# Patient Record
Sex: Female | Born: 1940
Health system: Southern US, Community
[De-identification: ages and names within clinical notes are randomized; demographics above are authoritative.]

## PROBLEM LIST (undated history)

## (undated) DIAGNOSIS — R011 Cardiac murmur, unspecified: Secondary | ICD-10-CM

## (undated) DIAGNOSIS — K222 Esophageal obstruction: Secondary | ICD-10-CM

## (undated) DIAGNOSIS — K219 Gastro-esophageal reflux disease without esophagitis: Secondary | ICD-10-CM

## (undated) DIAGNOSIS — R579 Shock, unspecified: Secondary | ICD-10-CM

## (undated) DIAGNOSIS — H353190 Nonexudative age-related macular degeneration, unspecified eye, stage unspecified: Secondary | ICD-10-CM

## (undated) DIAGNOSIS — N189 Chronic kidney disease, unspecified: Secondary | ICD-10-CM

## (undated) DIAGNOSIS — I471 Supraventricular tachycardia, unspecified: Secondary | ICD-10-CM

## (undated) DIAGNOSIS — I2510 Atherosclerotic heart disease of native coronary artery without angina pectoris: Secondary | ICD-10-CM

## (undated) DIAGNOSIS — N179 Acute kidney failure, unspecified: Secondary | ICD-10-CM

## (undated) DIAGNOSIS — R0600 Dyspnea, unspecified: Secondary | ICD-10-CM

## (undated) DIAGNOSIS — J189 Pneumonia, unspecified organism: Secondary | ICD-10-CM

## (undated) DIAGNOSIS — D638 Anemia in other chronic diseases classified elsewhere: Secondary | ICD-10-CM

## (undated) DIAGNOSIS — I509 Heart failure, unspecified: Secondary | ICD-10-CM

## (undated) DIAGNOSIS — E559 Vitamin D deficiency, unspecified: Secondary | ICD-10-CM

## (undated) DIAGNOSIS — I3139 Other pericardial effusion (noninflammatory): Secondary | ICD-10-CM

## (undated) DIAGNOSIS — N183 Chronic kidney disease, stage 3 unspecified: Secondary | ICD-10-CM

## (undated) DIAGNOSIS — F5104 Psychophysiologic insomnia: Secondary | ICD-10-CM

## (undated) DIAGNOSIS — M5030 Other cervical disc degeneration, unspecified cervical region: Secondary | ICD-10-CM

## (undated) DIAGNOSIS — I6990 Unspecified sequelae of unspecified cerebrovascular disease: Secondary | ICD-10-CM

## (undated) DIAGNOSIS — K449 Diaphragmatic hernia without obstruction or gangrene: Secondary | ICD-10-CM

## (undated) DIAGNOSIS — M8580 Other specified disorders of bone density and structure, unspecified site: Secondary | ICD-10-CM

## (undated) DIAGNOSIS — F419 Anxiety disorder, unspecified: Secondary | ICD-10-CM

## (undated) DIAGNOSIS — I499 Cardiac arrhythmia, unspecified: Secondary | ICD-10-CM

## (undated) DIAGNOSIS — G4730 Sleep apnea, unspecified: Secondary | ICD-10-CM

## (undated) DIAGNOSIS — I340 Nonrheumatic mitral (valve) insufficiency: Secondary | ICD-10-CM

## (undated) DIAGNOSIS — I10 Essential (primary) hypertension: Secondary | ICD-10-CM

## (undated) DIAGNOSIS — M810 Age-related osteoporosis without current pathological fracture: Secondary | ICD-10-CM

## (undated) DIAGNOSIS — M109 Gout, unspecified: Secondary | ICD-10-CM

## (undated) DIAGNOSIS — J45909 Unspecified asthma, uncomplicated: Secondary | ICD-10-CM

## (undated) HISTORY — DX: Other cervical disc degeneration, unspecified cervical region: M50.30

## (undated) HISTORY — DX: Shock, unspecified: R57.9

## (undated) HISTORY — PX: CATARACT EXTRACTION: SUR2

## (undated) HISTORY — DX: Diaphragmatic hernia without obstruction or gangrene: K44.9

## (undated) HISTORY — DX: Atherosclerotic heart disease of native coronary artery without angina pectoris: I25.10

## (undated) HISTORY — DX: Gastro-esophageal reflux disease without esophagitis: K21.9

## (undated) HISTORY — DX: Esophageal obstruction: K22.2

## (undated) HISTORY — DX: Other specified disorders of bone density and structure, unspecified site: M85.80

## (undated) HISTORY — DX: Nonrheumatic mitral (valve) insufficiency: I34.0

## (undated) HISTORY — DX: Acute kidney failure, unspecified: N17.9

## (undated) HISTORY — DX: Chronic kidney disease, unspecified: N18.9

## (undated) HISTORY — DX: Nonexudative age-related macular degeneration, unspecified eye, stage unspecified: H35.3190

## (undated) HISTORY — DX: Hypercalcemia: E83.52

## (undated) HISTORY — DX: Age-related osteoporosis without current pathological fracture: M81.0

## (undated) HISTORY — PX: CARDIAC CATHETERIZATION: SHX172

## (undated) HISTORY — DX: Chronic kidney disease, stage 3 unspecified: N18.30

## (undated) HISTORY — DX: Supraventricular tachycardia: I47.1

## (undated) HISTORY — DX: Psychophysiologic insomnia: F51.04

## (undated) HISTORY — PX: EYE SURGERY: SHX253

## (undated) HISTORY — PX: ERCP W/ SPHINCTEROTOMY AND BALLOON DILATION: SHX1524

## (undated) HISTORY — DX: Anemia in other chronic diseases classified elsewhere: D63.8

## (undated) HISTORY — DX: Unspecified sequelae of unspecified cerebrovascular disease: I69.90

## (undated) HISTORY — DX: Gout, unspecified: M10.9

## (undated) HISTORY — DX: Supraventricular tachycardia, unspecified: I47.10

## (undated) HISTORY — DX: Other pericardial effusion (noninflammatory): I31.39

## (undated) HISTORY — DX: Vitamin D deficiency, unspecified: E55.9

## (undated) HISTORY — DX: Anxiety disorder, unspecified: F41.9

## (undated) HISTORY — DX: Heart failure, unspecified: I50.9

---

## 2012-05-07 DIAGNOSIS — I6330 Cerebral infarction due to thrombosis of unspecified cerebral artery: Secondary | ICD-10-CM

## 2012-05-07 HISTORY — DX: Cerebral infarction due to thrombosis of unspecified cerebral artery: I63.30

## 2014-08-11 ENCOUNTER — Encounter (INDEPENDENT_AMBULATORY_CARE_PROVIDER_SITE_OTHER): Payer: Self-pay | Admitting: Ophthalmology

## 2014-08-12 ENCOUNTER — Encounter (INDEPENDENT_AMBULATORY_CARE_PROVIDER_SITE_OTHER): Payer: Commercial Managed Care - HMO | Admitting: Ophthalmology

## 2014-08-14 HISTORY — PX: HEMORRHOID SURGERY: SHX153

## 2014-08-17 ENCOUNTER — Encounter (INDEPENDENT_AMBULATORY_CARE_PROVIDER_SITE_OTHER): Payer: Commercial Managed Care - HMO | Admitting: Ophthalmology

## 2014-08-17 DIAGNOSIS — H34831 Tributary (branch) retinal vein occlusion, right eye: Secondary | ICD-10-CM

## 2014-08-17 DIAGNOSIS — H43813 Vitreous degeneration, bilateral: Secondary | ICD-10-CM | POA: Diagnosis not present

## 2014-08-17 DIAGNOSIS — I10 Essential (primary) hypertension: Secondary | ICD-10-CM

## 2014-08-17 DIAGNOSIS — H35033 Hypertensive retinopathy, bilateral: Secondary | ICD-10-CM

## 2014-08-17 DIAGNOSIS — H3531 Nonexudative age-related macular degeneration: Secondary | ICD-10-CM | POA: Diagnosis not present

## 2014-09-24 DIAGNOSIS — R531 Weakness: Secondary | ICD-10-CM | POA: Diagnosis not present

## 2014-09-24 DIAGNOSIS — E559 Vitamin D deficiency, unspecified: Secondary | ICD-10-CM | POA: Diagnosis not present

## 2014-09-24 DIAGNOSIS — D509 Iron deficiency anemia, unspecified: Secondary | ICD-10-CM | POA: Diagnosis not present

## 2014-09-24 DIAGNOSIS — F419 Anxiety disorder, unspecified: Secondary | ICD-10-CM | POA: Diagnosis not present

## 2014-09-24 DIAGNOSIS — Z681 Body mass index (BMI) 19 or less, adult: Secondary | ICD-10-CM | POA: Diagnosis not present

## 2014-09-24 DIAGNOSIS — D539 Nutritional anemia, unspecified: Secondary | ICD-10-CM | POA: Diagnosis not present

## 2014-09-24 DIAGNOSIS — R231 Pallor: Secondary | ICD-10-CM | POA: Diagnosis not present

## 2014-09-24 DIAGNOSIS — R17 Unspecified jaundice: Secondary | ICD-10-CM | POA: Diagnosis not present

## 2014-12-21 ENCOUNTER — Ambulatory Visit (INDEPENDENT_AMBULATORY_CARE_PROVIDER_SITE_OTHER): Payer: Commercial Managed Care - HMO | Admitting: Ophthalmology

## 2014-12-21 DIAGNOSIS — I10 Essential (primary) hypertension: Secondary | ICD-10-CM | POA: Diagnosis not present

## 2014-12-21 DIAGNOSIS — H34833 Tributary (branch) retinal vein occlusion, bilateral: Secondary | ICD-10-CM | POA: Diagnosis not present

## 2014-12-21 DIAGNOSIS — H3531 Nonexudative age-related macular degeneration: Secondary | ICD-10-CM | POA: Diagnosis not present

## 2014-12-21 DIAGNOSIS — H2513 Age-related nuclear cataract, bilateral: Secondary | ICD-10-CM | POA: Diagnosis not present

## 2014-12-21 DIAGNOSIS — H43813 Vitreous degeneration, bilateral: Secondary | ICD-10-CM

## 2014-12-21 DIAGNOSIS — H35033 Hypertensive retinopathy, bilateral: Secondary | ICD-10-CM | POA: Diagnosis not present

## 2015-02-16 DIAGNOSIS — Z79899 Other long term (current) drug therapy: Secondary | ICD-10-CM | POA: Diagnosis not present

## 2015-02-16 DIAGNOSIS — Z1389 Encounter for screening for other disorder: Secondary | ICD-10-CM | POA: Diagnosis not present

## 2015-02-16 DIAGNOSIS — I6340 Cerebral infarction due to embolism of unspecified cerebral artery: Secondary | ICD-10-CM | POA: Diagnosis not present

## 2015-02-16 DIAGNOSIS — E785 Hyperlipidemia, unspecified: Secondary | ICD-10-CM | POA: Diagnosis not present

## 2015-02-16 DIAGNOSIS — M8580 Other specified disorders of bone density and structure, unspecified site: Secondary | ICD-10-CM | POA: Diagnosis not present

## 2015-02-16 DIAGNOSIS — Z9181 History of falling: Secondary | ICD-10-CM | POA: Diagnosis not present

## 2015-02-16 DIAGNOSIS — I10 Essential (primary) hypertension: Secondary | ICD-10-CM | POA: Diagnosis not present

## 2015-02-16 DIAGNOSIS — Z139 Encounter for screening, unspecified: Secondary | ICD-10-CM | POA: Diagnosis not present

## 2015-02-16 DIAGNOSIS — I4891 Unspecified atrial fibrillation: Secondary | ICD-10-CM | POA: Diagnosis not present

## 2015-03-08 DIAGNOSIS — F419 Anxiety disorder, unspecified: Secondary | ICD-10-CM | POA: Diagnosis not present

## 2015-03-08 DIAGNOSIS — I10 Essential (primary) hypertension: Secondary | ICD-10-CM | POA: Diagnosis not present

## 2015-03-08 DIAGNOSIS — E785 Hyperlipidemia, unspecified: Secondary | ICD-10-CM | POA: Diagnosis not present

## 2015-03-08 DIAGNOSIS — Z681 Body mass index (BMI) 19 or less, adult: Secondary | ICD-10-CM | POA: Diagnosis not present

## 2015-03-08 DIAGNOSIS — I6340 Cerebral infarction due to embolism of unspecified cerebral artery: Secondary | ICD-10-CM | POA: Diagnosis not present

## 2015-03-08 DIAGNOSIS — I4891 Unspecified atrial fibrillation: Secondary | ICD-10-CM | POA: Diagnosis not present

## 2015-03-19 DIAGNOSIS — Z1231 Encounter for screening mammogram for malignant neoplasm of breast: Secondary | ICD-10-CM | POA: Diagnosis not present

## 2015-04-09 DIAGNOSIS — F419 Anxiety disorder, unspecified: Secondary | ICD-10-CM | POA: Diagnosis not present

## 2015-04-09 DIAGNOSIS — Z1389 Encounter for screening for other disorder: Secondary | ICD-10-CM | POA: Diagnosis not present

## 2015-04-09 DIAGNOSIS — Z681 Body mass index (BMI) 19 or less, adult: Secondary | ICD-10-CM | POA: Diagnosis not present

## 2015-07-23 DIAGNOSIS — R21 Rash and other nonspecific skin eruption: Secondary | ICD-10-CM | POA: Diagnosis not present

## 2015-07-23 DIAGNOSIS — F329 Major depressive disorder, single episode, unspecified: Secondary | ICD-10-CM | POA: Diagnosis not present

## 2015-07-30 DIAGNOSIS — Z23 Encounter for immunization: Secondary | ICD-10-CM | POA: Diagnosis not present

## 2015-08-26 DIAGNOSIS — I4891 Unspecified atrial fibrillation: Secondary | ICD-10-CM | POA: Diagnosis not present

## 2015-08-26 DIAGNOSIS — F419 Anxiety disorder, unspecified: Secondary | ICD-10-CM | POA: Diagnosis not present

## 2015-08-26 DIAGNOSIS — Z23 Encounter for immunization: Secondary | ICD-10-CM | POA: Diagnosis not present

## 2015-08-26 DIAGNOSIS — Z79899 Other long term (current) drug therapy: Secondary | ICD-10-CM | POA: Diagnosis not present

## 2015-08-26 DIAGNOSIS — I10 Essential (primary) hypertension: Secondary | ICD-10-CM | POA: Diagnosis not present

## 2015-08-26 DIAGNOSIS — E785 Hyperlipidemia, unspecified: Secondary | ICD-10-CM | POA: Diagnosis not present

## 2015-08-26 DIAGNOSIS — I6340 Cerebral infarction due to embolism of unspecified cerebral artery: Secondary | ICD-10-CM | POA: Diagnosis not present

## 2015-08-26 DIAGNOSIS — E559 Vitamin D deficiency, unspecified: Secondary | ICD-10-CM | POA: Diagnosis not present

## 2015-08-26 DIAGNOSIS — M8580 Other specified disorders of bone density and structure, unspecified site: Secondary | ICD-10-CM | POA: Diagnosis not present

## 2015-12-22 ENCOUNTER — Ambulatory Visit (INDEPENDENT_AMBULATORY_CARE_PROVIDER_SITE_OTHER): Payer: Commercial Managed Care - HMO | Admitting: Ophthalmology

## 2015-12-27 ENCOUNTER — Ambulatory Visit (INDEPENDENT_AMBULATORY_CARE_PROVIDER_SITE_OTHER): Payer: Commercial Managed Care - HMO | Admitting: Ophthalmology

## 2015-12-27 DIAGNOSIS — H43813 Vitreous degeneration, bilateral: Secondary | ICD-10-CM | POA: Diagnosis not present

## 2015-12-27 DIAGNOSIS — H348312 Tributary (branch) retinal vein occlusion, right eye, stable: Secondary | ICD-10-CM | POA: Diagnosis not present

## 2015-12-27 DIAGNOSIS — H35033 Hypertensive retinopathy, bilateral: Secondary | ICD-10-CM

## 2015-12-27 DIAGNOSIS — H2513 Age-related nuclear cataract, bilateral: Secondary | ICD-10-CM

## 2015-12-27 DIAGNOSIS — I10 Essential (primary) hypertension: Secondary | ICD-10-CM | POA: Diagnosis not present

## 2015-12-27 DIAGNOSIS — H353132 Nonexudative age-related macular degeneration, bilateral, intermediate dry stage: Secondary | ICD-10-CM | POA: Diagnosis not present

## 2016-01-28 DIAGNOSIS — H5203 Hypermetropia, bilateral: Secondary | ICD-10-CM | POA: Diagnosis not present

## 2016-01-28 DIAGNOSIS — H5210 Myopia, unspecified eye: Secondary | ICD-10-CM | POA: Diagnosis not present

## 2016-02-29 DIAGNOSIS — M50322 Other cervical disc degeneration at C5-C6 level: Secondary | ICD-10-CM | POA: Diagnosis not present

## 2016-02-29 DIAGNOSIS — M50323 Other cervical disc degeneration at C6-C7 level: Secondary | ICD-10-CM | POA: Diagnosis not present

## 2016-02-29 DIAGNOSIS — I4891 Unspecified atrial fibrillation: Secondary | ICD-10-CM | POA: Diagnosis not present

## 2016-02-29 DIAGNOSIS — M8580 Other specified disorders of bone density and structure, unspecified site: Secondary | ICD-10-CM | POA: Diagnosis not present

## 2016-02-29 DIAGNOSIS — I6340 Cerebral infarction due to embolism of unspecified cerebral artery: Secondary | ICD-10-CM | POA: Diagnosis not present

## 2016-02-29 DIAGNOSIS — E559 Vitamin D deficiency, unspecified: Secondary | ICD-10-CM | POA: Diagnosis not present

## 2016-02-29 DIAGNOSIS — Z79899 Other long term (current) drug therapy: Secondary | ICD-10-CM | POA: Diagnosis not present

## 2016-02-29 DIAGNOSIS — R0600 Dyspnea, unspecified: Secondary | ICD-10-CM | POA: Diagnosis not present

## 2016-02-29 DIAGNOSIS — R918 Other nonspecific abnormal finding of lung field: Secondary | ICD-10-CM | POA: Diagnosis not present

## 2016-02-29 DIAGNOSIS — Z681 Body mass index (BMI) 19 or less, adult: Secondary | ICD-10-CM | POA: Diagnosis not present

## 2016-02-29 DIAGNOSIS — I10 Essential (primary) hypertension: Secondary | ICD-10-CM | POA: Diagnosis not present

## 2016-02-29 DIAGNOSIS — E785 Hyperlipidemia, unspecified: Secondary | ICD-10-CM | POA: Diagnosis not present

## 2016-02-29 DIAGNOSIS — M542 Cervicalgia: Secondary | ICD-10-CM | POA: Diagnosis not present

## 2016-03-07 DIAGNOSIS — M542 Cervicalgia: Secondary | ICD-10-CM | POA: Diagnosis not present

## 2016-03-07 DIAGNOSIS — M5030 Other cervical disc degeneration, unspecified cervical region: Secondary | ICD-10-CM | POA: Diagnosis not present

## 2016-03-09 DIAGNOSIS — M542 Cervicalgia: Secondary | ICD-10-CM | POA: Diagnosis not present

## 2016-03-09 DIAGNOSIS — M5030 Other cervical disc degeneration, unspecified cervical region: Secondary | ICD-10-CM | POA: Diagnosis not present

## 2016-03-14 DIAGNOSIS — M5030 Other cervical disc degeneration, unspecified cervical region: Secondary | ICD-10-CM | POA: Diagnosis not present

## 2016-03-14 DIAGNOSIS — M542 Cervicalgia: Secondary | ICD-10-CM | POA: Diagnosis not present

## 2016-03-16 DIAGNOSIS — M5030 Other cervical disc degeneration, unspecified cervical region: Secondary | ICD-10-CM | POA: Diagnosis not present

## 2016-03-16 DIAGNOSIS — M542 Cervicalgia: Secondary | ICD-10-CM | POA: Diagnosis not present

## 2016-03-20 DIAGNOSIS — Z1231 Encounter for screening mammogram for malignant neoplasm of breast: Secondary | ICD-10-CM | POA: Diagnosis not present

## 2016-03-21 DIAGNOSIS — I10 Essential (primary) hypertension: Secondary | ICD-10-CM | POA: Diagnosis not present

## 2016-03-21 DIAGNOSIS — M5030 Other cervical disc degeneration, unspecified cervical region: Secondary | ICD-10-CM | POA: Diagnosis not present

## 2016-03-21 DIAGNOSIS — R636 Underweight: Secondary | ICD-10-CM | POA: Diagnosis not present

## 2016-03-21 DIAGNOSIS — Z681 Body mass index (BMI) 19 or less, adult: Secondary | ICD-10-CM | POA: Diagnosis not present

## 2016-03-21 DIAGNOSIS — J189 Pneumonia, unspecified organism: Secondary | ICD-10-CM | POA: Diagnosis not present

## 2016-03-23 DIAGNOSIS — M542 Cervicalgia: Secondary | ICD-10-CM | POA: Diagnosis not present

## 2016-03-23 DIAGNOSIS — M5030 Other cervical disc degeneration, unspecified cervical region: Secondary | ICD-10-CM | POA: Diagnosis not present

## 2016-03-28 DIAGNOSIS — M5030 Other cervical disc degeneration, unspecified cervical region: Secondary | ICD-10-CM | POA: Diagnosis not present

## 2016-03-28 DIAGNOSIS — M542 Cervicalgia: Secondary | ICD-10-CM | POA: Diagnosis not present

## 2016-03-30 DIAGNOSIS — M5030 Other cervical disc degeneration, unspecified cervical region: Secondary | ICD-10-CM | POA: Diagnosis not present

## 2016-03-30 DIAGNOSIS — M542 Cervicalgia: Secondary | ICD-10-CM | POA: Diagnosis not present

## 2016-04-04 DIAGNOSIS — M5030 Other cervical disc degeneration, unspecified cervical region: Secondary | ICD-10-CM | POA: Diagnosis not present

## 2016-04-04 DIAGNOSIS — M542 Cervicalgia: Secondary | ICD-10-CM | POA: Diagnosis not present

## 2016-04-06 DIAGNOSIS — M542 Cervicalgia: Secondary | ICD-10-CM | POA: Diagnosis not present

## 2016-04-06 DIAGNOSIS — M5030 Other cervical disc degeneration, unspecified cervical region: Secondary | ICD-10-CM | POA: Diagnosis not present

## 2016-04-11 DIAGNOSIS — M542 Cervicalgia: Secondary | ICD-10-CM | POA: Diagnosis not present

## 2016-04-11 DIAGNOSIS — M5030 Other cervical disc degeneration, unspecified cervical region: Secondary | ICD-10-CM | POA: Diagnosis not present

## 2016-04-14 DIAGNOSIS — M5030 Other cervical disc degeneration, unspecified cervical region: Secondary | ICD-10-CM | POA: Diagnosis not present

## 2016-04-14 DIAGNOSIS — M542 Cervicalgia: Secondary | ICD-10-CM | POA: Diagnosis not present

## 2016-04-19 DIAGNOSIS — M5030 Other cervical disc degeneration, unspecified cervical region: Secondary | ICD-10-CM | POA: Diagnosis not present

## 2016-04-19 DIAGNOSIS — M542 Cervicalgia: Secondary | ICD-10-CM | POA: Diagnosis not present

## 2016-05-04 DIAGNOSIS — M5030 Other cervical disc degeneration, unspecified cervical region: Secondary | ICD-10-CM | POA: Diagnosis not present

## 2016-05-04 DIAGNOSIS — Z681 Body mass index (BMI) 19 or less, adult: Secondary | ICD-10-CM | POA: Diagnosis not present

## 2016-05-09 DIAGNOSIS — M4802 Spinal stenosis, cervical region: Secondary | ICD-10-CM | POA: Diagnosis not present

## 2016-05-09 DIAGNOSIS — M5030 Other cervical disc degeneration, unspecified cervical region: Secondary | ICD-10-CM | POA: Diagnosis not present

## 2016-05-10 DIAGNOSIS — M5412 Radiculopathy, cervical region: Secondary | ICD-10-CM | POA: Diagnosis not present

## 2016-05-10 DIAGNOSIS — M7540 Impingement syndrome of unspecified shoulder: Secondary | ICD-10-CM | POA: Diagnosis not present

## 2016-06-21 DIAGNOSIS — M5412 Radiculopathy, cervical region: Secondary | ICD-10-CM | POA: Diagnosis not present

## 2016-07-11 DIAGNOSIS — M791 Myalgia: Secondary | ICD-10-CM | POA: Diagnosis not present

## 2016-07-11 DIAGNOSIS — J208 Acute bronchitis due to other specified organisms: Secondary | ICD-10-CM | POA: Diagnosis not present

## 2016-07-11 DIAGNOSIS — R110 Nausea: Secondary | ICD-10-CM | POA: Diagnosis not present

## 2016-07-11 DIAGNOSIS — D509 Iron deficiency anemia, unspecified: Secondary | ICD-10-CM | POA: Diagnosis not present

## 2016-07-11 DIAGNOSIS — Z681 Body mass index (BMI) 19 or less, adult: Secondary | ICD-10-CM | POA: Diagnosis not present

## 2016-07-14 DIAGNOSIS — M50323 Other cervical disc degeneration at C6-C7 level: Secondary | ICD-10-CM | POA: Diagnosis not present

## 2016-07-14 DIAGNOSIS — M50322 Other cervical disc degeneration at C5-C6 level: Secondary | ICD-10-CM | POA: Diagnosis not present

## 2016-07-14 DIAGNOSIS — M5412 Radiculopathy, cervical region: Secondary | ICD-10-CM | POA: Diagnosis not present

## 2016-08-01 DIAGNOSIS — J208 Acute bronchitis due to other specified organisms: Secondary | ICD-10-CM | POA: Diagnosis not present

## 2016-08-01 DIAGNOSIS — Z681 Body mass index (BMI) 19 or less, adult: Secondary | ICD-10-CM | POA: Diagnosis not present

## 2016-08-02 DIAGNOSIS — J40 Bronchitis, not specified as acute or chronic: Secondary | ICD-10-CM | POA: Diagnosis not present

## 2016-08-02 DIAGNOSIS — J208 Acute bronchitis due to other specified organisms: Secondary | ICD-10-CM | POA: Diagnosis not present

## 2016-08-10 DIAGNOSIS — J208 Acute bronchitis due to other specified organisms: Secondary | ICD-10-CM | POA: Diagnosis not present

## 2016-08-10 DIAGNOSIS — I5032 Chronic diastolic (congestive) heart failure: Secondary | ICD-10-CM | POA: Diagnosis not present

## 2016-08-10 DIAGNOSIS — Z681 Body mass index (BMI) 19 or less, adult: Secondary | ICD-10-CM | POA: Diagnosis not present

## 2016-08-10 DIAGNOSIS — I4891 Unspecified atrial fibrillation: Secondary | ICD-10-CM | POA: Diagnosis not present

## 2016-08-10 DIAGNOSIS — E538 Deficiency of other specified B group vitamins: Secondary | ICD-10-CM | POA: Diagnosis not present

## 2016-08-10 DIAGNOSIS — R0600 Dyspnea, unspecified: Secondary | ICD-10-CM | POA: Diagnosis not present

## 2016-08-10 DIAGNOSIS — Z79899 Other long term (current) drug therapy: Secondary | ICD-10-CM | POA: Diagnosis not present

## 2016-08-10 DIAGNOSIS — R5383 Other fatigue: Secondary | ICD-10-CM | POA: Diagnosis not present

## 2016-08-16 DIAGNOSIS — R05 Cough: Secondary | ICD-10-CM | POA: Diagnosis not present

## 2016-08-16 DIAGNOSIS — I5032 Chronic diastolic (congestive) heart failure: Secondary | ICD-10-CM | POA: Diagnosis not present

## 2016-08-16 DIAGNOSIS — M5412 Radiculopathy, cervical region: Secondary | ICD-10-CM | POA: Diagnosis not present

## 2016-08-16 DIAGNOSIS — Z681 Body mass index (BMI) 19 or less, adult: Secondary | ICD-10-CM | POA: Diagnosis not present

## 2016-08-16 DIAGNOSIS — I517 Cardiomegaly: Secondary | ICD-10-CM | POA: Diagnosis not present

## 2016-08-16 DIAGNOSIS — S301XXA Contusion of abdominal wall, initial encounter: Secondary | ICD-10-CM | POA: Diagnosis not present

## 2016-08-23 DIAGNOSIS — I6523 Occlusion and stenosis of bilateral carotid arteries: Secondary | ICD-10-CM | POA: Diagnosis not present

## 2016-08-23 DIAGNOSIS — Z8673 Personal history of transient ischemic attack (TIA), and cerebral infarction without residual deficits: Secondary | ICD-10-CM | POA: Diagnosis not present

## 2016-08-23 DIAGNOSIS — I482 Chronic atrial fibrillation: Secondary | ICD-10-CM | POA: Diagnosis not present

## 2016-08-23 DIAGNOSIS — I10 Essential (primary) hypertension: Secondary | ICD-10-CM | POA: Diagnosis not present

## 2016-08-23 DIAGNOSIS — R079 Chest pain, unspecified: Secondary | ICD-10-CM | POA: Diagnosis not present

## 2016-08-23 DIAGNOSIS — R29810 Facial weakness: Secondary | ICD-10-CM | POA: Diagnosis not present

## 2016-08-23 DIAGNOSIS — I6350 Cerebral infarction due to unspecified occlusion or stenosis of unspecified cerebral artery: Secondary | ICD-10-CM | POA: Diagnosis not present

## 2016-08-23 DIAGNOSIS — R4781 Slurred speech: Secondary | ICD-10-CM | POA: Diagnosis not present

## 2016-08-23 DIAGNOSIS — I110 Hypertensive heart disease with heart failure: Secondary | ICD-10-CM | POA: Diagnosis not present

## 2016-08-23 DIAGNOSIS — I4891 Unspecified atrial fibrillation: Secondary | ICD-10-CM | POA: Diagnosis not present

## 2016-08-23 DIAGNOSIS — E785 Hyperlipidemia, unspecified: Secondary | ICD-10-CM | POA: Diagnosis not present

## 2016-08-23 DIAGNOSIS — I671 Cerebral aneurysm, nonruptured: Secondary | ICD-10-CM | POA: Diagnosis not present

## 2016-08-23 DIAGNOSIS — I252 Old myocardial infarction: Secondary | ICD-10-CM | POA: Diagnosis not present

## 2016-08-23 DIAGNOSIS — I6789 Other cerebrovascular disease: Secondary | ICD-10-CM | POA: Diagnosis not present

## 2016-08-23 DIAGNOSIS — R7989 Other specified abnormal findings of blood chemistry: Secondary | ICD-10-CM | POA: Diagnosis not present

## 2016-08-23 DIAGNOSIS — I639 Cerebral infarction, unspecified: Secondary | ICD-10-CM | POA: Diagnosis not present

## 2016-08-23 DIAGNOSIS — I509 Heart failure, unspecified: Secondary | ICD-10-CM | POA: Diagnosis not present

## 2016-08-23 DIAGNOSIS — Z66 Do not resuscitate: Secondary | ICD-10-CM | POA: Diagnosis not present

## 2016-08-28 DIAGNOSIS — R0600 Dyspnea, unspecified: Secondary | ICD-10-CM | POA: Insufficient documentation

## 2016-08-28 DIAGNOSIS — E785 Hyperlipidemia, unspecified: Secondary | ICD-10-CM | POA: Diagnosis not present

## 2016-08-28 DIAGNOSIS — J189 Pneumonia, unspecified organism: Secondary | ICD-10-CM | POA: Insufficient documentation

## 2016-08-28 DIAGNOSIS — I482 Chronic atrial fibrillation, unspecified: Secondary | ICD-10-CM

## 2016-08-28 DIAGNOSIS — Z79899 Other long term (current) drug therapy: Secondary | ICD-10-CM

## 2016-08-28 DIAGNOSIS — I10 Essential (primary) hypertension: Secondary | ICD-10-CM | POA: Insufficient documentation

## 2016-08-28 DIAGNOSIS — I119 Hypertensive heart disease without heart failure: Secondary | ICD-10-CM | POA: Insufficient documentation

## 2016-08-28 DIAGNOSIS — I509 Heart failure, unspecified: Secondary | ICD-10-CM | POA: Diagnosis not present

## 2016-08-28 DIAGNOSIS — I6350 Cerebral infarction due to unspecified occlusion or stenosis of unspecified cerebral artery: Secondary | ICD-10-CM | POA: Diagnosis not present

## 2016-08-28 DIAGNOSIS — R7989 Other specified abnormal findings of blood chemistry: Secondary | ICD-10-CM | POA: Diagnosis not present

## 2016-08-28 DIAGNOSIS — Z7901 Long term (current) use of anticoagulants: Secondary | ICD-10-CM

## 2016-08-28 DIAGNOSIS — I110 Hypertensive heart disease with heart failure: Secondary | ICD-10-CM | POA: Diagnosis not present

## 2016-08-28 DIAGNOSIS — I252 Old myocardial infarction: Secondary | ICD-10-CM | POA: Diagnosis not present

## 2016-08-28 DIAGNOSIS — Z66 Do not resuscitate: Secondary | ICD-10-CM | POA: Diagnosis not present

## 2016-08-28 DIAGNOSIS — Z8673 Personal history of transient ischemic attack (TIA), and cerebral infarction without residual deficits: Secondary | ICD-10-CM | POA: Diagnosis not present

## 2016-08-28 DIAGNOSIS — E7849 Other hyperlipidemia: Secondary | ICD-10-CM

## 2016-08-28 HISTORY — DX: Hypertensive heart disease without heart failure: I11.9

## 2016-08-28 HISTORY — DX: Other hyperlipidemia: E78.49

## 2016-08-28 HISTORY — DX: Other long term (current) drug therapy: Z79.899

## 2016-08-28 HISTORY — DX: Chronic atrial fibrillation, unspecified: I48.20

## 2016-08-28 HISTORY — DX: Long term (current) use of anticoagulants: Z79.01

## 2016-08-29 DIAGNOSIS — Z79899 Other long term (current) drug therapy: Secondary | ICD-10-CM | POA: Diagnosis not present

## 2016-08-29 DIAGNOSIS — I69322 Dysarthria following cerebral infarction: Secondary | ICD-10-CM | POA: Diagnosis not present

## 2016-08-29 DIAGNOSIS — E785 Hyperlipidemia, unspecified: Secondary | ICD-10-CM | POA: Diagnosis not present

## 2016-08-29 DIAGNOSIS — Z5181 Encounter for therapeutic drug level monitoring: Secondary | ICD-10-CM | POA: Diagnosis not present

## 2016-08-29 DIAGNOSIS — F329 Major depressive disorder, single episode, unspecified: Secondary | ICD-10-CM | POA: Diagnosis not present

## 2016-08-29 DIAGNOSIS — I10 Essential (primary) hypertension: Secondary | ICD-10-CM | POA: Diagnosis not present

## 2016-08-29 DIAGNOSIS — I4891 Unspecified atrial fibrillation: Secondary | ICD-10-CM | POA: Diagnosis not present

## 2016-08-29 DIAGNOSIS — Z7901 Long term (current) use of anticoagulants: Secondary | ICD-10-CM | POA: Diagnosis not present

## 2016-08-29 DIAGNOSIS — I509 Heart failure, unspecified: Secondary | ICD-10-CM | POA: Diagnosis not present

## 2016-08-29 DIAGNOSIS — R1312 Dysphagia, oropharyngeal phase: Secondary | ICD-10-CM | POA: Diagnosis not present

## 2016-08-29 DIAGNOSIS — I69391 Dysphagia following cerebral infarction: Secondary | ICD-10-CM | POA: Diagnosis not present

## 2016-08-29 DIAGNOSIS — I482 Chronic atrial fibrillation: Secondary | ICD-10-CM | POA: Diagnosis not present

## 2016-09-01 DIAGNOSIS — I69322 Dysarthria following cerebral infarction: Secondary | ICD-10-CM | POA: Diagnosis not present

## 2016-09-01 DIAGNOSIS — E785 Hyperlipidemia, unspecified: Secondary | ICD-10-CM | POA: Diagnosis not present

## 2016-09-01 DIAGNOSIS — I5032 Chronic diastolic (congestive) heart failure: Secondary | ICD-10-CM | POA: Diagnosis not present

## 2016-09-01 DIAGNOSIS — I10 Essential (primary) hypertension: Secondary | ICD-10-CM | POA: Diagnosis not present

## 2016-09-01 DIAGNOSIS — I4891 Unspecified atrial fibrillation: Secondary | ICD-10-CM | POA: Diagnosis not present

## 2016-09-01 DIAGNOSIS — I69391 Dysphagia following cerebral infarction: Secondary | ICD-10-CM | POA: Diagnosis not present

## 2016-09-01 DIAGNOSIS — Z79899 Other long term (current) drug therapy: Secondary | ICD-10-CM | POA: Diagnosis not present

## 2016-09-01 DIAGNOSIS — R791 Abnormal coagulation profile: Secondary | ICD-10-CM | POA: Diagnosis not present

## 2016-09-01 DIAGNOSIS — Z5181 Encounter for therapeutic drug level monitoring: Secondary | ICD-10-CM | POA: Diagnosis not present

## 2016-09-01 DIAGNOSIS — I482 Chronic atrial fibrillation: Secondary | ICD-10-CM | POA: Diagnosis not present

## 2016-09-01 DIAGNOSIS — F329 Major depressive disorder, single episode, unspecified: Secondary | ICD-10-CM | POA: Diagnosis not present

## 2016-09-01 DIAGNOSIS — Z681 Body mass index (BMI) 19 or less, adult: Secondary | ICD-10-CM | POA: Diagnosis not present

## 2016-09-01 DIAGNOSIS — I63511 Cerebral infarction due to unspecified occlusion or stenosis of right middle cerebral artery: Secondary | ICD-10-CM | POA: Diagnosis not present

## 2016-09-01 DIAGNOSIS — R1312 Dysphagia, oropharyngeal phase: Secondary | ICD-10-CM | POA: Diagnosis not present

## 2016-09-01 DIAGNOSIS — Z7901 Long term (current) use of anticoagulants: Secondary | ICD-10-CM | POA: Diagnosis not present

## 2016-09-04 DIAGNOSIS — F329 Major depressive disorder, single episode, unspecified: Secondary | ICD-10-CM | POA: Diagnosis not present

## 2016-09-04 DIAGNOSIS — I10 Essential (primary) hypertension: Secondary | ICD-10-CM | POA: Diagnosis not present

## 2016-09-04 DIAGNOSIS — I63511 Cerebral infarction due to unspecified occlusion or stenosis of right middle cerebral artery: Secondary | ICD-10-CM | POA: Diagnosis not present

## 2016-09-04 DIAGNOSIS — I4891 Unspecified atrial fibrillation: Secondary | ICD-10-CM | POA: Diagnosis not present

## 2016-09-04 DIAGNOSIS — Z5181 Encounter for therapeutic drug level monitoring: Secondary | ICD-10-CM | POA: Diagnosis not present

## 2016-09-04 DIAGNOSIS — I482 Chronic atrial fibrillation: Secondary | ICD-10-CM | POA: Diagnosis not present

## 2016-09-04 DIAGNOSIS — R1312 Dysphagia, oropharyngeal phase: Secondary | ICD-10-CM | POA: Diagnosis not present

## 2016-09-04 DIAGNOSIS — R791 Abnormal coagulation profile: Secondary | ICD-10-CM | POA: Diagnosis not present

## 2016-09-04 DIAGNOSIS — E785 Hyperlipidemia, unspecified: Secondary | ICD-10-CM | POA: Diagnosis not present

## 2016-09-04 DIAGNOSIS — K5900 Constipation, unspecified: Secondary | ICD-10-CM | POA: Diagnosis not present

## 2016-09-04 DIAGNOSIS — Z681 Body mass index (BMI) 19 or less, adult: Secondary | ICD-10-CM | POA: Diagnosis not present

## 2016-09-04 DIAGNOSIS — Z7901 Long term (current) use of anticoagulants: Secondary | ICD-10-CM | POA: Diagnosis not present

## 2016-09-04 DIAGNOSIS — I69391 Dysphagia following cerebral infarction: Secondary | ICD-10-CM | POA: Diagnosis not present

## 2016-09-04 DIAGNOSIS — I69322 Dysarthria following cerebral infarction: Secondary | ICD-10-CM | POA: Diagnosis not present

## 2016-09-05 DIAGNOSIS — R791 Abnormal coagulation profile: Secondary | ICD-10-CM | POA: Diagnosis not present

## 2016-09-06 DIAGNOSIS — Z7901 Long term (current) use of anticoagulants: Secondary | ICD-10-CM | POA: Diagnosis not present

## 2016-09-06 DIAGNOSIS — I482 Chronic atrial fibrillation: Secondary | ICD-10-CM | POA: Diagnosis not present

## 2016-09-06 DIAGNOSIS — F329 Major depressive disorder, single episode, unspecified: Secondary | ICD-10-CM | POA: Diagnosis not present

## 2016-09-06 DIAGNOSIS — Z5181 Encounter for therapeutic drug level monitoring: Secondary | ICD-10-CM | POA: Diagnosis not present

## 2016-09-06 DIAGNOSIS — I69322 Dysarthria following cerebral infarction: Secondary | ICD-10-CM | POA: Diagnosis not present

## 2016-09-06 DIAGNOSIS — I10 Essential (primary) hypertension: Secondary | ICD-10-CM | POA: Diagnosis not present

## 2016-09-06 DIAGNOSIS — R1312 Dysphagia, oropharyngeal phase: Secondary | ICD-10-CM | POA: Diagnosis not present

## 2016-09-06 DIAGNOSIS — E785 Hyperlipidemia, unspecified: Secondary | ICD-10-CM | POA: Diagnosis not present

## 2016-09-06 DIAGNOSIS — I69391 Dysphagia following cerebral infarction: Secondary | ICD-10-CM | POA: Diagnosis not present

## 2016-09-07 DIAGNOSIS — I482 Chronic atrial fibrillation: Secondary | ICD-10-CM | POA: Diagnosis not present

## 2016-09-07 DIAGNOSIS — F329 Major depressive disorder, single episode, unspecified: Secondary | ICD-10-CM | POA: Diagnosis not present

## 2016-09-07 DIAGNOSIS — R1312 Dysphagia, oropharyngeal phase: Secondary | ICD-10-CM | POA: Diagnosis not present

## 2016-09-07 DIAGNOSIS — E785 Hyperlipidemia, unspecified: Secondary | ICD-10-CM | POA: Diagnosis not present

## 2016-09-07 DIAGNOSIS — Z7901 Long term (current) use of anticoagulants: Secondary | ICD-10-CM | POA: Diagnosis not present

## 2016-09-07 DIAGNOSIS — I10 Essential (primary) hypertension: Secondary | ICD-10-CM | POA: Diagnosis not present

## 2016-09-07 DIAGNOSIS — I69322 Dysarthria following cerebral infarction: Secondary | ICD-10-CM | POA: Diagnosis not present

## 2016-09-07 DIAGNOSIS — I69391 Dysphagia following cerebral infarction: Secondary | ICD-10-CM | POA: Diagnosis not present

## 2016-09-07 DIAGNOSIS — Z5181 Encounter for therapeutic drug level monitoring: Secondary | ICD-10-CM | POA: Diagnosis not present

## 2016-09-08 DIAGNOSIS — Z5181 Encounter for therapeutic drug level monitoring: Secondary | ICD-10-CM | POA: Diagnosis not present

## 2016-09-08 DIAGNOSIS — Z7901 Long term (current) use of anticoagulants: Secondary | ICD-10-CM | POA: Diagnosis not present

## 2016-09-08 DIAGNOSIS — I482 Chronic atrial fibrillation: Secondary | ICD-10-CM | POA: Diagnosis not present

## 2016-09-08 DIAGNOSIS — I69322 Dysarthria following cerebral infarction: Secondary | ICD-10-CM | POA: Diagnosis not present

## 2016-09-08 DIAGNOSIS — I69391 Dysphagia following cerebral infarction: Secondary | ICD-10-CM | POA: Diagnosis not present

## 2016-09-08 DIAGNOSIS — R1312 Dysphagia, oropharyngeal phase: Secondary | ICD-10-CM | POA: Diagnosis not present

## 2016-09-08 DIAGNOSIS — E785 Hyperlipidemia, unspecified: Secondary | ICD-10-CM | POA: Diagnosis not present

## 2016-09-08 DIAGNOSIS — F329 Major depressive disorder, single episode, unspecified: Secondary | ICD-10-CM | POA: Diagnosis not present

## 2016-09-08 DIAGNOSIS — I10 Essential (primary) hypertension: Secondary | ICD-10-CM | POA: Diagnosis not present

## 2016-09-09 DIAGNOSIS — R791 Abnormal coagulation profile: Secondary | ICD-10-CM | POA: Diagnosis not present

## 2016-09-11 DIAGNOSIS — I69391 Dysphagia following cerebral infarction: Secondary | ICD-10-CM | POA: Diagnosis not present

## 2016-09-11 DIAGNOSIS — Z5181 Encounter for therapeutic drug level monitoring: Secondary | ICD-10-CM | POA: Diagnosis not present

## 2016-09-11 DIAGNOSIS — Z7901 Long term (current) use of anticoagulants: Secondary | ICD-10-CM | POA: Diagnosis not present

## 2016-09-11 DIAGNOSIS — R1312 Dysphagia, oropharyngeal phase: Secondary | ICD-10-CM | POA: Diagnosis not present

## 2016-09-11 DIAGNOSIS — E785 Hyperlipidemia, unspecified: Secondary | ICD-10-CM | POA: Diagnosis not present

## 2016-09-11 DIAGNOSIS — I69322 Dysarthria following cerebral infarction: Secondary | ICD-10-CM | POA: Diagnosis not present

## 2016-09-11 DIAGNOSIS — I10 Essential (primary) hypertension: Secondary | ICD-10-CM | POA: Diagnosis not present

## 2016-09-11 DIAGNOSIS — F329 Major depressive disorder, single episode, unspecified: Secondary | ICD-10-CM | POA: Diagnosis not present

## 2016-09-11 DIAGNOSIS — I482 Chronic atrial fibrillation: Secondary | ICD-10-CM | POA: Diagnosis not present

## 2016-09-12 DIAGNOSIS — I480 Paroxysmal atrial fibrillation: Secondary | ICD-10-CM | POA: Diagnosis not present

## 2016-09-12 DIAGNOSIS — E785 Hyperlipidemia, unspecified: Secondary | ICD-10-CM | POA: Diagnosis not present

## 2016-09-12 DIAGNOSIS — I69322 Dysarthria following cerebral infarction: Secondary | ICD-10-CM | POA: Diagnosis not present

## 2016-09-12 DIAGNOSIS — I482 Chronic atrial fibrillation: Secondary | ICD-10-CM | POA: Diagnosis not present

## 2016-09-12 DIAGNOSIS — Z5181 Encounter for therapeutic drug level monitoring: Secondary | ICD-10-CM | POA: Diagnosis not present

## 2016-09-12 DIAGNOSIS — F329 Major depressive disorder, single episode, unspecified: Secondary | ICD-10-CM | POA: Diagnosis not present

## 2016-09-12 DIAGNOSIS — Z7901 Long term (current) use of anticoagulants: Secondary | ICD-10-CM | POA: Diagnosis not present

## 2016-09-12 DIAGNOSIS — I69391 Dysphagia following cerebral infarction: Secondary | ICD-10-CM | POA: Diagnosis not present

## 2016-09-12 DIAGNOSIS — R1312 Dysphagia, oropharyngeal phase: Secondary | ICD-10-CM | POA: Diagnosis not present

## 2016-09-12 DIAGNOSIS — I10 Essential (primary) hypertension: Secondary | ICD-10-CM | POA: Diagnosis not present

## 2016-09-13 DIAGNOSIS — Z7901 Long term (current) use of anticoagulants: Secondary | ICD-10-CM | POA: Diagnosis not present

## 2016-09-13 DIAGNOSIS — I482 Chronic atrial fibrillation: Secondary | ICD-10-CM | POA: Diagnosis not present

## 2016-09-13 DIAGNOSIS — I480 Paroxysmal atrial fibrillation: Secondary | ICD-10-CM | POA: Diagnosis not present

## 2016-09-13 DIAGNOSIS — E785 Hyperlipidemia, unspecified: Secondary | ICD-10-CM | POA: Diagnosis not present

## 2016-09-13 DIAGNOSIS — Z5181 Encounter for therapeutic drug level monitoring: Secondary | ICD-10-CM | POA: Diagnosis not present

## 2016-09-13 DIAGNOSIS — I69391 Dysphagia following cerebral infarction: Secondary | ICD-10-CM | POA: Diagnosis not present

## 2016-09-13 DIAGNOSIS — R1312 Dysphagia, oropharyngeal phase: Secondary | ICD-10-CM | POA: Diagnosis not present

## 2016-09-13 DIAGNOSIS — I69322 Dysarthria following cerebral infarction: Secondary | ICD-10-CM | POA: Diagnosis not present

## 2016-09-13 DIAGNOSIS — I10 Essential (primary) hypertension: Secondary | ICD-10-CM | POA: Diagnosis not present

## 2016-09-13 DIAGNOSIS — F329 Major depressive disorder, single episode, unspecified: Secondary | ICD-10-CM | POA: Diagnosis not present

## 2016-09-14 DIAGNOSIS — Z5181 Encounter for therapeutic drug level monitoring: Secondary | ICD-10-CM | POA: Diagnosis not present

## 2016-09-14 DIAGNOSIS — I69391 Dysphagia following cerebral infarction: Secondary | ICD-10-CM | POA: Diagnosis not present

## 2016-09-14 DIAGNOSIS — F329 Major depressive disorder, single episode, unspecified: Secondary | ICD-10-CM | POA: Diagnosis not present

## 2016-09-14 DIAGNOSIS — I482 Chronic atrial fibrillation: Secondary | ICD-10-CM | POA: Diagnosis not present

## 2016-09-14 DIAGNOSIS — I69322 Dysarthria following cerebral infarction: Secondary | ICD-10-CM | POA: Diagnosis not present

## 2016-09-14 DIAGNOSIS — E785 Hyperlipidemia, unspecified: Secondary | ICD-10-CM | POA: Diagnosis not present

## 2016-09-14 DIAGNOSIS — R1312 Dysphagia, oropharyngeal phase: Secondary | ICD-10-CM | POA: Diagnosis not present

## 2016-09-14 DIAGNOSIS — I10 Essential (primary) hypertension: Secondary | ICD-10-CM | POA: Diagnosis not present

## 2016-09-14 DIAGNOSIS — Z7901 Long term (current) use of anticoagulants: Secondary | ICD-10-CM | POA: Diagnosis not present

## 2016-09-14 DIAGNOSIS — I480 Paroxysmal atrial fibrillation: Secondary | ICD-10-CM | POA: Diagnosis not present

## 2016-09-15 DIAGNOSIS — Z5181 Encounter for therapeutic drug level monitoring: Secondary | ICD-10-CM | POA: Diagnosis not present

## 2016-09-15 DIAGNOSIS — R1312 Dysphagia, oropharyngeal phase: Secondary | ICD-10-CM | POA: Diagnosis not present

## 2016-09-15 DIAGNOSIS — I482 Chronic atrial fibrillation: Secondary | ICD-10-CM | POA: Diagnosis not present

## 2016-09-15 DIAGNOSIS — I480 Paroxysmal atrial fibrillation: Secondary | ICD-10-CM | POA: Diagnosis not present

## 2016-09-15 DIAGNOSIS — I69322 Dysarthria following cerebral infarction: Secondary | ICD-10-CM | POA: Diagnosis not present

## 2016-09-15 DIAGNOSIS — I69391 Dysphagia following cerebral infarction: Secondary | ICD-10-CM | POA: Diagnosis not present

## 2016-09-15 DIAGNOSIS — F329 Major depressive disorder, single episode, unspecified: Secondary | ICD-10-CM | POA: Diagnosis not present

## 2016-09-15 DIAGNOSIS — E785 Hyperlipidemia, unspecified: Secondary | ICD-10-CM | POA: Diagnosis not present

## 2016-09-15 DIAGNOSIS — I10 Essential (primary) hypertension: Secondary | ICD-10-CM | POA: Diagnosis not present

## 2016-09-15 DIAGNOSIS — Z7901 Long term (current) use of anticoagulants: Secondary | ICD-10-CM | POA: Diagnosis not present

## 2016-09-18 DIAGNOSIS — R1312 Dysphagia, oropharyngeal phase: Secondary | ICD-10-CM | POA: Diagnosis not present

## 2016-09-18 DIAGNOSIS — I10 Essential (primary) hypertension: Secondary | ICD-10-CM | POA: Diagnosis not present

## 2016-09-18 DIAGNOSIS — F329 Major depressive disorder, single episode, unspecified: Secondary | ICD-10-CM | POA: Diagnosis not present

## 2016-09-18 DIAGNOSIS — Z5181 Encounter for therapeutic drug level monitoring: Secondary | ICD-10-CM | POA: Diagnosis not present

## 2016-09-18 DIAGNOSIS — E785 Hyperlipidemia, unspecified: Secondary | ICD-10-CM | POA: Diagnosis not present

## 2016-09-18 DIAGNOSIS — I480 Paroxysmal atrial fibrillation: Secondary | ICD-10-CM | POA: Diagnosis not present

## 2016-09-18 DIAGNOSIS — I69322 Dysarthria following cerebral infarction: Secondary | ICD-10-CM | POA: Diagnosis not present

## 2016-09-18 DIAGNOSIS — I482 Chronic atrial fibrillation: Secondary | ICD-10-CM | POA: Diagnosis not present

## 2016-09-18 DIAGNOSIS — I69391 Dysphagia following cerebral infarction: Secondary | ICD-10-CM | POA: Diagnosis not present

## 2016-09-18 DIAGNOSIS — Z7901 Long term (current) use of anticoagulants: Secondary | ICD-10-CM | POA: Diagnosis not present

## 2016-09-19 DIAGNOSIS — I10 Essential (primary) hypertension: Secondary | ICD-10-CM | POA: Diagnosis not present

## 2016-09-19 DIAGNOSIS — E785 Hyperlipidemia, unspecified: Secondary | ICD-10-CM | POA: Diagnosis not present

## 2016-09-19 DIAGNOSIS — Z5181 Encounter for therapeutic drug level monitoring: Secondary | ICD-10-CM | POA: Diagnosis not present

## 2016-09-19 DIAGNOSIS — I69322 Dysarthria following cerebral infarction: Secondary | ICD-10-CM | POA: Diagnosis not present

## 2016-09-19 DIAGNOSIS — Z7901 Long term (current) use of anticoagulants: Secondary | ICD-10-CM | POA: Diagnosis not present

## 2016-09-19 DIAGNOSIS — F329 Major depressive disorder, single episode, unspecified: Secondary | ICD-10-CM | POA: Diagnosis not present

## 2016-09-19 DIAGNOSIS — I69391 Dysphagia following cerebral infarction: Secondary | ICD-10-CM | POA: Diagnosis not present

## 2016-09-19 DIAGNOSIS — R1312 Dysphagia, oropharyngeal phase: Secondary | ICD-10-CM | POA: Diagnosis not present

## 2016-09-19 DIAGNOSIS — I482 Chronic atrial fibrillation: Secondary | ICD-10-CM | POA: Diagnosis not present

## 2016-09-20 DIAGNOSIS — I482 Chronic atrial fibrillation: Secondary | ICD-10-CM | POA: Diagnosis not present

## 2016-09-20 DIAGNOSIS — E785 Hyperlipidemia, unspecified: Secondary | ICD-10-CM | POA: Diagnosis not present

## 2016-09-20 DIAGNOSIS — R1312 Dysphagia, oropharyngeal phase: Secondary | ICD-10-CM | POA: Diagnosis not present

## 2016-09-20 DIAGNOSIS — I10 Essential (primary) hypertension: Secondary | ICD-10-CM | POA: Diagnosis not present

## 2016-09-20 DIAGNOSIS — F329 Major depressive disorder, single episode, unspecified: Secondary | ICD-10-CM | POA: Diagnosis not present

## 2016-09-20 DIAGNOSIS — Z5181 Encounter for therapeutic drug level monitoring: Secondary | ICD-10-CM | POA: Diagnosis not present

## 2016-09-20 DIAGNOSIS — I69322 Dysarthria following cerebral infarction: Secondary | ICD-10-CM | POA: Diagnosis not present

## 2016-09-20 DIAGNOSIS — Z7901 Long term (current) use of anticoagulants: Secondary | ICD-10-CM | POA: Diagnosis not present

## 2016-09-20 DIAGNOSIS — I69391 Dysphagia following cerebral infarction: Secondary | ICD-10-CM | POA: Diagnosis not present

## 2016-09-21 DIAGNOSIS — I69322 Dysarthria following cerebral infarction: Secondary | ICD-10-CM | POA: Diagnosis not present

## 2016-09-21 DIAGNOSIS — I482 Chronic atrial fibrillation: Secondary | ICD-10-CM | POA: Diagnosis not present

## 2016-09-21 DIAGNOSIS — I10 Essential (primary) hypertension: Secondary | ICD-10-CM | POA: Diagnosis not present

## 2016-09-21 DIAGNOSIS — Z5181 Encounter for therapeutic drug level monitoring: Secondary | ICD-10-CM | POA: Diagnosis not present

## 2016-09-21 DIAGNOSIS — I69391 Dysphagia following cerebral infarction: Secondary | ICD-10-CM | POA: Diagnosis not present

## 2016-09-21 DIAGNOSIS — F329 Major depressive disorder, single episode, unspecified: Secondary | ICD-10-CM | POA: Diagnosis not present

## 2016-09-21 DIAGNOSIS — R1312 Dysphagia, oropharyngeal phase: Secondary | ICD-10-CM | POA: Diagnosis not present

## 2016-09-21 DIAGNOSIS — Z7901 Long term (current) use of anticoagulants: Secondary | ICD-10-CM | POA: Diagnosis not present

## 2016-09-21 DIAGNOSIS — E785 Hyperlipidemia, unspecified: Secondary | ICD-10-CM | POA: Diagnosis not present

## 2016-09-22 DIAGNOSIS — Z5181 Encounter for therapeutic drug level monitoring: Secondary | ICD-10-CM | POA: Diagnosis not present

## 2016-09-22 DIAGNOSIS — R1312 Dysphagia, oropharyngeal phase: Secondary | ICD-10-CM | POA: Diagnosis not present

## 2016-09-22 DIAGNOSIS — I480 Paroxysmal atrial fibrillation: Secondary | ICD-10-CM | POA: Diagnosis not present

## 2016-09-22 DIAGNOSIS — Z7901 Long term (current) use of anticoagulants: Secondary | ICD-10-CM | POA: Diagnosis not present

## 2016-09-22 DIAGNOSIS — I69322 Dysarthria following cerebral infarction: Secondary | ICD-10-CM | POA: Diagnosis not present

## 2016-09-22 DIAGNOSIS — F329 Major depressive disorder, single episode, unspecified: Secondary | ICD-10-CM | POA: Diagnosis not present

## 2016-09-22 DIAGNOSIS — I69391 Dysphagia following cerebral infarction: Secondary | ICD-10-CM | POA: Diagnosis not present

## 2016-09-22 DIAGNOSIS — I482 Chronic atrial fibrillation: Secondary | ICD-10-CM | POA: Diagnosis not present

## 2016-09-22 DIAGNOSIS — I10 Essential (primary) hypertension: Secondary | ICD-10-CM | POA: Diagnosis not present

## 2016-09-22 DIAGNOSIS — E785 Hyperlipidemia, unspecified: Secondary | ICD-10-CM | POA: Diagnosis not present

## 2016-09-25 DIAGNOSIS — I725 Aneurysm of other precerebral arteries: Secondary | ICD-10-CM | POA: Diagnosis not present

## 2016-09-25 DIAGNOSIS — I773 Arterial fibromuscular dysplasia: Secondary | ICD-10-CM | POA: Diagnosis not present

## 2016-09-25 DIAGNOSIS — I63511 Cerebral infarction due to unspecified occlusion or stenosis of right middle cerebral artery: Secondary | ICD-10-CM | POA: Diagnosis not present

## 2016-09-26 DIAGNOSIS — I482 Chronic atrial fibrillation: Secondary | ICD-10-CM | POA: Diagnosis not present

## 2016-09-26 DIAGNOSIS — I10 Essential (primary) hypertension: Secondary | ICD-10-CM | POA: Diagnosis not present

## 2016-09-26 DIAGNOSIS — R1312 Dysphagia, oropharyngeal phase: Secondary | ICD-10-CM | POA: Diagnosis not present

## 2016-09-26 DIAGNOSIS — I69322 Dysarthria following cerebral infarction: Secondary | ICD-10-CM | POA: Diagnosis not present

## 2016-09-26 DIAGNOSIS — I69391 Dysphagia following cerebral infarction: Secondary | ICD-10-CM | POA: Diagnosis not present

## 2016-09-26 DIAGNOSIS — F329 Major depressive disorder, single episode, unspecified: Secondary | ICD-10-CM | POA: Diagnosis not present

## 2016-09-26 DIAGNOSIS — E785 Hyperlipidemia, unspecified: Secondary | ICD-10-CM | POA: Diagnosis not present

## 2016-09-26 DIAGNOSIS — Z5181 Encounter for therapeutic drug level monitoring: Secondary | ICD-10-CM | POA: Diagnosis not present

## 2016-09-26 DIAGNOSIS — Z7901 Long term (current) use of anticoagulants: Secondary | ICD-10-CM | POA: Diagnosis not present

## 2016-09-27 DIAGNOSIS — I480 Paroxysmal atrial fibrillation: Secondary | ICD-10-CM | POA: Diagnosis not present

## 2016-09-27 DIAGNOSIS — Z5181 Encounter for therapeutic drug level monitoring: Secondary | ICD-10-CM | POA: Diagnosis not present

## 2016-09-27 DIAGNOSIS — I10 Essential (primary) hypertension: Secondary | ICD-10-CM | POA: Diagnosis not present

## 2016-09-27 DIAGNOSIS — I482 Chronic atrial fibrillation: Secondary | ICD-10-CM | POA: Diagnosis not present

## 2016-09-27 DIAGNOSIS — I69391 Dysphagia following cerebral infarction: Secondary | ICD-10-CM | POA: Diagnosis not present

## 2016-09-27 DIAGNOSIS — F329 Major depressive disorder, single episode, unspecified: Secondary | ICD-10-CM | POA: Diagnosis not present

## 2016-09-27 DIAGNOSIS — E785 Hyperlipidemia, unspecified: Secondary | ICD-10-CM | POA: Diagnosis not present

## 2016-09-27 DIAGNOSIS — R1312 Dysphagia, oropharyngeal phase: Secondary | ICD-10-CM | POA: Diagnosis not present

## 2016-09-27 DIAGNOSIS — I69322 Dysarthria following cerebral infarction: Secondary | ICD-10-CM | POA: Diagnosis not present

## 2016-09-27 DIAGNOSIS — Z7901 Long term (current) use of anticoagulants: Secondary | ICD-10-CM | POA: Diagnosis not present

## 2016-09-28 DIAGNOSIS — F329 Major depressive disorder, single episode, unspecified: Secondary | ICD-10-CM | POA: Diagnosis not present

## 2016-09-28 DIAGNOSIS — R1312 Dysphagia, oropharyngeal phase: Secondary | ICD-10-CM | POA: Diagnosis not present

## 2016-09-28 DIAGNOSIS — I482 Chronic atrial fibrillation: Secondary | ICD-10-CM | POA: Diagnosis not present

## 2016-09-28 DIAGNOSIS — Z5181 Encounter for therapeutic drug level monitoring: Secondary | ICD-10-CM | POA: Diagnosis not present

## 2016-09-28 DIAGNOSIS — I69391 Dysphagia following cerebral infarction: Secondary | ICD-10-CM | POA: Diagnosis not present

## 2016-09-28 DIAGNOSIS — Z681 Body mass index (BMI) 19 or less, adult: Secondary | ICD-10-CM | POA: Diagnosis not present

## 2016-09-28 DIAGNOSIS — Z7901 Long term (current) use of anticoagulants: Secondary | ICD-10-CM | POA: Diagnosis not present

## 2016-09-28 DIAGNOSIS — R05 Cough: Secondary | ICD-10-CM | POA: Diagnosis not present

## 2016-09-28 DIAGNOSIS — E785 Hyperlipidemia, unspecified: Secondary | ICD-10-CM | POA: Diagnosis not present

## 2016-09-28 DIAGNOSIS — R110 Nausea: Secondary | ICD-10-CM | POA: Diagnosis not present

## 2016-09-28 DIAGNOSIS — I69322 Dysarthria following cerebral infarction: Secondary | ICD-10-CM | POA: Diagnosis not present

## 2016-09-28 DIAGNOSIS — I63511 Cerebral infarction due to unspecified occlusion or stenosis of right middle cerebral artery: Secondary | ICD-10-CM | POA: Diagnosis not present

## 2016-09-28 DIAGNOSIS — T17890A Other foreign object in other parts of respiratory tract causing asphyxiation, initial encounter: Secondary | ICD-10-CM | POA: Diagnosis not present

## 2016-09-28 DIAGNOSIS — I10 Essential (primary) hypertension: Secondary | ICD-10-CM | POA: Diagnosis not present

## 2016-09-28 DIAGNOSIS — R0602 Shortness of breath: Secondary | ICD-10-CM | POA: Diagnosis not present

## 2016-10-02 DIAGNOSIS — R1312 Dysphagia, oropharyngeal phase: Secondary | ICD-10-CM | POA: Diagnosis not present

## 2016-10-02 DIAGNOSIS — F329 Major depressive disorder, single episode, unspecified: Secondary | ICD-10-CM | POA: Diagnosis not present

## 2016-10-02 DIAGNOSIS — I482 Chronic atrial fibrillation: Secondary | ICD-10-CM | POA: Diagnosis not present

## 2016-10-02 DIAGNOSIS — E785 Hyperlipidemia, unspecified: Secondary | ICD-10-CM | POA: Diagnosis not present

## 2016-10-02 DIAGNOSIS — Z7901 Long term (current) use of anticoagulants: Secondary | ICD-10-CM | POA: Diagnosis not present

## 2016-10-02 DIAGNOSIS — I10 Essential (primary) hypertension: Secondary | ICD-10-CM | POA: Diagnosis not present

## 2016-10-02 DIAGNOSIS — I69322 Dysarthria following cerebral infarction: Secondary | ICD-10-CM | POA: Diagnosis not present

## 2016-10-02 DIAGNOSIS — I69391 Dysphagia following cerebral infarction: Secondary | ICD-10-CM | POA: Diagnosis not present

## 2016-10-02 DIAGNOSIS — Z5181 Encounter for therapeutic drug level monitoring: Secondary | ICD-10-CM | POA: Diagnosis not present

## 2016-10-04 DIAGNOSIS — I10 Essential (primary) hypertension: Secondary | ICD-10-CM | POA: Diagnosis not present

## 2016-10-04 DIAGNOSIS — Z5181 Encounter for therapeutic drug level monitoring: Secondary | ICD-10-CM | POA: Diagnosis not present

## 2016-10-04 DIAGNOSIS — I482 Chronic atrial fibrillation: Secondary | ICD-10-CM | POA: Diagnosis not present

## 2016-10-04 DIAGNOSIS — I69391 Dysphagia following cerebral infarction: Secondary | ICD-10-CM | POA: Diagnosis not present

## 2016-10-04 DIAGNOSIS — R1312 Dysphagia, oropharyngeal phase: Secondary | ICD-10-CM | POA: Diagnosis not present

## 2016-10-04 DIAGNOSIS — F329 Major depressive disorder, single episode, unspecified: Secondary | ICD-10-CM | POA: Diagnosis not present

## 2016-10-04 DIAGNOSIS — E785 Hyperlipidemia, unspecified: Secondary | ICD-10-CM | POA: Diagnosis not present

## 2016-10-04 DIAGNOSIS — I69322 Dysarthria following cerebral infarction: Secondary | ICD-10-CM | POA: Diagnosis not present

## 2016-10-04 DIAGNOSIS — Z7901 Long term (current) use of anticoagulants: Secondary | ICD-10-CM | POA: Diagnosis not present

## 2016-10-04 DIAGNOSIS — I480 Paroxysmal atrial fibrillation: Secondary | ICD-10-CM | POA: Diagnosis not present

## 2016-10-09 DIAGNOSIS — Z7901 Long term (current) use of anticoagulants: Secondary | ICD-10-CM | POA: Diagnosis not present

## 2016-10-09 DIAGNOSIS — I482 Chronic atrial fibrillation: Secondary | ICD-10-CM | POA: Diagnosis not present

## 2016-10-09 DIAGNOSIS — I10 Essential (primary) hypertension: Secondary | ICD-10-CM | POA: Diagnosis not present

## 2016-10-09 DIAGNOSIS — E785 Hyperlipidemia, unspecified: Secondary | ICD-10-CM | POA: Diagnosis not present

## 2016-10-09 DIAGNOSIS — I69391 Dysphagia following cerebral infarction: Secondary | ICD-10-CM | POA: Diagnosis not present

## 2016-10-09 DIAGNOSIS — R1312 Dysphagia, oropharyngeal phase: Secondary | ICD-10-CM | POA: Diagnosis not present

## 2016-10-09 DIAGNOSIS — F329 Major depressive disorder, single episode, unspecified: Secondary | ICD-10-CM | POA: Diagnosis not present

## 2016-10-09 DIAGNOSIS — Z5181 Encounter for therapeutic drug level monitoring: Secondary | ICD-10-CM | POA: Diagnosis not present

## 2016-10-09 DIAGNOSIS — I69322 Dysarthria following cerebral infarction: Secondary | ICD-10-CM | POA: Diagnosis not present

## 2016-10-11 DIAGNOSIS — I480 Paroxysmal atrial fibrillation: Secondary | ICD-10-CM | POA: Diagnosis not present

## 2016-10-11 DIAGNOSIS — R1312 Dysphagia, oropharyngeal phase: Secondary | ICD-10-CM | POA: Diagnosis not present

## 2016-10-11 DIAGNOSIS — E785 Hyperlipidemia, unspecified: Secondary | ICD-10-CM | POA: Diagnosis not present

## 2016-10-11 DIAGNOSIS — I10 Essential (primary) hypertension: Secondary | ICD-10-CM | POA: Diagnosis not present

## 2016-10-11 DIAGNOSIS — I482 Chronic atrial fibrillation: Secondary | ICD-10-CM | POA: Diagnosis not present

## 2016-10-11 DIAGNOSIS — I69391 Dysphagia following cerebral infarction: Secondary | ICD-10-CM | POA: Diagnosis not present

## 2016-10-11 DIAGNOSIS — I69322 Dysarthria following cerebral infarction: Secondary | ICD-10-CM | POA: Diagnosis not present

## 2016-10-11 DIAGNOSIS — Z7901 Long term (current) use of anticoagulants: Secondary | ICD-10-CM | POA: Diagnosis not present

## 2016-10-11 DIAGNOSIS — Z5181 Encounter for therapeutic drug level monitoring: Secondary | ICD-10-CM | POA: Diagnosis not present

## 2016-10-11 DIAGNOSIS — F329 Major depressive disorder, single episode, unspecified: Secondary | ICD-10-CM | POA: Diagnosis not present

## 2016-10-18 DIAGNOSIS — Z1389 Encounter for screening for other disorder: Secondary | ICD-10-CM | POA: Diagnosis not present

## 2016-10-18 DIAGNOSIS — R791 Abnormal coagulation profile: Secondary | ICD-10-CM | POA: Diagnosis not present

## 2016-10-18 DIAGNOSIS — Z681 Body mass index (BMI) 19 or less, adult: Secondary | ICD-10-CM | POA: Diagnosis not present

## 2016-10-18 DIAGNOSIS — I10 Essential (primary) hypertension: Secondary | ICD-10-CM | POA: Diagnosis not present

## 2016-10-18 DIAGNOSIS — I63511 Cerebral infarction due to unspecified occlusion or stenosis of right middle cerebral artery: Secondary | ICD-10-CM | POA: Diagnosis not present

## 2016-10-18 DIAGNOSIS — Z139 Encounter for screening, unspecified: Secondary | ICD-10-CM | POA: Diagnosis not present

## 2016-10-18 DIAGNOSIS — I4891 Unspecified atrial fibrillation: Secondary | ICD-10-CM | POA: Diagnosis not present

## 2016-10-18 DIAGNOSIS — I5032 Chronic diastolic (congestive) heart failure: Secondary | ICD-10-CM | POA: Diagnosis not present

## 2016-10-25 DIAGNOSIS — R791 Abnormal coagulation profile: Secondary | ICD-10-CM | POA: Diagnosis not present

## 2016-10-30 DIAGNOSIS — I729 Aneurysm of unspecified site: Secondary | ICD-10-CM | POA: Diagnosis not present

## 2016-10-31 DIAGNOSIS — R791 Abnormal coagulation profile: Secondary | ICD-10-CM | POA: Diagnosis not present

## 2016-11-14 DIAGNOSIS — M5412 Radiculopathy, cervical region: Secondary | ICD-10-CM | POA: Diagnosis not present

## 2016-11-23 DIAGNOSIS — F419 Anxiety disorder, unspecified: Secondary | ICD-10-CM | POA: Diagnosis not present

## 2016-11-23 DIAGNOSIS — I10 Essential (primary) hypertension: Secondary | ICD-10-CM | POA: Diagnosis not present

## 2016-11-23 DIAGNOSIS — I5032 Chronic diastolic (congestive) heart failure: Secondary | ICD-10-CM | POA: Diagnosis not present

## 2016-11-23 DIAGNOSIS — Z681 Body mass index (BMI) 19 or less, adult: Secondary | ICD-10-CM | POA: Diagnosis not present

## 2016-11-23 DIAGNOSIS — I4891 Unspecified atrial fibrillation: Secondary | ICD-10-CM | POA: Diagnosis not present

## 2016-11-23 DIAGNOSIS — I63511 Cerebral infarction due to unspecified occlusion or stenosis of right middle cerebral artery: Secondary | ICD-10-CM | POA: Diagnosis not present

## 2016-12-01 DIAGNOSIS — Z7901 Long term (current) use of anticoagulants: Secondary | ICD-10-CM | POA: Diagnosis not present

## 2016-12-04 DIAGNOSIS — Z7901 Long term (current) use of anticoagulants: Secondary | ICD-10-CM | POA: Diagnosis not present

## 2016-12-04 DIAGNOSIS — Z8673 Personal history of transient ischemic attack (TIA), and cerebral infarction without residual deficits: Secondary | ICD-10-CM | POA: Diagnosis not present

## 2016-12-04 DIAGNOSIS — F17290 Nicotine dependence, other tobacco product, uncomplicated: Secondary | ICD-10-CM | POA: Diagnosis not present

## 2016-12-04 DIAGNOSIS — I725 Aneurysm of other precerebral arteries: Secondary | ICD-10-CM | POA: Diagnosis not present

## 2016-12-04 DIAGNOSIS — R29810 Facial weakness: Secondary | ICD-10-CM | POA: Diagnosis not present

## 2016-12-04 DIAGNOSIS — I671 Cerebral aneurysm, nonruptured: Secondary | ICD-10-CM | POA: Diagnosis not present

## 2016-12-06 DIAGNOSIS — H353132 Nonexudative age-related macular degeneration, bilateral, intermediate dry stage: Secondary | ICD-10-CM | POA: Diagnosis not present

## 2016-12-06 DIAGNOSIS — H5203 Hypermetropia, bilateral: Secondary | ICD-10-CM | POA: Diagnosis not present

## 2016-12-06 DIAGNOSIS — H348312 Tributary (branch) retinal vein occlusion, right eye, stable: Secondary | ICD-10-CM | POA: Diagnosis not present

## 2016-12-27 ENCOUNTER — Ambulatory Visit (INDEPENDENT_AMBULATORY_CARE_PROVIDER_SITE_OTHER): Payer: Medicare HMO | Admitting: Ophthalmology

## 2016-12-27 DIAGNOSIS — D3132 Benign neoplasm of left choroid: Secondary | ICD-10-CM

## 2016-12-27 DIAGNOSIS — H34831 Tributary (branch) retinal vein occlusion, right eye, with macular edema: Secondary | ICD-10-CM

## 2016-12-27 DIAGNOSIS — H35033 Hypertensive retinopathy, bilateral: Secondary | ICD-10-CM | POA: Diagnosis not present

## 2016-12-27 DIAGNOSIS — H43813 Vitreous degeneration, bilateral: Secondary | ICD-10-CM | POA: Diagnosis not present

## 2016-12-27 DIAGNOSIS — H353132 Nonexudative age-related macular degeneration, bilateral, intermediate dry stage: Secondary | ICD-10-CM

## 2016-12-27 DIAGNOSIS — I10 Essential (primary) hypertension: Secondary | ICD-10-CM | POA: Diagnosis not present

## 2016-12-27 DIAGNOSIS — H2513 Age-related nuclear cataract, bilateral: Secondary | ICD-10-CM

## 2017-01-26 DIAGNOSIS — F5104 Psychophysiologic insomnia: Secondary | ICD-10-CM | POA: Diagnosis not present

## 2017-01-26 DIAGNOSIS — S20229A Contusion of unspecified back wall of thorax, initial encounter: Secondary | ICD-10-CM | POA: Diagnosis not present

## 2017-01-26 DIAGNOSIS — Z79899 Other long term (current) drug therapy: Secondary | ICD-10-CM | POA: Diagnosis not present

## 2017-01-26 DIAGNOSIS — Z681 Body mass index (BMI) 19 or less, adult: Secondary | ICD-10-CM | POA: Diagnosis not present

## 2017-01-26 DIAGNOSIS — R29898 Other symptoms and signs involving the musculoskeletal system: Secondary | ICD-10-CM | POA: Diagnosis not present

## 2017-01-30 DIAGNOSIS — I6340 Cerebral infarction due to embolism of unspecified cerebral artery: Secondary | ICD-10-CM | POA: Diagnosis not present

## 2017-01-30 DIAGNOSIS — I10 Essential (primary) hypertension: Secondary | ICD-10-CM | POA: Diagnosis not present

## 2017-01-30 DIAGNOSIS — M8589 Other specified disorders of bone density and structure, multiple sites: Secondary | ICD-10-CM | POA: Diagnosis not present

## 2017-01-30 DIAGNOSIS — I4891 Unspecified atrial fibrillation: Secondary | ICD-10-CM | POA: Diagnosis not present

## 2017-01-30 DIAGNOSIS — E785 Hyperlipidemia, unspecified: Secondary | ICD-10-CM | POA: Diagnosis not present

## 2017-01-30 DIAGNOSIS — Z139 Encounter for screening, unspecified: Secondary | ICD-10-CM | POA: Diagnosis not present

## 2017-01-30 DIAGNOSIS — Z1231 Encounter for screening mammogram for malignant neoplasm of breast: Secondary | ICD-10-CM | POA: Diagnosis not present

## 2017-01-30 DIAGNOSIS — R1084 Generalized abdominal pain: Secondary | ICD-10-CM | POA: Diagnosis not present

## 2017-01-30 DIAGNOSIS — I5032 Chronic diastolic (congestive) heart failure: Secondary | ICD-10-CM | POA: Diagnosis not present

## 2017-01-31 DIAGNOSIS — I63511 Cerebral infarction due to unspecified occlusion or stenosis of right middle cerebral artery: Secondary | ICD-10-CM | POA: Diagnosis not present

## 2017-01-31 DIAGNOSIS — M6281 Muscle weakness (generalized): Secondary | ICD-10-CM | POA: Diagnosis not present

## 2017-01-31 DIAGNOSIS — R29898 Other symptoms and signs involving the musculoskeletal system: Secondary | ICD-10-CM | POA: Diagnosis not present

## 2017-02-01 DIAGNOSIS — I63511 Cerebral infarction due to unspecified occlusion or stenosis of right middle cerebral artery: Secondary | ICD-10-CM | POA: Diagnosis not present

## 2017-02-01 DIAGNOSIS — M6281 Muscle weakness (generalized): Secondary | ICD-10-CM | POA: Diagnosis not present

## 2017-02-01 DIAGNOSIS — R29898 Other symptoms and signs involving the musculoskeletal system: Secondary | ICD-10-CM | POA: Diagnosis not present

## 2017-02-06 DIAGNOSIS — M6281 Muscle weakness (generalized): Secondary | ICD-10-CM | POA: Diagnosis not present

## 2017-02-06 DIAGNOSIS — R1084 Generalized abdominal pain: Secondary | ICD-10-CM | POA: Diagnosis not present

## 2017-02-06 DIAGNOSIS — R109 Unspecified abdominal pain: Secondary | ICD-10-CM | POA: Diagnosis not present

## 2017-02-06 DIAGNOSIS — I63511 Cerebral infarction due to unspecified occlusion or stenosis of right middle cerebral artery: Secondary | ICD-10-CM | POA: Diagnosis not present

## 2017-02-06 DIAGNOSIS — R29898 Other symptoms and signs involving the musculoskeletal system: Secondary | ICD-10-CM | POA: Diagnosis not present

## 2017-02-08 DIAGNOSIS — R29898 Other symptoms and signs involving the musculoskeletal system: Secondary | ICD-10-CM | POA: Diagnosis not present

## 2017-02-08 DIAGNOSIS — I63511 Cerebral infarction due to unspecified occlusion or stenosis of right middle cerebral artery: Secondary | ICD-10-CM | POA: Diagnosis not present

## 2017-02-08 DIAGNOSIS — M6281 Muscle weakness (generalized): Secondary | ICD-10-CM | POA: Diagnosis not present

## 2017-02-12 DIAGNOSIS — I63511 Cerebral infarction due to unspecified occlusion or stenosis of right middle cerebral artery: Secondary | ICD-10-CM | POA: Diagnosis not present

## 2017-02-12 DIAGNOSIS — R29898 Other symptoms and signs involving the musculoskeletal system: Secondary | ICD-10-CM | POA: Diagnosis not present

## 2017-02-12 DIAGNOSIS — M6281 Muscle weakness (generalized): Secondary | ICD-10-CM | POA: Diagnosis not present

## 2017-02-13 DIAGNOSIS — H02839 Dermatochalasis of unspecified eye, unspecified eyelid: Secondary | ICD-10-CM | POA: Diagnosis not present

## 2017-02-13 DIAGNOSIS — H2511 Age-related nuclear cataract, right eye: Secondary | ICD-10-CM | POA: Diagnosis not present

## 2017-02-13 DIAGNOSIS — H353130 Nonexudative age-related macular degeneration, bilateral, stage unspecified: Secondary | ICD-10-CM | POA: Diagnosis not present

## 2017-02-13 DIAGNOSIS — H18413 Arcus senilis, bilateral: Secondary | ICD-10-CM | POA: Diagnosis not present

## 2017-02-13 DIAGNOSIS — H2513 Age-related nuclear cataract, bilateral: Secondary | ICD-10-CM | POA: Diagnosis not present

## 2017-02-15 DIAGNOSIS — M6281 Muscle weakness (generalized): Secondary | ICD-10-CM | POA: Diagnosis not present

## 2017-02-15 DIAGNOSIS — R29898 Other symptoms and signs involving the musculoskeletal system: Secondary | ICD-10-CM | POA: Diagnosis not present

## 2017-02-15 DIAGNOSIS — I63511 Cerebral infarction due to unspecified occlusion or stenosis of right middle cerebral artery: Secondary | ICD-10-CM | POA: Diagnosis not present

## 2017-02-20 DIAGNOSIS — M6281 Muscle weakness (generalized): Secondary | ICD-10-CM | POA: Diagnosis not present

## 2017-02-20 DIAGNOSIS — R29898 Other symptoms and signs involving the musculoskeletal system: Secondary | ICD-10-CM | POA: Diagnosis not present

## 2017-02-20 DIAGNOSIS — I63511 Cerebral infarction due to unspecified occlusion or stenosis of right middle cerebral artery: Secondary | ICD-10-CM | POA: Diagnosis not present

## 2017-02-22 DIAGNOSIS — M6281 Muscle weakness (generalized): Secondary | ICD-10-CM | POA: Diagnosis not present

## 2017-02-22 DIAGNOSIS — R29898 Other symptoms and signs involving the musculoskeletal system: Secondary | ICD-10-CM | POA: Diagnosis not present

## 2017-02-22 DIAGNOSIS — I63511 Cerebral infarction due to unspecified occlusion or stenosis of right middle cerebral artery: Secondary | ICD-10-CM | POA: Diagnosis not present

## 2017-02-27 DIAGNOSIS — I63511 Cerebral infarction due to unspecified occlusion or stenosis of right middle cerebral artery: Secondary | ICD-10-CM | POA: Diagnosis not present

## 2017-02-27 DIAGNOSIS — R29898 Other symptoms and signs involving the musculoskeletal system: Secondary | ICD-10-CM | POA: Diagnosis not present

## 2017-02-27 DIAGNOSIS — M6281 Muscle weakness (generalized): Secondary | ICD-10-CM | POA: Diagnosis not present

## 2017-03-01 DIAGNOSIS — R29898 Other symptoms and signs involving the musculoskeletal system: Secondary | ICD-10-CM | POA: Diagnosis not present

## 2017-03-01 DIAGNOSIS — M6281 Muscle weakness (generalized): Secondary | ICD-10-CM | POA: Diagnosis not present

## 2017-03-01 DIAGNOSIS — I63511 Cerebral infarction due to unspecified occlusion or stenosis of right middle cerebral artery: Secondary | ICD-10-CM | POA: Diagnosis not present

## 2017-03-06 DIAGNOSIS — I63511 Cerebral infarction due to unspecified occlusion or stenosis of right middle cerebral artery: Secondary | ICD-10-CM | POA: Diagnosis not present

## 2017-03-06 DIAGNOSIS — R29898 Other symptoms and signs involving the musculoskeletal system: Secondary | ICD-10-CM | POA: Diagnosis not present

## 2017-03-06 DIAGNOSIS — M6281 Muscle weakness (generalized): Secondary | ICD-10-CM | POA: Diagnosis not present

## 2017-03-08 DIAGNOSIS — I63511 Cerebral infarction due to unspecified occlusion or stenosis of right middle cerebral artery: Secondary | ICD-10-CM | POA: Diagnosis not present

## 2017-03-08 DIAGNOSIS — R29898 Other symptoms and signs involving the musculoskeletal system: Secondary | ICD-10-CM | POA: Diagnosis not present

## 2017-03-08 DIAGNOSIS — M6281 Muscle weakness (generalized): Secondary | ICD-10-CM | POA: Diagnosis not present

## 2017-03-12 DIAGNOSIS — I63511 Cerebral infarction due to unspecified occlusion or stenosis of right middle cerebral artery: Secondary | ICD-10-CM | POA: Diagnosis not present

## 2017-03-12 DIAGNOSIS — M6281 Muscle weakness (generalized): Secondary | ICD-10-CM | POA: Diagnosis not present

## 2017-03-12 DIAGNOSIS — R29898 Other symptoms and signs involving the musculoskeletal system: Secondary | ICD-10-CM | POA: Diagnosis not present

## 2017-03-14 DIAGNOSIS — M6281 Muscle weakness (generalized): Secondary | ICD-10-CM | POA: Diagnosis not present

## 2017-03-14 DIAGNOSIS — R29898 Other symptoms and signs involving the musculoskeletal system: Secondary | ICD-10-CM | POA: Diagnosis not present

## 2017-03-14 DIAGNOSIS — I63511 Cerebral infarction due to unspecified occlusion or stenosis of right middle cerebral artery: Secondary | ICD-10-CM | POA: Diagnosis not present

## 2017-03-23 DIAGNOSIS — Z1231 Encounter for screening mammogram for malignant neoplasm of breast: Secondary | ICD-10-CM | POA: Diagnosis not present

## 2017-03-30 DIAGNOSIS — M8589 Other specified disorders of bone density and structure, multiple sites: Secondary | ICD-10-CM | POA: Diagnosis not present

## 2017-03-30 DIAGNOSIS — Z13820 Encounter for screening for osteoporosis: Secondary | ICD-10-CM | POA: Diagnosis not present

## 2017-04-02 DIAGNOSIS — H2513 Age-related nuclear cataract, bilateral: Secondary | ICD-10-CM | POA: Diagnosis not present

## 2017-04-02 DIAGNOSIS — H2511 Age-related nuclear cataract, right eye: Secondary | ICD-10-CM | POA: Diagnosis not present

## 2017-04-05 DIAGNOSIS — Z1389 Encounter for screening for other disorder: Secondary | ICD-10-CM | POA: Diagnosis not present

## 2017-04-05 DIAGNOSIS — Z136 Encounter for screening for cardiovascular disorders: Secondary | ICD-10-CM | POA: Diagnosis not present

## 2017-04-05 DIAGNOSIS — E785 Hyperlipidemia, unspecified: Secondary | ICD-10-CM | POA: Diagnosis not present

## 2017-04-05 DIAGNOSIS — Z9181 History of falling: Secondary | ICD-10-CM | POA: Diagnosis not present

## 2017-04-05 DIAGNOSIS — Z0000 Encounter for general adult medical examination without abnormal findings: Secondary | ICD-10-CM | POA: Diagnosis not present

## 2017-04-23 DIAGNOSIS — H2513 Age-related nuclear cataract, bilateral: Secondary | ICD-10-CM | POA: Diagnosis not present

## 2017-04-23 DIAGNOSIS — H2512 Age-related nuclear cataract, left eye: Secondary | ICD-10-CM | POA: Diagnosis not present

## 2017-05-02 DIAGNOSIS — E785 Hyperlipidemia, unspecified: Secondary | ICD-10-CM | POA: Diagnosis not present

## 2017-05-02 DIAGNOSIS — I639 Cerebral infarction, unspecified: Secondary | ICD-10-CM | POA: Diagnosis not present

## 2017-05-02 DIAGNOSIS — I10 Essential (primary) hypertension: Secondary | ICD-10-CM | POA: Diagnosis not present

## 2017-05-02 DIAGNOSIS — Z9181 History of falling: Secondary | ICD-10-CM | POA: Diagnosis not present

## 2017-05-02 DIAGNOSIS — I5032 Chronic diastolic (congestive) heart failure: Secondary | ICD-10-CM | POA: Diagnosis not present

## 2017-05-02 DIAGNOSIS — I4891 Unspecified atrial fibrillation: Secondary | ICD-10-CM | POA: Diagnosis not present

## 2017-05-02 DIAGNOSIS — I725 Aneurysm of other precerebral arteries: Secondary | ICD-10-CM | POA: Diagnosis not present

## 2017-05-02 DIAGNOSIS — Z79899 Other long term (current) drug therapy: Secondary | ICD-10-CM | POA: Diagnosis not present

## 2017-05-02 DIAGNOSIS — Z1389 Encounter for screening for other disorder: Secondary | ICD-10-CM | POA: Diagnosis not present

## 2017-05-16 DIAGNOSIS — M5412 Radiculopathy, cervical region: Secondary | ICD-10-CM | POA: Diagnosis not present

## 2017-05-23 DIAGNOSIS — Z0100 Encounter for examination of eyes and vision without abnormal findings: Secondary | ICD-10-CM | POA: Diagnosis not present

## 2017-06-01 DIAGNOSIS — R079 Chest pain, unspecified: Secondary | ICD-10-CM | POA: Diagnosis not present

## 2017-06-01 DIAGNOSIS — M50122 Cervical disc disorder at C5-C6 level with radiculopathy: Secondary | ICD-10-CM | POA: Diagnosis not present

## 2017-06-01 DIAGNOSIS — R531 Weakness: Secondary | ICD-10-CM | POA: Diagnosis not present

## 2017-06-06 DIAGNOSIS — I9589 Other hypotension: Secondary | ICD-10-CM | POA: Diagnosis not present

## 2017-06-06 DIAGNOSIS — Z79899 Other long term (current) drug therapy: Secondary | ICD-10-CM | POA: Diagnosis not present

## 2017-06-06 DIAGNOSIS — I10 Essential (primary) hypertension: Secondary | ICD-10-CM | POA: Diagnosis not present

## 2017-06-06 DIAGNOSIS — Z681 Body mass index (BMI) 19 or less, adult: Secondary | ICD-10-CM | POA: Diagnosis not present

## 2017-06-06 DIAGNOSIS — Z23 Encounter for immunization: Secondary | ICD-10-CM | POA: Diagnosis not present

## 2017-06-11 DIAGNOSIS — I773 Arterial fibromuscular dysplasia: Secondary | ICD-10-CM | POA: Diagnosis not present

## 2017-06-11 DIAGNOSIS — I671 Cerebral aneurysm, nonruptured: Secondary | ICD-10-CM | POA: Diagnosis not present

## 2017-06-22 DIAGNOSIS — Z139 Encounter for screening, unspecified: Secondary | ICD-10-CM | POA: Diagnosis not present

## 2017-06-22 DIAGNOSIS — Z681 Body mass index (BMI) 19 or less, adult: Secondary | ICD-10-CM | POA: Diagnosis not present

## 2017-06-22 DIAGNOSIS — N183 Chronic kidney disease, stage 3 (moderate): Secondary | ICD-10-CM | POA: Diagnosis not present

## 2017-06-22 DIAGNOSIS — I10 Essential (primary) hypertension: Secondary | ICD-10-CM | POA: Diagnosis not present

## 2017-06-28 DIAGNOSIS — N183 Chronic kidney disease, stage 3 (moderate): Secondary | ICD-10-CM | POA: Diagnosis not present

## 2017-07-02 DIAGNOSIS — I878 Other specified disorders of veins: Secondary | ICD-10-CM | POA: Diagnosis not present

## 2017-07-02 DIAGNOSIS — I671 Cerebral aneurysm, nonruptured: Secondary | ICD-10-CM | POA: Diagnosis not present

## 2017-07-02 DIAGNOSIS — I773 Arterial fibromuscular dysplasia: Secondary | ICD-10-CM | POA: Diagnosis not present

## 2017-07-02 DIAGNOSIS — I725 Aneurysm of other precerebral arteries: Secondary | ICD-10-CM | POA: Diagnosis not present

## 2017-07-02 DIAGNOSIS — F17290 Nicotine dependence, other tobacco product, uncomplicated: Secondary | ICD-10-CM | POA: Diagnosis not present

## 2017-07-02 DIAGNOSIS — I6529 Occlusion and stenosis of unspecified carotid artery: Secondary | ICD-10-CM | POA: Diagnosis not present

## 2017-07-10 DIAGNOSIS — N183 Chronic kidney disease, stage 3 (moderate): Secondary | ICD-10-CM | POA: Diagnosis not present

## 2017-07-10 DIAGNOSIS — N189 Chronic kidney disease, unspecified: Secondary | ICD-10-CM | POA: Diagnosis not present

## 2017-08-03 DIAGNOSIS — Z139 Encounter for screening, unspecified: Secondary | ICD-10-CM | POA: Diagnosis not present

## 2017-08-03 DIAGNOSIS — E785 Hyperlipidemia, unspecified: Secondary | ICD-10-CM | POA: Diagnosis not present

## 2017-08-03 DIAGNOSIS — I5032 Chronic diastolic (congestive) heart failure: Secondary | ICD-10-CM | POA: Diagnosis not present

## 2017-08-03 DIAGNOSIS — I63511 Cerebral infarction due to unspecified occlusion or stenosis of right middle cerebral artery: Secondary | ICD-10-CM | POA: Diagnosis not present

## 2017-08-03 DIAGNOSIS — Z681 Body mass index (BMI) 19 or less, adult: Secondary | ICD-10-CM | POA: Diagnosis not present

## 2017-08-03 DIAGNOSIS — I4891 Unspecified atrial fibrillation: Secondary | ICD-10-CM | POA: Diagnosis not present

## 2017-08-03 DIAGNOSIS — Z79899 Other long term (current) drug therapy: Secondary | ICD-10-CM | POA: Diagnosis not present

## 2017-08-03 DIAGNOSIS — I10 Essential (primary) hypertension: Secondary | ICD-10-CM | POA: Diagnosis not present

## 2017-08-21 DIAGNOSIS — M542 Cervicalgia: Secondary | ICD-10-CM | POA: Diagnosis not present

## 2017-11-02 DIAGNOSIS — I639 Cerebral infarction, unspecified: Secondary | ICD-10-CM | POA: Diagnosis not present

## 2017-11-02 DIAGNOSIS — I480 Paroxysmal atrial fibrillation: Secondary | ICD-10-CM | POA: Diagnosis not present

## 2017-11-02 DIAGNOSIS — Z681 Body mass index (BMI) 19 or less, adult: Secondary | ICD-10-CM | POA: Diagnosis not present

## 2017-11-02 DIAGNOSIS — I10 Essential (primary) hypertension: Secondary | ICD-10-CM | POA: Diagnosis not present

## 2017-11-02 DIAGNOSIS — N184 Chronic kidney disease, stage 4 (severe): Secondary | ICD-10-CM | POA: Diagnosis not present

## 2017-11-02 DIAGNOSIS — R7301 Impaired fasting glucose: Secondary | ICD-10-CM | POA: Diagnosis not present

## 2017-11-02 DIAGNOSIS — Z79899 Other long term (current) drug therapy: Secondary | ICD-10-CM | POA: Diagnosis not present

## 2017-11-02 DIAGNOSIS — E785 Hyperlipidemia, unspecified: Secondary | ICD-10-CM | POA: Diagnosis not present

## 2017-11-02 DIAGNOSIS — I5032 Chronic diastolic (congestive) heart failure: Secondary | ICD-10-CM | POA: Diagnosis not present

## 2017-11-06 DIAGNOSIS — N184 Chronic kidney disease, stage 4 (severe): Secondary | ICD-10-CM | POA: Diagnosis not present

## 2017-11-28 DIAGNOSIS — H04123 Dry eye syndrome of bilateral lacrimal glands: Secondary | ICD-10-CM | POA: Diagnosis not present

## 2017-11-28 DIAGNOSIS — H353132 Nonexudative age-related macular degeneration, bilateral, intermediate dry stage: Secondary | ICD-10-CM | POA: Diagnosis not present

## 2017-11-28 DIAGNOSIS — H26492 Other secondary cataract, left eye: Secondary | ICD-10-CM | POA: Diagnosis not present

## 2017-12-04 DIAGNOSIS — N184 Chronic kidney disease, stage 4 (severe): Secondary | ICD-10-CM | POA: Diagnosis not present

## 2017-12-21 DIAGNOSIS — Z961 Presence of intraocular lens: Secondary | ICD-10-CM | POA: Diagnosis not present

## 2017-12-21 DIAGNOSIS — H02839 Dermatochalasis of unspecified eye, unspecified eyelid: Secondary | ICD-10-CM | POA: Diagnosis not present

## 2017-12-21 DIAGNOSIS — H26492 Other secondary cataract, left eye: Secondary | ICD-10-CM | POA: Diagnosis not present

## 2017-12-31 ENCOUNTER — Ambulatory Visit (INDEPENDENT_AMBULATORY_CARE_PROVIDER_SITE_OTHER): Payer: Medicare HMO | Admitting: Ophthalmology

## 2017-12-31 DIAGNOSIS — H43813 Vitreous degeneration, bilateral: Secondary | ICD-10-CM | POA: Diagnosis not present

## 2017-12-31 DIAGNOSIS — H353112 Nonexudative age-related macular degeneration, right eye, intermediate dry stage: Secondary | ICD-10-CM | POA: Diagnosis not present

## 2017-12-31 DIAGNOSIS — I10 Essential (primary) hypertension: Secondary | ICD-10-CM

## 2017-12-31 DIAGNOSIS — H35033 Hypertensive retinopathy, bilateral: Secondary | ICD-10-CM

## 2017-12-31 DIAGNOSIS — D3132 Benign neoplasm of left choroid: Secondary | ICD-10-CM

## 2017-12-31 DIAGNOSIS — H348312 Tributary (branch) retinal vein occlusion, right eye, stable: Secondary | ICD-10-CM

## 2017-12-31 DIAGNOSIS — H353221 Exudative age-related macular degeneration, left eye, with active choroidal neovascularization: Secondary | ICD-10-CM

## 2018-01-01 DIAGNOSIS — Z681 Body mass index (BMI) 19 or less, adult: Secondary | ICD-10-CM | POA: Diagnosis not present

## 2018-01-01 DIAGNOSIS — D638 Anemia in other chronic diseases classified elsewhere: Secondary | ICD-10-CM | POA: Diagnosis not present

## 2018-01-01 DIAGNOSIS — R5383 Other fatigue: Secondary | ICD-10-CM | POA: Diagnosis not present

## 2018-01-01 DIAGNOSIS — E538 Deficiency of other specified B group vitamins: Secondary | ICD-10-CM | POA: Diagnosis not present

## 2018-01-01 DIAGNOSIS — R42 Dizziness and giddiness: Secondary | ICD-10-CM | POA: Diagnosis not present

## 2018-01-01 DIAGNOSIS — R630 Anorexia: Secondary | ICD-10-CM | POA: Diagnosis not present

## 2018-01-02 DIAGNOSIS — I252 Old myocardial infarction: Secondary | ICD-10-CM | POA: Diagnosis not present

## 2018-01-02 DIAGNOSIS — R079 Chest pain, unspecified: Secondary | ICD-10-CM | POA: Diagnosis not present

## 2018-01-02 DIAGNOSIS — E785 Hyperlipidemia, unspecified: Secondary | ICD-10-CM | POA: Diagnosis not present

## 2018-01-02 DIAGNOSIS — I482 Chronic atrial fibrillation: Secondary | ICD-10-CM | POA: Diagnosis not present

## 2018-01-02 DIAGNOSIS — I4891 Unspecified atrial fibrillation: Secondary | ICD-10-CM | POA: Diagnosis not present

## 2018-01-02 DIAGNOSIS — N390 Urinary tract infection, site not specified: Secondary | ICD-10-CM | POA: Diagnosis not present

## 2018-01-02 DIAGNOSIS — R109 Unspecified abdominal pain: Secondary | ICD-10-CM | POA: Diagnosis not present

## 2018-01-02 DIAGNOSIS — I509 Heart failure, unspecified: Secondary | ICD-10-CM | POA: Diagnosis not present

## 2018-01-02 DIAGNOSIS — Z7901 Long term (current) use of anticoagulants: Secondary | ICD-10-CM | POA: Diagnosis not present

## 2018-01-02 DIAGNOSIS — E860 Dehydration: Secondary | ICD-10-CM | POA: Diagnosis not present

## 2018-01-02 DIAGNOSIS — N179 Acute kidney failure, unspecified: Secondary | ICD-10-CM | POA: Diagnosis not present

## 2018-01-02 DIAGNOSIS — I10 Essential (primary) hypertension: Secondary | ICD-10-CM | POA: Diagnosis not present

## 2018-01-03 DIAGNOSIS — E785 Hyperlipidemia, unspecified: Secondary | ICD-10-CM | POA: Diagnosis not present

## 2018-01-03 DIAGNOSIS — N179 Acute kidney failure, unspecified: Secondary | ICD-10-CM | POA: Diagnosis not present

## 2018-01-03 DIAGNOSIS — I10 Essential (primary) hypertension: Secondary | ICD-10-CM | POA: Diagnosis not present

## 2018-01-03 DIAGNOSIS — I4891 Unspecified atrial fibrillation: Secondary | ICD-10-CM | POA: Diagnosis not present

## 2018-01-03 DIAGNOSIS — R109 Unspecified abdominal pain: Secondary | ICD-10-CM | POA: Diagnosis not present

## 2018-01-04 DIAGNOSIS — I10 Essential (primary) hypertension: Secondary | ICD-10-CM | POA: Diagnosis not present

## 2018-01-04 DIAGNOSIS — R109 Unspecified abdominal pain: Secondary | ICD-10-CM | POA: Diagnosis not present

## 2018-01-04 DIAGNOSIS — I4891 Unspecified atrial fibrillation: Secondary | ICD-10-CM | POA: Diagnosis not present

## 2018-01-04 DIAGNOSIS — E785 Hyperlipidemia, unspecified: Secondary | ICD-10-CM | POA: Diagnosis not present

## 2018-01-04 DIAGNOSIS — N179 Acute kidney failure, unspecified: Secondary | ICD-10-CM | POA: Diagnosis not present

## 2018-01-10 DIAGNOSIS — R1011 Right upper quadrant pain: Secondary | ICD-10-CM | POA: Diagnosis not present

## 2018-01-10 DIAGNOSIS — Z681 Body mass index (BMI) 19 or less, adult: Secondary | ICD-10-CM | POA: Diagnosis not present

## 2018-01-10 DIAGNOSIS — D638 Anemia in other chronic diseases classified elsewhere: Secondary | ICD-10-CM | POA: Diagnosis not present

## 2018-01-10 DIAGNOSIS — N179 Acute kidney failure, unspecified: Secondary | ICD-10-CM | POA: Diagnosis not present

## 2018-01-18 DIAGNOSIS — R109 Unspecified abdominal pain: Secondary | ICD-10-CM | POA: Diagnosis not present

## 2018-01-18 DIAGNOSIS — R1011 Right upper quadrant pain: Secondary | ICD-10-CM | POA: Diagnosis not present

## 2018-01-23 DIAGNOSIS — R634 Abnormal weight loss: Secondary | ICD-10-CM | POA: Diagnosis not present

## 2018-01-23 DIAGNOSIS — K5730 Diverticulosis of large intestine without perforation or abscess without bleeding: Secondary | ICD-10-CM | POA: Diagnosis not present

## 2018-01-23 DIAGNOSIS — J449 Chronic obstructive pulmonary disease, unspecified: Secondary | ICD-10-CM | POA: Diagnosis not present

## 2018-01-25 DIAGNOSIS — E559 Vitamin D deficiency, unspecified: Secondary | ICD-10-CM | POA: Diagnosis not present

## 2018-01-25 DIAGNOSIS — G4733 Obstructive sleep apnea (adult) (pediatric): Secondary | ICD-10-CM | POA: Diagnosis not present

## 2018-01-25 DIAGNOSIS — R0600 Dyspnea, unspecified: Secondary | ICD-10-CM | POA: Diagnosis not present

## 2018-01-25 DIAGNOSIS — R5383 Other fatigue: Secondary | ICD-10-CM | POA: Diagnosis not present

## 2018-01-25 DIAGNOSIS — J301 Allergic rhinitis due to pollen: Secondary | ICD-10-CM | POA: Diagnosis not present

## 2018-01-28 ENCOUNTER — Encounter (INDEPENDENT_AMBULATORY_CARE_PROVIDER_SITE_OTHER): Payer: Medicare HMO | Admitting: Ophthalmology

## 2018-01-28 DIAGNOSIS — H35033 Hypertensive retinopathy, bilateral: Secondary | ICD-10-CM | POA: Diagnosis not present

## 2018-01-28 DIAGNOSIS — D3132 Benign neoplasm of left choroid: Secondary | ICD-10-CM

## 2018-01-28 DIAGNOSIS — H348312 Tributary (branch) retinal vein occlusion, right eye, stable: Secondary | ICD-10-CM

## 2018-01-28 DIAGNOSIS — H43813 Vitreous degeneration, bilateral: Secondary | ICD-10-CM | POA: Diagnosis not present

## 2018-01-28 DIAGNOSIS — I10 Essential (primary) hypertension: Secondary | ICD-10-CM

## 2018-01-28 DIAGNOSIS — H353221 Exudative age-related macular degeneration, left eye, with active choroidal neovascularization: Secondary | ICD-10-CM

## 2018-01-31 DIAGNOSIS — I700 Atherosclerosis of aorta: Secondary | ICD-10-CM | POA: Diagnosis not present

## 2018-01-31 DIAGNOSIS — R918 Other nonspecific abnormal finding of lung field: Secondary | ICD-10-CM | POA: Diagnosis not present

## 2018-01-31 DIAGNOSIS — R911 Solitary pulmonary nodule: Secondary | ICD-10-CM | POA: Diagnosis not present

## 2018-02-05 DIAGNOSIS — K449 Diaphragmatic hernia without obstruction or gangrene: Secondary | ICD-10-CM | POA: Diagnosis not present

## 2018-02-05 DIAGNOSIS — J45909 Unspecified asthma, uncomplicated: Secondary | ICD-10-CM | POA: Diagnosis not present

## 2018-02-05 DIAGNOSIS — Z800 Family history of malignant neoplasm of digestive organs: Secondary | ICD-10-CM | POA: Diagnosis not present

## 2018-02-05 DIAGNOSIS — Z7901 Long term (current) use of anticoagulants: Secondary | ICD-10-CM | POA: Diagnosis not present

## 2018-02-05 DIAGNOSIS — R1310 Dysphagia, unspecified: Secondary | ICD-10-CM | POA: Diagnosis not present

## 2018-02-05 DIAGNOSIS — K222 Esophageal obstruction: Secondary | ICD-10-CM | POA: Diagnosis not present

## 2018-02-05 DIAGNOSIS — I10 Essential (primary) hypertension: Secondary | ICD-10-CM | POA: Diagnosis not present

## 2018-02-05 DIAGNOSIS — R634 Abnormal weight loss: Secondary | ICD-10-CM | POA: Diagnosis not present

## 2018-02-05 DIAGNOSIS — Z85828 Personal history of other malignant neoplasm of skin: Secondary | ICD-10-CM | POA: Diagnosis not present

## 2018-02-05 DIAGNOSIS — F17210 Nicotine dependence, cigarettes, uncomplicated: Secondary | ICD-10-CM | POA: Diagnosis not present

## 2018-02-05 DIAGNOSIS — F17290 Nicotine dependence, other tobacco product, uncomplicated: Secondary | ICD-10-CM | POA: Diagnosis not present

## 2018-02-05 DIAGNOSIS — R630 Anorexia: Secondary | ICD-10-CM | POA: Diagnosis not present

## 2018-02-06 DIAGNOSIS — E785 Hyperlipidemia, unspecified: Secondary | ICD-10-CM | POA: Diagnosis not present

## 2018-02-06 DIAGNOSIS — R7301 Impaired fasting glucose: Secondary | ICD-10-CM | POA: Diagnosis not present

## 2018-02-06 DIAGNOSIS — I10 Essential (primary) hypertension: Secondary | ICD-10-CM | POA: Diagnosis not present

## 2018-02-06 DIAGNOSIS — N184 Chronic kidney disease, stage 4 (severe): Secondary | ICD-10-CM | POA: Diagnosis not present

## 2018-02-06 DIAGNOSIS — I480 Paroxysmal atrial fibrillation: Secondary | ICD-10-CM | POA: Diagnosis not present

## 2018-02-06 DIAGNOSIS — I5032 Chronic diastolic (congestive) heart failure: Secondary | ICD-10-CM | POA: Diagnosis not present

## 2018-02-06 DIAGNOSIS — I639 Cerebral infarction, unspecified: Secondary | ICD-10-CM | POA: Diagnosis not present

## 2018-02-06 DIAGNOSIS — Z79899 Other long term (current) drug therapy: Secondary | ICD-10-CM | POA: Diagnosis not present

## 2018-02-20 DIAGNOSIS — I129 Hypertensive chronic kidney disease with stage 1 through stage 4 chronic kidney disease, or unspecified chronic kidney disease: Secondary | ICD-10-CM | POA: Diagnosis not present

## 2018-02-20 DIAGNOSIS — N184 Chronic kidney disease, stage 4 (severe): Secondary | ICD-10-CM | POA: Diagnosis not present

## 2018-02-23 DIAGNOSIS — G4733 Obstructive sleep apnea (adult) (pediatric): Secondary | ICD-10-CM | POA: Diagnosis not present

## 2018-02-25 ENCOUNTER — Encounter (INDEPENDENT_AMBULATORY_CARE_PROVIDER_SITE_OTHER): Payer: Medicare HMO | Admitting: Ophthalmology

## 2018-02-25 DIAGNOSIS — D3132 Benign neoplasm of left choroid: Secondary | ICD-10-CM

## 2018-02-25 DIAGNOSIS — H353112 Nonexudative age-related macular degeneration, right eye, intermediate dry stage: Secondary | ICD-10-CM

## 2018-02-25 DIAGNOSIS — I10 Essential (primary) hypertension: Secondary | ICD-10-CM

## 2018-02-25 DIAGNOSIS — H348312 Tributary (branch) retinal vein occlusion, right eye, stable: Secondary | ICD-10-CM

## 2018-02-25 DIAGNOSIS — H353221 Exudative age-related macular degeneration, left eye, with active choroidal neovascularization: Secondary | ICD-10-CM | POA: Diagnosis not present

## 2018-02-25 DIAGNOSIS — H35033 Hypertensive retinopathy, bilateral: Secondary | ICD-10-CM

## 2018-02-25 DIAGNOSIS — H43813 Vitreous degeneration, bilateral: Secondary | ICD-10-CM

## 2018-02-26 DIAGNOSIS — R911 Solitary pulmonary nodule: Secondary | ICD-10-CM | POA: Diagnosis not present

## 2018-02-26 DIAGNOSIS — J301 Allergic rhinitis due to pollen: Secondary | ICD-10-CM | POA: Diagnosis not present

## 2018-02-26 DIAGNOSIS — G4733 Obstructive sleep apnea (adult) (pediatric): Secondary | ICD-10-CM | POA: Diagnosis not present

## 2018-02-26 DIAGNOSIS — R5383 Other fatigue: Secondary | ICD-10-CM | POA: Diagnosis not present

## 2018-03-11 DIAGNOSIS — J449 Chronic obstructive pulmonary disease, unspecified: Secondary | ICD-10-CM | POA: Diagnosis not present

## 2018-03-11 DIAGNOSIS — K222 Esophageal obstruction: Secondary | ICD-10-CM | POA: Diagnosis not present

## 2018-03-11 DIAGNOSIS — R630 Anorexia: Secondary | ICD-10-CM | POA: Diagnosis not present

## 2018-03-11 DIAGNOSIS — K219 Gastro-esophageal reflux disease without esophagitis: Secondary | ICD-10-CM | POA: Diagnosis not present

## 2018-03-13 DIAGNOSIS — G4733 Obstructive sleep apnea (adult) (pediatric): Secondary | ICD-10-CM | POA: Diagnosis not present

## 2018-03-20 DIAGNOSIS — G4733 Obstructive sleep apnea (adult) (pediatric): Secondary | ICD-10-CM | POA: Diagnosis not present

## 2018-03-20 DIAGNOSIS — R5383 Other fatigue: Secondary | ICD-10-CM | POA: Diagnosis not present

## 2018-03-20 DIAGNOSIS — J301 Allergic rhinitis due to pollen: Secondary | ICD-10-CM | POA: Diagnosis not present

## 2018-03-20 DIAGNOSIS — R911 Solitary pulmonary nodule: Secondary | ICD-10-CM | POA: Diagnosis not present

## 2018-03-25 ENCOUNTER — Encounter (INDEPENDENT_AMBULATORY_CARE_PROVIDER_SITE_OTHER): Payer: Medicare HMO | Admitting: Ophthalmology

## 2018-03-25 DIAGNOSIS — I10 Essential (primary) hypertension: Secondary | ICD-10-CM

## 2018-03-25 DIAGNOSIS — H353112 Nonexudative age-related macular degeneration, right eye, intermediate dry stage: Secondary | ICD-10-CM | POA: Diagnosis not present

## 2018-03-25 DIAGNOSIS — H348312 Tributary (branch) retinal vein occlusion, right eye, stable: Secondary | ICD-10-CM

## 2018-03-25 DIAGNOSIS — H353221 Exudative age-related macular degeneration, left eye, with active choroidal neovascularization: Secondary | ICD-10-CM | POA: Diagnosis not present

## 2018-03-25 DIAGNOSIS — H35033 Hypertensive retinopathy, bilateral: Secondary | ICD-10-CM

## 2018-03-25 DIAGNOSIS — H43813 Vitreous degeneration, bilateral: Secondary | ICD-10-CM

## 2018-04-09 DIAGNOSIS — Z0000 Encounter for general adult medical examination without abnormal findings: Secondary | ICD-10-CM | POA: Diagnosis not present

## 2018-04-09 DIAGNOSIS — Z1339 Encounter for screening examination for other mental health and behavioral disorders: Secondary | ICD-10-CM | POA: Diagnosis not present

## 2018-04-09 DIAGNOSIS — E785 Hyperlipidemia, unspecified: Secondary | ICD-10-CM | POA: Diagnosis not present

## 2018-04-09 DIAGNOSIS — Z9181 History of falling: Secondary | ICD-10-CM | POA: Diagnosis not present

## 2018-04-09 DIAGNOSIS — Z1231 Encounter for screening mammogram for malignant neoplasm of breast: Secondary | ICD-10-CM | POA: Diagnosis not present

## 2018-04-09 DIAGNOSIS — G4733 Obstructive sleep apnea (adult) (pediatric): Secondary | ICD-10-CM | POA: Diagnosis not present

## 2018-04-09 DIAGNOSIS — Z1331 Encounter for screening for depression: Secondary | ICD-10-CM | POA: Diagnosis not present

## 2018-04-23 DIAGNOSIS — Z1231 Encounter for screening mammogram for malignant neoplasm of breast: Secondary | ICD-10-CM | POA: Diagnosis not present

## 2018-04-29 ENCOUNTER — Encounter (INDEPENDENT_AMBULATORY_CARE_PROVIDER_SITE_OTHER): Payer: Medicare HMO | Admitting: Ophthalmology

## 2018-04-29 DIAGNOSIS — H353112 Nonexudative age-related macular degeneration, right eye, intermediate dry stage: Secondary | ICD-10-CM

## 2018-04-29 DIAGNOSIS — I10 Essential (primary) hypertension: Secondary | ICD-10-CM | POA: Diagnosis not present

## 2018-04-29 DIAGNOSIS — H353221 Exudative age-related macular degeneration, left eye, with active choroidal neovascularization: Secondary | ICD-10-CM

## 2018-04-29 DIAGNOSIS — H35033 Hypertensive retinopathy, bilateral: Secondary | ICD-10-CM

## 2018-04-29 DIAGNOSIS — H43813 Vitreous degeneration, bilateral: Secondary | ICD-10-CM | POA: Diagnosis not present

## 2018-05-15 DIAGNOSIS — N184 Chronic kidney disease, stage 4 (severe): Secondary | ICD-10-CM | POA: Diagnosis not present

## 2018-05-15 DIAGNOSIS — I5032 Chronic diastolic (congestive) heart failure: Secondary | ICD-10-CM | POA: Diagnosis not present

## 2018-05-15 DIAGNOSIS — Z23 Encounter for immunization: Secondary | ICD-10-CM | POA: Diagnosis not present

## 2018-05-15 DIAGNOSIS — I10 Essential (primary) hypertension: Secondary | ICD-10-CM | POA: Diagnosis not present

## 2018-05-15 DIAGNOSIS — I480 Paroxysmal atrial fibrillation: Secondary | ICD-10-CM | POA: Diagnosis not present

## 2018-05-15 DIAGNOSIS — Z79899 Other long term (current) drug therapy: Secondary | ICD-10-CM | POA: Diagnosis not present

## 2018-05-15 DIAGNOSIS — R7301 Impaired fasting glucose: Secondary | ICD-10-CM | POA: Diagnosis not present

## 2018-05-15 DIAGNOSIS — I639 Cerebral infarction, unspecified: Secondary | ICD-10-CM | POA: Diagnosis not present

## 2018-05-15 DIAGNOSIS — E785 Hyperlipidemia, unspecified: Secondary | ICD-10-CM | POA: Diagnosis not present

## 2018-05-29 DIAGNOSIS — G4733 Obstructive sleep apnea (adult) (pediatric): Secondary | ICD-10-CM | POA: Diagnosis not present

## 2018-05-29 DIAGNOSIS — R5383 Other fatigue: Secondary | ICD-10-CM | POA: Diagnosis not present

## 2018-05-29 DIAGNOSIS — R911 Solitary pulmonary nodule: Secondary | ICD-10-CM | POA: Diagnosis not present

## 2018-05-29 DIAGNOSIS — J301 Allergic rhinitis due to pollen: Secondary | ICD-10-CM | POA: Diagnosis not present

## 2018-05-31 DIAGNOSIS — G4733 Obstructive sleep apnea (adult) (pediatric): Secondary | ICD-10-CM | POA: Diagnosis not present

## 2018-06-03 ENCOUNTER — Encounter (INDEPENDENT_AMBULATORY_CARE_PROVIDER_SITE_OTHER): Payer: Medicare HMO | Admitting: Ophthalmology

## 2018-06-05 DIAGNOSIS — G4733 Obstructive sleep apnea (adult) (pediatric): Secondary | ICD-10-CM | POA: Diagnosis not present

## 2018-06-14 ENCOUNTER — Encounter (INDEPENDENT_AMBULATORY_CARE_PROVIDER_SITE_OTHER): Payer: Medicare HMO | Admitting: Ophthalmology

## 2018-06-14 DIAGNOSIS — H353112 Nonexudative age-related macular degeneration, right eye, intermediate dry stage: Secondary | ICD-10-CM

## 2018-06-14 DIAGNOSIS — H35033 Hypertensive retinopathy, bilateral: Secondary | ICD-10-CM | POA: Diagnosis not present

## 2018-06-14 DIAGNOSIS — H43813 Vitreous degeneration, bilateral: Secondary | ICD-10-CM | POA: Diagnosis not present

## 2018-06-14 DIAGNOSIS — I10 Essential (primary) hypertension: Secondary | ICD-10-CM

## 2018-06-14 DIAGNOSIS — H348312 Tributary (branch) retinal vein occlusion, right eye, stable: Secondary | ICD-10-CM

## 2018-06-14 DIAGNOSIS — H353221 Exudative age-related macular degeneration, left eye, with active choroidal neovascularization: Secondary | ICD-10-CM | POA: Diagnosis not present

## 2018-07-01 DIAGNOSIS — I671 Cerebral aneurysm, nonruptured: Secondary | ICD-10-CM | POA: Diagnosis not present

## 2018-07-01 DIAGNOSIS — G4733 Obstructive sleep apnea (adult) (pediatric): Secondary | ICD-10-CM | POA: Diagnosis not present

## 2018-07-01 DIAGNOSIS — J45909 Unspecified asthma, uncomplicated: Secondary | ICD-10-CM | POA: Insufficient documentation

## 2018-07-01 DIAGNOSIS — I10 Essential (primary) hypertension: Secondary | ICD-10-CM | POA: Diagnosis not present

## 2018-07-01 DIAGNOSIS — I729 Aneurysm of unspecified site: Secondary | ICD-10-CM

## 2018-07-01 DIAGNOSIS — I725 Aneurysm of other precerebral arteries: Secondary | ICD-10-CM | POA: Diagnosis not present

## 2018-07-01 HISTORY — DX: Aneurysm of unspecified site: I72.9

## 2018-07-09 DIAGNOSIS — G4733 Obstructive sleep apnea (adult) (pediatric): Secondary | ICD-10-CM | POA: Diagnosis not present

## 2018-07-09 DIAGNOSIS — N184 Chronic kidney disease, stage 4 (severe): Secondary | ICD-10-CM | POA: Diagnosis not present

## 2018-07-09 DIAGNOSIS — R5383 Other fatigue: Secondary | ICD-10-CM | POA: Diagnosis not present

## 2018-07-09 DIAGNOSIS — R911 Solitary pulmonary nodule: Secondary | ICD-10-CM | POA: Diagnosis not present

## 2018-07-09 DIAGNOSIS — J301 Allergic rhinitis due to pollen: Secondary | ICD-10-CM | POA: Diagnosis not present

## 2018-07-17 DIAGNOSIS — N183 Chronic kidney disease, stage 3 (moderate): Secondary | ICD-10-CM | POA: Diagnosis not present

## 2018-07-17 DIAGNOSIS — I129 Hypertensive chronic kidney disease with stage 1 through stage 4 chronic kidney disease, or unspecified chronic kidney disease: Secondary | ICD-10-CM | POA: Diagnosis not present

## 2018-07-17 DIAGNOSIS — E559 Vitamin D deficiency, unspecified: Secondary | ICD-10-CM | POA: Diagnosis not present

## 2018-07-17 DIAGNOSIS — D631 Anemia in chronic kidney disease: Secondary | ICD-10-CM | POA: Diagnosis not present

## 2018-07-19 ENCOUNTER — Encounter (INDEPENDENT_AMBULATORY_CARE_PROVIDER_SITE_OTHER): Payer: Medicare HMO | Admitting: Ophthalmology

## 2018-07-19 DIAGNOSIS — H348312 Tributary (branch) retinal vein occlusion, right eye, stable: Secondary | ICD-10-CM

## 2018-07-19 DIAGNOSIS — H43813 Vitreous degeneration, bilateral: Secondary | ICD-10-CM

## 2018-07-19 DIAGNOSIS — I10 Essential (primary) hypertension: Secondary | ICD-10-CM

## 2018-07-19 DIAGNOSIS — H35033 Hypertensive retinopathy, bilateral: Secondary | ICD-10-CM | POA: Diagnosis not present

## 2018-07-19 DIAGNOSIS — H353221 Exudative age-related macular degeneration, left eye, with active choroidal neovascularization: Secondary | ICD-10-CM

## 2018-07-19 DIAGNOSIS — H353112 Nonexudative age-related macular degeneration, right eye, intermediate dry stage: Secondary | ICD-10-CM

## 2018-07-31 DIAGNOSIS — G4733 Obstructive sleep apnea (adult) (pediatric): Secondary | ICD-10-CM | POA: Diagnosis not present

## 2018-08-21 DIAGNOSIS — N184 Chronic kidney disease, stage 4 (severe): Secondary | ICD-10-CM | POA: Diagnosis not present

## 2018-08-21 DIAGNOSIS — Z79899 Other long term (current) drug therapy: Secondary | ICD-10-CM | POA: Diagnosis not present

## 2018-08-21 DIAGNOSIS — I5032 Chronic diastolic (congestive) heart failure: Secondary | ICD-10-CM | POA: Diagnosis not present

## 2018-08-21 DIAGNOSIS — I10 Essential (primary) hypertension: Secondary | ICD-10-CM | POA: Diagnosis not present

## 2018-08-21 DIAGNOSIS — Z139 Encounter for screening, unspecified: Secondary | ICD-10-CM | POA: Diagnosis not present

## 2018-08-21 DIAGNOSIS — I480 Paroxysmal atrial fibrillation: Secondary | ICD-10-CM | POA: Diagnosis not present

## 2018-08-21 DIAGNOSIS — I639 Cerebral infarction, unspecified: Secondary | ICD-10-CM | POA: Diagnosis not present

## 2018-08-21 DIAGNOSIS — R7301 Impaired fasting glucose: Secondary | ICD-10-CM | POA: Diagnosis not present

## 2018-08-21 DIAGNOSIS — E785 Hyperlipidemia, unspecified: Secondary | ICD-10-CM | POA: Diagnosis not present

## 2018-08-29 ENCOUNTER — Encounter (INDEPENDENT_AMBULATORY_CARE_PROVIDER_SITE_OTHER): Payer: Medicare HMO | Admitting: Ophthalmology

## 2018-08-29 DIAGNOSIS — H43813 Vitreous degeneration, bilateral: Secondary | ICD-10-CM | POA: Diagnosis not present

## 2018-08-29 DIAGNOSIS — H353112 Nonexudative age-related macular degeneration, right eye, intermediate dry stage: Secondary | ICD-10-CM | POA: Diagnosis not present

## 2018-08-29 DIAGNOSIS — H348312 Tributary (branch) retinal vein occlusion, right eye, stable: Secondary | ICD-10-CM

## 2018-08-29 DIAGNOSIS — I10 Essential (primary) hypertension: Secondary | ICD-10-CM | POA: Diagnosis not present

## 2018-08-29 DIAGNOSIS — H35033 Hypertensive retinopathy, bilateral: Secondary | ICD-10-CM

## 2018-08-29 DIAGNOSIS — H353221 Exudative age-related macular degeneration, left eye, with active choroidal neovascularization: Secondary | ICD-10-CM | POA: Diagnosis not present

## 2018-08-31 DIAGNOSIS — G4733 Obstructive sleep apnea (adult) (pediatric): Secondary | ICD-10-CM | POA: Diagnosis not present

## 2018-10-01 DIAGNOSIS — G4733 Obstructive sleep apnea (adult) (pediatric): Secondary | ICD-10-CM | POA: Diagnosis not present

## 2018-10-08 DIAGNOSIS — G4733 Obstructive sleep apnea (adult) (pediatric): Secondary | ICD-10-CM | POA: Diagnosis not present

## 2018-10-10 ENCOUNTER — Encounter (INDEPENDENT_AMBULATORY_CARE_PROVIDER_SITE_OTHER): Payer: Medicare HMO | Admitting: Ophthalmology

## 2018-10-10 DIAGNOSIS — H43813 Vitreous degeneration, bilateral: Secondary | ICD-10-CM | POA: Diagnosis not present

## 2018-10-10 DIAGNOSIS — H353112 Nonexudative age-related macular degeneration, right eye, intermediate dry stage: Secondary | ICD-10-CM

## 2018-10-10 DIAGNOSIS — I10 Essential (primary) hypertension: Secondary | ICD-10-CM | POA: Diagnosis not present

## 2018-10-10 DIAGNOSIS — H348312 Tributary (branch) retinal vein occlusion, right eye, stable: Secondary | ICD-10-CM

## 2018-10-10 DIAGNOSIS — H35033 Hypertensive retinopathy, bilateral: Secondary | ICD-10-CM

## 2018-10-10 DIAGNOSIS — H353221 Exudative age-related macular degeneration, left eye, with active choroidal neovascularization: Secondary | ICD-10-CM

## 2018-10-15 DIAGNOSIS — R5383 Other fatigue: Secondary | ICD-10-CM | POA: Diagnosis not present

## 2018-10-15 DIAGNOSIS — R911 Solitary pulmonary nodule: Secondary | ICD-10-CM | POA: Diagnosis not present

## 2018-10-15 DIAGNOSIS — J301 Allergic rhinitis due to pollen: Secondary | ICD-10-CM | POA: Diagnosis not present

## 2018-10-15 DIAGNOSIS — G4733 Obstructive sleep apnea (adult) (pediatric): Secondary | ICD-10-CM | POA: Diagnosis not present

## 2018-10-28 DIAGNOSIS — G4733 Obstructive sleep apnea (adult) (pediatric): Secondary | ICD-10-CM | POA: Diagnosis not present

## 2018-10-30 DIAGNOSIS — G4733 Obstructive sleep apnea (adult) (pediatric): Secondary | ICD-10-CM | POA: Diagnosis not present

## 2018-11-06 DIAGNOSIS — G4733 Obstructive sleep apnea (adult) (pediatric): Secondary | ICD-10-CM | POA: Diagnosis not present

## 2018-11-06 DIAGNOSIS — R5383 Other fatigue: Secondary | ICD-10-CM | POA: Diagnosis not present

## 2018-11-06 DIAGNOSIS — J301 Allergic rhinitis due to pollen: Secondary | ICD-10-CM | POA: Diagnosis not present

## 2018-11-14 ENCOUNTER — Other Ambulatory Visit: Payer: Self-pay

## 2018-11-14 ENCOUNTER — Encounter (INDEPENDENT_AMBULATORY_CARE_PROVIDER_SITE_OTHER): Payer: Medicare HMO | Admitting: Ophthalmology

## 2018-11-14 DIAGNOSIS — H353112 Nonexudative age-related macular degeneration, right eye, intermediate dry stage: Secondary | ICD-10-CM | POA: Diagnosis not present

## 2018-11-14 DIAGNOSIS — H35033 Hypertensive retinopathy, bilateral: Secondary | ICD-10-CM | POA: Diagnosis not present

## 2018-11-14 DIAGNOSIS — I10 Essential (primary) hypertension: Secondary | ICD-10-CM

## 2018-11-14 DIAGNOSIS — H43813 Vitreous degeneration, bilateral: Secondary | ICD-10-CM

## 2018-11-14 DIAGNOSIS — H348312 Tributary (branch) retinal vein occlusion, right eye, stable: Secondary | ICD-10-CM | POA: Diagnosis not present

## 2018-11-14 DIAGNOSIS — H353221 Exudative age-related macular degeneration, left eye, with active choroidal neovascularization: Secondary | ICD-10-CM | POA: Diagnosis not present

## 2018-11-25 DIAGNOSIS — R7301 Impaired fasting glucose: Secondary | ICD-10-CM | POA: Diagnosis not present

## 2018-11-25 DIAGNOSIS — I480 Paroxysmal atrial fibrillation: Secondary | ICD-10-CM | POA: Diagnosis not present

## 2018-11-25 DIAGNOSIS — I10 Essential (primary) hypertension: Secondary | ICD-10-CM | POA: Diagnosis not present

## 2018-11-25 DIAGNOSIS — E785 Hyperlipidemia, unspecified: Secondary | ICD-10-CM | POA: Diagnosis not present

## 2018-11-25 DIAGNOSIS — I6990 Unspecified sequelae of unspecified cerebrovascular disease: Secondary | ICD-10-CM | POA: Diagnosis not present

## 2018-11-25 DIAGNOSIS — Z79899 Other long term (current) drug therapy: Secondary | ICD-10-CM | POA: Diagnosis not present

## 2018-11-25 DIAGNOSIS — N183 Chronic kidney disease, stage 3 (moderate): Secondary | ICD-10-CM | POA: Diagnosis not present

## 2018-11-25 DIAGNOSIS — I5032 Chronic diastolic (congestive) heart failure: Secondary | ICD-10-CM | POA: Diagnosis not present

## 2018-11-28 DIAGNOSIS — G4733 Obstructive sleep apnea (adult) (pediatric): Secondary | ICD-10-CM | POA: Diagnosis not present

## 2018-11-30 DIAGNOSIS — G4733 Obstructive sleep apnea (adult) (pediatric): Secondary | ICD-10-CM | POA: Diagnosis not present

## 2018-12-26 ENCOUNTER — Encounter (INDEPENDENT_AMBULATORY_CARE_PROVIDER_SITE_OTHER): Payer: Medicare HMO | Admitting: Ophthalmology

## 2018-12-26 ENCOUNTER — Other Ambulatory Visit: Payer: Self-pay

## 2018-12-26 DIAGNOSIS — I10 Essential (primary) hypertension: Secondary | ICD-10-CM | POA: Diagnosis not present

## 2018-12-26 DIAGNOSIS — H43813 Vitreous degeneration, bilateral: Secondary | ICD-10-CM | POA: Diagnosis not present

## 2018-12-26 DIAGNOSIS — H353221 Exudative age-related macular degeneration, left eye, with active choroidal neovascularization: Secondary | ICD-10-CM | POA: Diagnosis not present

## 2018-12-26 DIAGNOSIS — H35033 Hypertensive retinopathy, bilateral: Secondary | ICD-10-CM

## 2018-12-26 DIAGNOSIS — H353112 Nonexudative age-related macular degeneration, right eye, intermediate dry stage: Secondary | ICD-10-CM

## 2018-12-26 DIAGNOSIS — H348322 Tributary (branch) retinal vein occlusion, left eye, stable: Secondary | ICD-10-CM | POA: Diagnosis not present

## 2018-12-30 DIAGNOSIS — G4733 Obstructive sleep apnea (adult) (pediatric): Secondary | ICD-10-CM | POA: Diagnosis not present

## 2019-01-30 DIAGNOSIS — G4733 Obstructive sleep apnea (adult) (pediatric): Secondary | ICD-10-CM | POA: Diagnosis not present

## 2019-02-10 DIAGNOSIS — G4733 Obstructive sleep apnea (adult) (pediatric): Secondary | ICD-10-CM | POA: Diagnosis not present

## 2019-02-10 DIAGNOSIS — J301 Allergic rhinitis due to pollen: Secondary | ICD-10-CM | POA: Diagnosis not present

## 2019-02-10 DIAGNOSIS — F411 Generalized anxiety disorder: Secondary | ICD-10-CM | POA: Diagnosis not present

## 2019-02-10 DIAGNOSIS — R5383 Other fatigue: Secondary | ICD-10-CM | POA: Diagnosis not present

## 2019-02-13 ENCOUNTER — Encounter (INDEPENDENT_AMBULATORY_CARE_PROVIDER_SITE_OTHER): Payer: Medicare HMO | Admitting: Ophthalmology

## 2019-02-13 ENCOUNTER — Other Ambulatory Visit: Payer: Self-pay

## 2019-02-13 DIAGNOSIS — H348312 Tributary (branch) retinal vein occlusion, right eye, stable: Secondary | ICD-10-CM | POA: Diagnosis not present

## 2019-02-13 DIAGNOSIS — I10 Essential (primary) hypertension: Secondary | ICD-10-CM | POA: Diagnosis not present

## 2019-02-13 DIAGNOSIS — H43813 Vitreous degeneration, bilateral: Secondary | ICD-10-CM

## 2019-02-13 DIAGNOSIS — H353221 Exudative age-related macular degeneration, left eye, with active choroidal neovascularization: Secondary | ICD-10-CM

## 2019-02-13 DIAGNOSIS — H35033 Hypertensive retinopathy, bilateral: Secondary | ICD-10-CM

## 2019-02-13 DIAGNOSIS — H353112 Nonexudative age-related macular degeneration, right eye, intermediate dry stage: Secondary | ICD-10-CM | POA: Diagnosis not present

## 2019-02-24 DIAGNOSIS — I5032 Chronic diastolic (congestive) heart failure: Secondary | ICD-10-CM | POA: Diagnosis not present

## 2019-02-24 DIAGNOSIS — E785 Hyperlipidemia, unspecified: Secondary | ICD-10-CM | POA: Diagnosis not present

## 2019-02-24 DIAGNOSIS — Z79899 Other long term (current) drug therapy: Secondary | ICD-10-CM | POA: Diagnosis not present

## 2019-02-24 DIAGNOSIS — I10 Essential (primary) hypertension: Secondary | ICD-10-CM | POA: Diagnosis not present

## 2019-02-24 DIAGNOSIS — I6990 Unspecified sequelae of unspecified cerebrovascular disease: Secondary | ICD-10-CM | POA: Diagnosis not present

## 2019-02-24 DIAGNOSIS — R7301 Impaired fasting glucose: Secondary | ICD-10-CM | POA: Diagnosis not present

## 2019-02-24 DIAGNOSIS — I480 Paroxysmal atrial fibrillation: Secondary | ICD-10-CM | POA: Diagnosis not present

## 2019-02-24 DIAGNOSIS — N183 Chronic kidney disease, stage 3 (moderate): Secondary | ICD-10-CM | POA: Diagnosis not present

## 2019-03-01 DIAGNOSIS — G4733 Obstructive sleep apnea (adult) (pediatric): Secondary | ICD-10-CM | POA: Diagnosis not present

## 2019-04-01 DIAGNOSIS — G4733 Obstructive sleep apnea (adult) (pediatric): Secondary | ICD-10-CM | POA: Diagnosis not present

## 2019-04-08 DIAGNOSIS — G4733 Obstructive sleep apnea (adult) (pediatric): Secondary | ICD-10-CM | POA: Diagnosis not present

## 2019-04-10 ENCOUNTER — Encounter (INDEPENDENT_AMBULATORY_CARE_PROVIDER_SITE_OTHER): Payer: Medicare HMO | Admitting: Ophthalmology

## 2019-04-10 ENCOUNTER — Other Ambulatory Visit: Payer: Self-pay

## 2019-04-10 DIAGNOSIS — H35033 Hypertensive retinopathy, bilateral: Secondary | ICD-10-CM

## 2019-04-10 DIAGNOSIS — I10 Essential (primary) hypertension: Secondary | ICD-10-CM | POA: Diagnosis not present

## 2019-04-10 DIAGNOSIS — H353221 Exudative age-related macular degeneration, left eye, with active choroidal neovascularization: Secondary | ICD-10-CM | POA: Diagnosis not present

## 2019-04-10 DIAGNOSIS — H353112 Nonexudative age-related macular degeneration, right eye, intermediate dry stage: Secondary | ICD-10-CM | POA: Diagnosis not present

## 2019-04-10 DIAGNOSIS — H43813 Vitreous degeneration, bilateral: Secondary | ICD-10-CM

## 2019-04-14 DIAGNOSIS — E785 Hyperlipidemia, unspecified: Secondary | ICD-10-CM | POA: Diagnosis not present

## 2019-04-14 DIAGNOSIS — N959 Unspecified menopausal and perimenopausal disorder: Secondary | ICD-10-CM | POA: Diagnosis not present

## 2019-04-14 DIAGNOSIS — Z9181 History of falling: Secondary | ICD-10-CM | POA: Diagnosis not present

## 2019-04-14 DIAGNOSIS — Z1331 Encounter for screening for depression: Secondary | ICD-10-CM | POA: Diagnosis not present

## 2019-04-14 DIAGNOSIS — Z0000 Encounter for general adult medical examination without abnormal findings: Secondary | ICD-10-CM | POA: Diagnosis not present

## 2019-04-14 DIAGNOSIS — Z1231 Encounter for screening mammogram for malignant neoplasm of breast: Secondary | ICD-10-CM | POA: Diagnosis not present

## 2019-05-02 DIAGNOSIS — G4733 Obstructive sleep apnea (adult) (pediatric): Secondary | ICD-10-CM | POA: Diagnosis not present

## 2019-05-22 DIAGNOSIS — D3132 Benign neoplasm of left choroid: Secondary | ICD-10-CM | POA: Diagnosis not present

## 2019-05-22 DIAGNOSIS — H5203 Hypermetropia, bilateral: Secondary | ICD-10-CM | POA: Diagnosis not present

## 2019-05-22 DIAGNOSIS — H02889 Meibomian gland dysfunction of unspecified eye, unspecified eyelid: Secondary | ICD-10-CM | POA: Diagnosis not present

## 2019-05-22 DIAGNOSIS — H26491 Other secondary cataract, right eye: Secondary | ICD-10-CM | POA: Diagnosis not present

## 2019-05-22 DIAGNOSIS — H353132 Nonexudative age-related macular degeneration, bilateral, intermediate dry stage: Secondary | ICD-10-CM | POA: Diagnosis not present

## 2019-05-29 DIAGNOSIS — I6990 Unspecified sequelae of unspecified cerebrovascular disease: Secondary | ICD-10-CM | POA: Diagnosis not present

## 2019-05-29 DIAGNOSIS — I5032 Chronic diastolic (congestive) heart failure: Secondary | ICD-10-CM | POA: Diagnosis not present

## 2019-05-29 DIAGNOSIS — I480 Paroxysmal atrial fibrillation: Secondary | ICD-10-CM | POA: Diagnosis not present

## 2019-05-29 DIAGNOSIS — R7301 Impaired fasting glucose: Secondary | ICD-10-CM | POA: Diagnosis not present

## 2019-05-29 DIAGNOSIS — Z79899 Other long term (current) drug therapy: Secondary | ICD-10-CM | POA: Diagnosis not present

## 2019-05-29 DIAGNOSIS — Z23 Encounter for immunization: Secondary | ICD-10-CM | POA: Diagnosis not present

## 2019-05-29 DIAGNOSIS — E785 Hyperlipidemia, unspecified: Secondary | ICD-10-CM | POA: Diagnosis not present

## 2019-05-29 DIAGNOSIS — I10 Essential (primary) hypertension: Secondary | ICD-10-CM | POA: Diagnosis not present

## 2019-05-29 DIAGNOSIS — N183 Chronic kidney disease, stage 3 unspecified: Secondary | ICD-10-CM | POA: Diagnosis not present

## 2019-06-01 DIAGNOSIS — G4733 Obstructive sleep apnea (adult) (pediatric): Secondary | ICD-10-CM | POA: Diagnosis not present

## 2019-06-12 ENCOUNTER — Other Ambulatory Visit: Payer: Self-pay

## 2019-06-12 ENCOUNTER — Encounter (INDEPENDENT_AMBULATORY_CARE_PROVIDER_SITE_OTHER): Payer: Medicare HMO | Admitting: Ophthalmology

## 2019-06-12 DIAGNOSIS — H353112 Nonexudative age-related macular degeneration, right eye, intermediate dry stage: Secondary | ICD-10-CM | POA: Diagnosis not present

## 2019-06-12 DIAGNOSIS — H35033 Hypertensive retinopathy, bilateral: Secondary | ICD-10-CM

## 2019-06-12 DIAGNOSIS — H348312 Tributary (branch) retinal vein occlusion, right eye, stable: Secondary | ICD-10-CM

## 2019-06-12 DIAGNOSIS — H26491 Other secondary cataract, right eye: Secondary | ICD-10-CM | POA: Diagnosis not present

## 2019-06-12 DIAGNOSIS — H43813 Vitreous degeneration, bilateral: Secondary | ICD-10-CM | POA: Diagnosis not present

## 2019-06-12 DIAGNOSIS — H353221 Exudative age-related macular degeneration, left eye, with active choroidal neovascularization: Secondary | ICD-10-CM | POA: Diagnosis not present

## 2019-06-12 DIAGNOSIS — I10 Essential (primary) hypertension: Secondary | ICD-10-CM | POA: Diagnosis not present

## 2019-07-02 DIAGNOSIS — I671 Cerebral aneurysm, nonruptured: Secondary | ICD-10-CM | POA: Diagnosis not present

## 2019-07-02 DIAGNOSIS — I773 Arterial fibromuscular dysplasia: Secondary | ICD-10-CM | POA: Diagnosis not present

## 2019-07-02 DIAGNOSIS — F17220 Nicotine dependence, chewing tobacco, uncomplicated: Secondary | ICD-10-CM | POA: Diagnosis not present

## 2019-07-02 DIAGNOSIS — I725 Aneurysm of other precerebral arteries: Secondary | ICD-10-CM | POA: Diagnosis not present

## 2019-07-03 ENCOUNTER — Encounter (INDEPENDENT_AMBULATORY_CARE_PROVIDER_SITE_OTHER): Payer: Medicare HMO | Admitting: Ophthalmology

## 2019-07-03 DIAGNOSIS — H26491 Other secondary cataract, right eye: Secondary | ICD-10-CM

## 2019-07-14 DIAGNOSIS — G4733 Obstructive sleep apnea (adult) (pediatric): Secondary | ICD-10-CM | POA: Diagnosis not present

## 2019-07-30 DIAGNOSIS — N959 Unspecified menopausal and perimenopausal disorder: Secondary | ICD-10-CM | POA: Diagnosis not present

## 2019-07-30 DIAGNOSIS — Z1231 Encounter for screening mammogram for malignant neoplasm of breast: Secondary | ICD-10-CM | POA: Diagnosis not present

## 2019-07-30 DIAGNOSIS — M85852 Other specified disorders of bone density and structure, left thigh: Secondary | ICD-10-CM | POA: Diagnosis not present

## 2019-08-28 ENCOUNTER — Encounter (INDEPENDENT_AMBULATORY_CARE_PROVIDER_SITE_OTHER): Payer: Medicare HMO | Admitting: Ophthalmology

## 2019-09-01 DIAGNOSIS — M10071 Idiopathic gout, right ankle and foot: Secondary | ICD-10-CM | POA: Diagnosis not present

## 2019-09-01 DIAGNOSIS — I10 Essential (primary) hypertension: Secondary | ICD-10-CM | POA: Diagnosis not present

## 2019-09-01 DIAGNOSIS — I6990 Unspecified sequelae of unspecified cerebrovascular disease: Secondary | ICD-10-CM | POA: Diagnosis not present

## 2019-09-01 DIAGNOSIS — I5032 Chronic diastolic (congestive) heart failure: Secondary | ICD-10-CM | POA: Diagnosis not present

## 2019-09-01 DIAGNOSIS — Z79899 Other long term (current) drug therapy: Secondary | ICD-10-CM | POA: Diagnosis not present

## 2019-09-01 DIAGNOSIS — E785 Hyperlipidemia, unspecified: Secondary | ICD-10-CM | POA: Diagnosis not present

## 2019-09-01 DIAGNOSIS — I480 Paroxysmal atrial fibrillation: Secondary | ICD-10-CM | POA: Diagnosis not present

## 2019-09-01 DIAGNOSIS — R7301 Impaired fasting glucose: Secondary | ICD-10-CM | POA: Diagnosis not present

## 2019-09-01 DIAGNOSIS — Z139 Encounter for screening, unspecified: Secondary | ICD-10-CM | POA: Diagnosis not present

## 2019-09-01 DIAGNOSIS — N183 Chronic kidney disease, stage 3 unspecified: Secondary | ICD-10-CM | POA: Diagnosis not present

## 2019-09-04 ENCOUNTER — Encounter (INDEPENDENT_AMBULATORY_CARE_PROVIDER_SITE_OTHER): Payer: Medicare HMO | Admitting: Ophthalmology

## 2019-09-04 DIAGNOSIS — H43813 Vitreous degeneration, bilateral: Secondary | ICD-10-CM | POA: Diagnosis not present

## 2019-09-04 DIAGNOSIS — I10 Essential (primary) hypertension: Secondary | ICD-10-CM | POA: Diagnosis not present

## 2019-09-04 DIAGNOSIS — H353112 Nonexudative age-related macular degeneration, right eye, intermediate dry stage: Secondary | ICD-10-CM

## 2019-09-04 DIAGNOSIS — H348312 Tributary (branch) retinal vein occlusion, right eye, stable: Secondary | ICD-10-CM | POA: Diagnosis not present

## 2019-09-04 DIAGNOSIS — H353221 Exudative age-related macular degeneration, left eye, with active choroidal neovascularization: Secondary | ICD-10-CM | POA: Diagnosis not present

## 2019-09-04 DIAGNOSIS — H35033 Hypertensive retinopathy, bilateral: Secondary | ICD-10-CM

## 2019-10-03 DIAGNOSIS — M10071 Idiopathic gout, right ankle and foot: Secondary | ICD-10-CM | POA: Diagnosis not present

## 2019-10-16 DIAGNOSIS — M109 Gout, unspecified: Secondary | ICD-10-CM | POA: Diagnosis not present

## 2019-10-21 DIAGNOSIS — G4733 Obstructive sleep apnea (adult) (pediatric): Secondary | ICD-10-CM | POA: Diagnosis not present

## 2019-10-23 DIAGNOSIS — N1831 Chronic kidney disease, stage 3a: Secondary | ICD-10-CM | POA: Diagnosis not present

## 2019-10-23 DIAGNOSIS — M109 Gout, unspecified: Secondary | ICD-10-CM | POA: Diagnosis not present

## 2019-10-23 DIAGNOSIS — E559 Vitamin D deficiency, unspecified: Secondary | ICD-10-CM | POA: Diagnosis not present

## 2019-10-23 DIAGNOSIS — I129 Hypertensive chronic kidney disease with stage 1 through stage 4 chronic kidney disease, or unspecified chronic kidney disease: Secondary | ICD-10-CM | POA: Diagnosis not present

## 2019-10-23 DIAGNOSIS — D631 Anemia in chronic kidney disease: Secondary | ICD-10-CM | POA: Diagnosis not present

## 2019-10-30 DIAGNOSIS — N1831 Chronic kidney disease, stage 3a: Secondary | ICD-10-CM | POA: Diagnosis not present

## 2019-10-30 DIAGNOSIS — M109 Gout, unspecified: Secondary | ICD-10-CM | POA: Diagnosis not present

## 2019-10-30 DIAGNOSIS — E559 Vitamin D deficiency, unspecified: Secondary | ICD-10-CM | POA: Diagnosis not present

## 2019-10-30 DIAGNOSIS — N189 Chronic kidney disease, unspecified: Secondary | ICD-10-CM | POA: Diagnosis not present

## 2019-12-01 DIAGNOSIS — I480 Paroxysmal atrial fibrillation: Secondary | ICD-10-CM | POA: Diagnosis not present

## 2019-12-01 DIAGNOSIS — Z79899 Other long term (current) drug therapy: Secondary | ICD-10-CM | POA: Diagnosis not present

## 2019-12-01 DIAGNOSIS — I5032 Chronic diastolic (congestive) heart failure: Secondary | ICD-10-CM | POA: Diagnosis not present

## 2019-12-01 DIAGNOSIS — M10071 Idiopathic gout, right ankle and foot: Secondary | ICD-10-CM | POA: Diagnosis not present

## 2019-12-01 DIAGNOSIS — E785 Hyperlipidemia, unspecified: Secondary | ICD-10-CM | POA: Diagnosis not present

## 2019-12-01 DIAGNOSIS — I729 Aneurysm of unspecified site: Secondary | ICD-10-CM | POA: Diagnosis not present

## 2019-12-01 DIAGNOSIS — N183 Chronic kidney disease, stage 3 unspecified: Secondary | ICD-10-CM | POA: Diagnosis not present

## 2019-12-01 DIAGNOSIS — M109 Gout, unspecified: Secondary | ICD-10-CM | POA: Diagnosis not present

## 2019-12-01 DIAGNOSIS — R7301 Impaired fasting glucose: Secondary | ICD-10-CM | POA: Diagnosis not present

## 2019-12-04 ENCOUNTER — Encounter (INDEPENDENT_AMBULATORY_CARE_PROVIDER_SITE_OTHER): Payer: Medicare HMO | Admitting: Ophthalmology

## 2019-12-04 DIAGNOSIS — H43813 Vitreous degeneration, bilateral: Secondary | ICD-10-CM

## 2019-12-04 DIAGNOSIS — H34831 Tributary (branch) retinal vein occlusion, right eye, with macular edema: Secondary | ICD-10-CM | POA: Diagnosis not present

## 2019-12-04 DIAGNOSIS — H353221 Exudative age-related macular degeneration, left eye, with active choroidal neovascularization: Secondary | ICD-10-CM | POA: Diagnosis not present

## 2019-12-04 DIAGNOSIS — I10 Essential (primary) hypertension: Secondary | ICD-10-CM | POA: Diagnosis not present

## 2019-12-04 DIAGNOSIS — H353112 Nonexudative age-related macular degeneration, right eye, intermediate dry stage: Secondary | ICD-10-CM

## 2019-12-04 DIAGNOSIS — H35033 Hypertensive retinopathy, bilateral: Secondary | ICD-10-CM

## 2020-03-03 DIAGNOSIS — I6990 Unspecified sequelae of unspecified cerebrovascular disease: Secondary | ICD-10-CM | POA: Diagnosis not present

## 2020-03-03 DIAGNOSIS — N183 Chronic kidney disease, stage 3 unspecified: Secondary | ICD-10-CM | POA: Diagnosis not present

## 2020-03-03 DIAGNOSIS — I10 Essential (primary) hypertension: Secondary | ICD-10-CM | POA: Diagnosis not present

## 2020-03-03 DIAGNOSIS — Z79899 Other long term (current) drug therapy: Secondary | ICD-10-CM | POA: Diagnosis not present

## 2020-03-03 DIAGNOSIS — M10071 Idiopathic gout, right ankle and foot: Secondary | ICD-10-CM | POA: Diagnosis not present

## 2020-03-03 DIAGNOSIS — R7301 Impaired fasting glucose: Secondary | ICD-10-CM | POA: Diagnosis not present

## 2020-03-03 DIAGNOSIS — I5032 Chronic diastolic (congestive) heart failure: Secondary | ICD-10-CM | POA: Diagnosis not present

## 2020-03-03 DIAGNOSIS — E785 Hyperlipidemia, unspecified: Secondary | ICD-10-CM | POA: Diagnosis not present

## 2020-03-03 DIAGNOSIS — I480 Paroxysmal atrial fibrillation: Secondary | ICD-10-CM | POA: Diagnosis not present

## 2020-04-08 ENCOUNTER — Other Ambulatory Visit: Payer: Self-pay

## 2020-04-08 ENCOUNTER — Encounter (INDEPENDENT_AMBULATORY_CARE_PROVIDER_SITE_OTHER): Payer: Medicare HMO | Admitting: Ophthalmology

## 2020-04-08 DIAGNOSIS — H353112 Nonexudative age-related macular degeneration, right eye, intermediate dry stage: Secondary | ICD-10-CM | POA: Diagnosis not present

## 2020-04-08 DIAGNOSIS — H348312 Tributary (branch) retinal vein occlusion, right eye, stable: Secondary | ICD-10-CM

## 2020-04-08 DIAGNOSIS — H353221 Exudative age-related macular degeneration, left eye, with active choroidal neovascularization: Secondary | ICD-10-CM | POA: Diagnosis not present

## 2020-04-08 DIAGNOSIS — I10 Essential (primary) hypertension: Secondary | ICD-10-CM | POA: Diagnosis not present

## 2020-04-08 DIAGNOSIS — H43813 Vitreous degeneration, bilateral: Secondary | ICD-10-CM | POA: Diagnosis not present

## 2020-04-08 DIAGNOSIS — H35033 Hypertensive retinopathy, bilateral: Secondary | ICD-10-CM

## 2020-04-20 DIAGNOSIS — G4733 Obstructive sleep apnea (adult) (pediatric): Secondary | ICD-10-CM | POA: Diagnosis not present

## 2020-04-22 DIAGNOSIS — Z9181 History of falling: Secondary | ICD-10-CM | POA: Diagnosis not present

## 2020-04-22 DIAGNOSIS — Z1331 Encounter for screening for depression: Secondary | ICD-10-CM | POA: Diagnosis not present

## 2020-04-22 DIAGNOSIS — Z0000 Encounter for general adult medical examination without abnormal findings: Secondary | ICD-10-CM | POA: Diagnosis not present

## 2020-04-22 DIAGNOSIS — E785 Hyperlipidemia, unspecified: Secondary | ICD-10-CM | POA: Diagnosis not present

## 2020-06-03 DIAGNOSIS — Z79899 Other long term (current) drug therapy: Secondary | ICD-10-CM | POA: Diagnosis not present

## 2020-06-03 DIAGNOSIS — M109 Gout, unspecified: Secondary | ICD-10-CM | POA: Diagnosis not present

## 2020-06-03 DIAGNOSIS — I480 Paroxysmal atrial fibrillation: Secondary | ICD-10-CM | POA: Diagnosis not present

## 2020-06-03 DIAGNOSIS — N183 Chronic kidney disease, stage 3 unspecified: Secondary | ICD-10-CM | POA: Diagnosis not present

## 2020-06-03 DIAGNOSIS — E785 Hyperlipidemia, unspecified: Secondary | ICD-10-CM | POA: Diagnosis not present

## 2020-06-03 DIAGNOSIS — Z23 Encounter for immunization: Secondary | ICD-10-CM | POA: Diagnosis not present

## 2020-06-03 DIAGNOSIS — I6990 Unspecified sequelae of unspecified cerebrovascular disease: Secondary | ICD-10-CM | POA: Diagnosis not present

## 2020-06-03 DIAGNOSIS — I10 Essential (primary) hypertension: Secondary | ICD-10-CM | POA: Diagnosis not present

## 2020-06-03 DIAGNOSIS — I5032 Chronic diastolic (congestive) heart failure: Secondary | ICD-10-CM | POA: Diagnosis not present

## 2020-06-03 DIAGNOSIS — R7301 Impaired fasting glucose: Secondary | ICD-10-CM | POA: Diagnosis not present

## 2020-06-11 ENCOUNTER — Encounter: Payer: Self-pay | Admitting: *Deleted

## 2020-06-11 ENCOUNTER — Encounter: Payer: Self-pay | Admitting: Cardiology

## 2020-06-11 DIAGNOSIS — D638 Anemia in other chronic diseases classified elsewhere: Secondary | ICD-10-CM | POA: Insufficient documentation

## 2020-06-11 DIAGNOSIS — I6990 Unspecified sequelae of unspecified cerebrovascular disease: Secondary | ICD-10-CM | POA: Insufficient documentation

## 2020-06-11 DIAGNOSIS — M5030 Other cervical disc degeneration, unspecified cervical region: Secondary | ICD-10-CM | POA: Insufficient documentation

## 2020-06-11 DIAGNOSIS — M8580 Other specified disorders of bone density and structure, unspecified site: Secondary | ICD-10-CM | POA: Insufficient documentation

## 2020-06-11 DIAGNOSIS — I471 Supraventricular tachycardia: Secondary | ICD-10-CM | POA: Insufficient documentation

## 2020-06-11 DIAGNOSIS — F5104 Psychophysiologic insomnia: Secondary | ICD-10-CM | POA: Insufficient documentation

## 2020-06-11 DIAGNOSIS — K222 Esophageal obstruction: Secondary | ICD-10-CM | POA: Insufficient documentation

## 2020-06-11 DIAGNOSIS — H353190 Nonexudative age-related macular degeneration, unspecified eye, stage unspecified: Secondary | ICD-10-CM | POA: Insufficient documentation

## 2020-06-11 DIAGNOSIS — F419 Anxiety disorder, unspecified: Secondary | ICD-10-CM | POA: Insufficient documentation

## 2020-06-11 DIAGNOSIS — M810 Age-related osteoporosis without current pathological fracture: Secondary | ICD-10-CM | POA: Insufficient documentation

## 2020-06-11 DIAGNOSIS — N183 Chronic kidney disease, stage 3 unspecified: Secondary | ICD-10-CM | POA: Insufficient documentation

## 2020-06-11 DIAGNOSIS — K449 Diaphragmatic hernia without obstruction or gangrene: Secondary | ICD-10-CM | POA: Insufficient documentation

## 2020-06-11 DIAGNOSIS — N179 Acute kidney failure, unspecified: Secondary | ICD-10-CM | POA: Insufficient documentation

## 2020-06-11 DIAGNOSIS — N189 Chronic kidney disease, unspecified: Secondary | ICD-10-CM | POA: Insufficient documentation

## 2020-06-11 DIAGNOSIS — E559 Vitamin D deficiency, unspecified: Secondary | ICD-10-CM | POA: Insufficient documentation

## 2020-06-11 DIAGNOSIS — M109 Gout, unspecified: Secondary | ICD-10-CM | POA: Insufficient documentation

## 2020-06-11 DIAGNOSIS — K219 Gastro-esophageal reflux disease without esophagitis: Secondary | ICD-10-CM | POA: Insufficient documentation

## 2020-06-11 DIAGNOSIS — I509 Heart failure, unspecified: Secondary | ICD-10-CM | POA: Insufficient documentation

## 2020-06-21 NOTE — Progress Notes (Signed)
Cardiology Office Note:    Date:  06/22/2020   ID:  Teresa Hughes, DOB 1940-10-21, MRN [id number]  PCP:  Nicoletta Dress, MD  Cardiologist:  Shirlee More, MD   Referring MD: Nicoletta Dress, MD  ASSESSMENT:    1. Chronic diastolic congestive heart failure ([place])   2. Chronic atrial fibrillation (HCC)   3. High risk medication use   4. Chronic anticoagulation   5. Hypertensive heart disease without heart failure   6. Other hyperlipidemia   7. Nonrheumatic mitral valve regurgitation    PLAN:    In order of problems listed above:  1. Heart failure appears compensated she has no fluid overload continue her current loop diuretic I am concerned about the degree of mitral regurgitation ongoing symptoms of heart failure functional class II recheck echocardiogram and if she has severe functional mitral regurgitation could be considered for mitral clip.  Digoxin is appropriate for atrial fibrillation heart control in her subset with heart failure 2. Stable continue beta-blocker digoxin anticoagulant 3. Continue her statin lipids are ideal 4. Recheck echocardiogram if severe functional mitral regurgitation consider mitral clip  Next appointment 6 weeks after echocardiogram   Medication Adjustments/Labs and Tests Ordered: Current medicines are reviewed at length with the patient today.  Concerns regarding medicines are outlined above.  No orders of the defined types were placed in this encounter.  No orders of the defined types were placed in this encounter.    No chief complaint on file.   History of Present Illness:    Teresa Hughes is a 79 y.o. female who is being seen today for the evaluation of heart failure at the request of Nicoletta Dress, MD.  Records primary care physician office visit 06/04/2020 reviewed her history includes atrial fibrillation hypertension stroke heart failure and stage IV CKD.  Medications include digoxin furosemide Eliquis metoprolol and  simvastatin.  Recent laboratory test show cholesterol 148 LDL 36 triglycerides 643 HDL 23 CMP with elevated glucose 172 creatinine 0.99 GFR 54 cc/min potassium 3.8 and CBC shows a hemoglobin of 14.1 platelets normal 188,000 digoxin level 0.4.  I had seen her in January 2018 [facility] regional cardiology with a history of chronic atrial fibrillation.  At that time she had a recent stroke while off anticoagulation due to extensive bruising and maintained on warfarin and digoxin.  Her past history is noteworthy for previous GI bleed.  Echocardiogram at that time showed EF 60 to 65% severe left atrial enlargement mild aortic regurgitation moderate to severe mitral regurgitation and moderate tricuspid regurgitation.  She remembers me and I remember her.  She is sedentary since she had a stroke tells me she is short of breath when she walks outdoors but does housework but does not have shortness of breath at rest at nighttime with ADLs or walking inside the home.  Her weight is stable no edema she has no awareness of atrial fibrillation or GI side effects from digoxin and no bleeding with her anticoagulant.  No chest pain palpitation or syncope.  She is sodium restricts and is compliant with her diuretic. Past Medical History:  Diagnosis Date  . Acute kidney injury ([place])   . Anemia of chronic disease   . Aneurysm ([place]) 07/01/2018  . Anxiety   . Cerebral thrombosis with cerebral infarction ([place]) 05/07/2012  . CHF (congestive heart failure) ([place])   . Chronic anticoagulation 08/28/2016  . Chronic atrial fibrillation ([place]) 08/28/2016  . Chronic insomnia   . Chronic renal disease   .  DDD (degenerative disc disease), cervical   . Esophageal stricture   . GERD (gastroesophageal reflux disease)   . Gout   . Hiatal hernia   . High risk medication use 08/28/2016  . Hypercalcemia   . Hypertensive heart disease without heart failure 08/28/2016  . Late effects of CVA (cerebrovascular accident)   . Macular degeneration,  dry   . Osteopenia   . Osteoporosis   . Other hyperlipidemia 08/28/2016  . Paroxysmal SVT (supraventricular tachycardia) ([place])   . Vitamin D deficiency     Past Surgical History:  Procedure Laterality Date  . CATARACT EXTRACTION Bilateral    Right 04/02/17, Left 9/22018  . ERCP W/ SPHINCTEROTOMY AND BALLOON DILATION    . HEMORRHOID SURGERY  2016    Current Medications: Current Meds  Medication Sig  . alendronate (FOSAMAX) 70 MG tablet Take 70 mg by mouth once a week. Take with a full glass of water on an empty stomach.  Marland Kitchen allopurinol (ZYLOPRIM) 300 MG tablet Take 300 mg by mouth daily.  Marland Kitchen apixaban (ELIQUIS) 5 MG TABS tablet Take 5 mg by mouth 2 (two) times daily.  . digoxin (LANOXIN) 0.125 MG tablet Take 0.0625 mg by mouth daily.  . furosemide (LASIX) 40 MG tablet Take 40 mg by mouth daily.  . metoprolol tartrate (LOPRESSOR) 50 MG tablet Take 50 mg by mouth 2 (two) times daily.  Marland Kitchen omeprazole (PRILOSEC) 20 MG capsule Take 20 mg by mouth daily.  . potassium chloride SA (KLOR-CON) 20 MEQ tablet Take 20 mEq by mouth 2 (two) times daily.  . simvastatin (ZOCOR) 20 MG tablet Take 20 mg by mouth daily.     Allergies:   Propranolol, Sulfamethoxazole, Codeine, and Latex   Social History   Socioeconomic History  . Marital status: Unknown    Spouse name: Not on file  . Number of children: Not on file  . Years of education: Not on file  . Highest education level: Not on file  Occupational History  . Not on file  Tobacco Use  . Smoking status: Never Smoker  . Smokeless tobacco: Current User    Types: Snuff  Substance and Sexual Activity  . Alcohol use: Never  . Drug use: Never  . Sexual activity: Not on file  Other Topics Concern  . Not on file  Social History Narrative  . Not on file   Social Determinants of Health   Financial Resource Strain:   . Difficulty of Paying Living Expenses: Not on file  Food Insecurity:   . Worried About Charity fundraiser in the Last Year:  Not on file  . Ran Out of Food in the Last Year: Not on file  Transportation Needs:   . Lack of Transportation (Medical): Not on file  . Lack of Transportation (Non-Medical): Not on file  Physical Activity:   . Days of Exercise per Week: Not on file  . Minutes of Exercise per Session: Not on file  Stress:   . Feeling of Stress : Not on file  Social Connections:   . Frequency of Communication with Friends and Family: Not on file  . Frequency of Social Gatherings with Friends and Family: Not on file  . Attends Religious Services: Not on file  . Active Member of Clubs or Organizations: Not on file  . Attends Archivist Meetings: Not on file  . Marital Status: Not on file     Family History: The patient's family history includes Aneurysm in her maternal grandmother;  Congestive Heart Failure in her father; Diabetes in her mother; Heart attack in her brother, father, and sister; Heart disease in her brother, father, maternal grandfather, and maternal grandmother; Hypertension in her brother and mother; Skin cancer in her brother; Stroke in her maternal grandfather and mother.  ROS:   ROS Please see the history of present illness.     All other systems reviewed and are negative.  EKGs/Labs/Other Studies Reviewed:    The following studies were reviewed today:   EKG:  EKG is  ordered today.  The ekg ordered today is personally reviewed and demonstrates atrial fibrillation rate is reasonably controlled 100 210 bpm digitalis effect and 1 PVC   Physical Exam:    VS:  BP (!) 146/109   Pulse (!) 105   Ht 5\' 6"  (1.676 m)   Wt 124 lb 9.6 oz (56.5 kg)   SpO2 98%   BMI 20.11 kg/m     Wt Readings from Last 3 Encounters:  06/22/20 124 lb 9.6 oz (56.5 kg)  06/03/20 127 lb (57.6 kg)     GEN:  Well nourished, well developed in no acute distress HEENT: Normal NECK: No JVD; No carotid bruits LYMPHATICS: No lymphadenopathy CARDIAC: Irregular rhythm variable first heart sound  there is a loud grade 4/6 murmur of MR throughout the precordium S2 is normal rubs, gallops RESPIRATORY:  Clear to auscultation without rales, wheezing or rhonchi  ABDOMEN: Soft, non-tender, non-distended MUSCULOSKELETAL:  No edema; No deformity  SKIN: Warm and dry NEUROLOGIC:  Alert and oriented x 3 PSYCHIATRIC:  Normal affect     Signed, Shirlee More, MD  06/22/2020 8:14 AM    [place]

## 2020-06-22 ENCOUNTER — Encounter: Payer: Self-pay | Admitting: Cardiology

## 2020-06-22 ENCOUNTER — Ambulatory Visit: Payer: Medicare HMO | Admitting: Cardiology

## 2020-06-22 ENCOUNTER — Other Ambulatory Visit: Payer: Self-pay

## 2020-06-22 VITALS — BP 138/90 | HR 105 | Ht 66.0 in | Wt 124.6 lb

## 2020-06-22 DIAGNOSIS — I482 Chronic atrial fibrillation, unspecified: Secondary | ICD-10-CM | POA: Diagnosis not present

## 2020-06-22 DIAGNOSIS — Z7901 Long term (current) use of anticoagulants: Secondary | ICD-10-CM | POA: Diagnosis not present

## 2020-06-22 DIAGNOSIS — I5032 Chronic diastolic (congestive) heart failure: Secondary | ICD-10-CM

## 2020-06-22 DIAGNOSIS — I340 Nonrheumatic mitral (valve) insufficiency: Secondary | ICD-10-CM

## 2020-06-22 DIAGNOSIS — E7849 Other hyperlipidemia: Secondary | ICD-10-CM | POA: Diagnosis not present

## 2020-06-22 DIAGNOSIS — Z79899 Other long term (current) drug therapy: Secondary | ICD-10-CM | POA: Diagnosis not present

## 2020-06-22 DIAGNOSIS — I119 Hypertensive heart disease without heart failure: Secondary | ICD-10-CM | POA: Diagnosis not present

## 2020-06-22 NOTE — Patient Instructions (Signed)
Medication Instructions:  Your physician recommends that you continue on your current medications as directed. Please refer to the Current Medication list given to you today.  *If you need a refill on your cardiac medications before your next appointment, please call your pharmacy*   Lab Work: None If you have labs (blood work) drawn today and your tests are completely normal, you will receive your results only by: MyChart Message (if you have MyChart) OR A paper copy in the mail If you have any lab test that is abnormal or we need to change your treatment, we will call you to review the results.   Testing/Procedures: Your physician has requested that you have an echocardiogram. Echocardiography is a painless test that uses sound waves to create images of your heart. It provides your doctor with information about the size and shape of your heart and how well your heart's chambers and valves are working. This procedure takes approximately one hour. There are no restrictions for this procedure.    Follow-Up: At [facility], you and your health needs are our priority.  As part of our continuing mission to provide you with exceptional heart care, we have created designated Provider Care Teams.  These Care Teams include your primary Cardiologist (physician) and Advanced Practice Providers (APPs -  Physician Assistants and Nurse Practitioners) who all work together to provide you with the care you need, when you need it.  We recommend signing up for the patient portal called "MyChart".  Sign up information is provided on this After Visit Summary.  MyChart is used to connect with patients for Virtual Visits (Telemedicine).  Patients are able to view lab/test results, encounter notes, upcoming appointments, etc.  Non-urgent messages can be sent to your provider as well.   To learn more about what you can do with MyChart, go to [email or web address].    Your next appointment:   6 week(s)  The  format for your next appointment:   In Person  Provider:   Brian Munley, MD   Other Instructions   

## 2020-06-24 DIAGNOSIS — D3132 Benign neoplasm of left choroid: Secondary | ICD-10-CM | POA: Diagnosis not present

## 2020-06-24 DIAGNOSIS — H4322 Crystalline deposits in vitreous body, left eye: Secondary | ICD-10-CM | POA: Diagnosis not present

## 2020-06-24 DIAGNOSIS — H524 Presbyopia: Secondary | ICD-10-CM | POA: Diagnosis not present

## 2020-06-24 DIAGNOSIS — H26491 Other secondary cataract, right eye: Secondary | ICD-10-CM | POA: Diagnosis not present

## 2020-06-24 DIAGNOSIS — Z961 Presence of intraocular lens: Secondary | ICD-10-CM | POA: Diagnosis not present

## 2020-06-24 DIAGNOSIS — H43393 Other vitreous opacities, bilateral: Secondary | ICD-10-CM | POA: Diagnosis not present

## 2020-06-24 DIAGNOSIS — H353132 Nonexudative age-related macular degeneration, bilateral, intermediate dry stage: Secondary | ICD-10-CM | POA: Diagnosis not present

## 2020-06-24 DIAGNOSIS — H31113 Age-related choroidal atrophy, bilateral: Secondary | ICD-10-CM | POA: Diagnosis not present

## 2020-07-13 ENCOUNTER — Ambulatory Visit (INDEPENDENT_AMBULATORY_CARE_PROVIDER_SITE_OTHER): Payer: Medicare HMO

## 2020-07-13 ENCOUNTER — Other Ambulatory Visit: Payer: Self-pay

## 2020-07-13 ENCOUNTER — Telehealth: Payer: Self-pay

## 2020-07-13 DIAGNOSIS — Z79899 Other long term (current) drug therapy: Secondary | ICD-10-CM | POA: Diagnosis not present

## 2020-07-13 DIAGNOSIS — I340 Nonrheumatic mitral (valve) insufficiency: Secondary | ICD-10-CM

## 2020-07-13 DIAGNOSIS — I119 Hypertensive heart disease without heart failure: Secondary | ICD-10-CM | POA: Diagnosis not present

## 2020-07-13 DIAGNOSIS — I482 Chronic atrial fibrillation, unspecified: Secondary | ICD-10-CM

## 2020-07-13 DIAGNOSIS — I5032 Chronic diastolic (congestive) heart failure: Secondary | ICD-10-CM | POA: Diagnosis not present

## 2020-07-13 DIAGNOSIS — E7849 Other hyperlipidemia: Secondary | ICD-10-CM

## 2020-07-13 DIAGNOSIS — Z7901 Long term (current) use of anticoagulants: Secondary | ICD-10-CM | POA: Diagnosis not present

## 2020-07-13 LAB — ECHOCARDIOGRAM COMPLETE
Area-P 1/2: 4.36 cm2
Calc EF: 59.2 %
P 1/2 time: 387 msec
S' Lateral: 2.8 cm
Single Plane A2C EF: 62.7 %
Single Plane A4C EF: 54.4 %

## 2020-07-13 NOTE — Telephone Encounter (Signed)
Spoke with patient regarding results and recommendation.  Patient verbalizes understanding and is agreeable to plan of care. Advised patient to call back with any issues or concerns.   She states that she would like to wait and discuss this with Dr. Bettina Gavia during her office visit that is coming up. I told her that this would be fine with Dr. Bettina Gavia.

## 2020-07-13 NOTE — Telephone Encounter (Signed)
-----   Message from Richardo Priest, MD sent at 07/13/2020 12:25 PM EST ----- She has severe leakage across her mitral valve  It would be a good idea to have a better test called an esophageal echocardiogram be done at [facility] as an outpatient  If she is hesitant lets wait and we can discuss at her office appointment in December.

## 2020-07-13 NOTE — Progress Notes (Signed)
Complete echocardiogram performed.  Jimmy Jamiah Recore RDCS, RVT  

## 2020-07-16 DIAGNOSIS — N189 Chronic kidney disease, unspecified: Secondary | ICD-10-CM | POA: Insufficient documentation

## 2020-08-03 ENCOUNTER — Encounter: Payer: Self-pay | Admitting: Cardiology

## 2020-08-03 ENCOUNTER — Ambulatory Visit: Payer: Medicare HMO | Admitting: Cardiology

## 2020-08-03 ENCOUNTER — Other Ambulatory Visit: Payer: Self-pay

## 2020-08-03 VITALS — BP 150/86 | HR 68 | Ht 66.0 in | Wt 128.0 lb

## 2020-08-03 DIAGNOSIS — Z79899 Other long term (current) drug therapy: Secondary | ICD-10-CM | POA: Diagnosis not present

## 2020-08-03 DIAGNOSIS — I5032 Chronic diastolic (congestive) heart failure: Secondary | ICD-10-CM

## 2020-08-03 DIAGNOSIS — I119 Hypertensive heart disease without heart failure: Secondary | ICD-10-CM | POA: Diagnosis not present

## 2020-08-03 DIAGNOSIS — Z7901 Long term (current) use of anticoagulants: Secondary | ICD-10-CM

## 2020-08-03 DIAGNOSIS — E7849 Other hyperlipidemia: Secondary | ICD-10-CM

## 2020-08-03 DIAGNOSIS — I340 Nonrheumatic mitral (valve) insufficiency: Secondary | ICD-10-CM | POA: Diagnosis not present

## 2020-08-03 DIAGNOSIS — I482 Chronic atrial fibrillation, unspecified: Secondary | ICD-10-CM | POA: Diagnosis not present

## 2020-08-03 NOTE — Progress Notes (Signed)
Cardiology Office Note:    Date:  08/03/2020   ID:  Teresa Hughes, DOB Dec 09, 1940, MRN [id number]  PCP:  Nicoletta Dress, MD  Cardiologist:  Shirlee More, MD    Referring MD: Nicoletta Dress, MD    ASSESSMENT:    1. Nonrheumatic mitral valve regurgitation   2. Chronic diastolic congestive heart failure ([place])   3. High risk medication use   4. Chronic atrial fibrillation (HCC)   5. Chronic anticoagulation   6. Hypertensive heart disease without heart failure   7. Other hyperlipidemia    PLAN:    In order of problems listed above:  1. She has symptomatic severe mitral regurgitation secondary to mitral valve prolapse.  Hopefully she will come back and decide she wants intervention which case of the TEE done in [facility] refer to Dr. Roxy Manns. 2. He is compensated she has no fluid overload but is severely limited with reduced cardiac output continue digoxin loop diuretic 3. Rate controlled continue beta-blocker anticoagulant 4. Continue her statin   Next appointment: 2 weeks   Medication Adjustments/Labs and Tests Ordered: Current medicines are reviewed at length with the patient today.  Concerns regarding medicines are outlined above.  No orders of the defined types were placed in this encounter.  No orders of the defined types were placed in this encounter.   Complaint: Follow-up for mitral regurgitation is an echocardiogram mitral valve prolapse and severe valvular regurgitation  History of Present Illness:    Teresa Hughes is a 79 y.o. female with a hx of chronic diastolic heart failure, chronic atrial fibrillation on digoxin and anticoagulated, hypertensive heart disease hyperlipidemia and mitral regurgitation.  She was last seen 06/22/2020.Marland Kitchen Compliance with diet, lifestyle and medications: Yes  She is seen today to discuss transesophageal echocardiogram in preparation for consideration of mitral valve repair.  She is not feeling well she has profound  exercise intolerance when she goes to the store she has to stop and rest she has household activities she has to rest for hours afterwards he also has exertional shortness of breath no chest pain edema palpitation or syncope.  In short she is not pleased with the quality of her life we discussed ongoing medical treatment versus TEE and referral to CT surgery for consideration of mitral valve repair.  She is inclined to do that but wants to go home with her Christmas holiday speak to her family come back and follow-up the conversation in 2 weeks.  Echocardiogram performed 07/13/2020 showed EF of 60 to 65% severe left atrial enlargement moderate to severe mitral regurgitation with holosystolic prolapse of the posterior leaflet mild but mitral valve and concern for flail leaflet.  Also had mild enlargement of the ascending aorta.  Dependently reviewed the mitral regurgitation appears severe eccentric and reflux into the pulmonary vein Past Medical History:  Diagnosis Date  . Acute kidney injury ([place])   . Anemia of chronic disease   . Aneurysm ([place]) 07/01/2018  . Anxiety   . Cerebral thrombosis with cerebral infarction ([place]) 05/07/2012  . CHF (congestive heart failure) ([place])   . Chronic anticoagulation 08/28/2016  . Chronic atrial fibrillation ([place]) 08/28/2016  . Chronic insomnia   . Chronic renal disease   . DDD (degenerative disc disease), cervical   . Esophageal stricture   . GERD (gastroesophageal reflux disease)   . Gout   . Hiatal hernia   . High risk medication use 08/28/2016  . Hypercalcemia   . Hypertensive heart disease without heart failure  08/28/2016  . Late effects of CVA (cerebrovascular accident)   . Macular degeneration, dry   . Osteopenia   . Osteoporosis   . Other hyperlipidemia 08/28/2016  . Paroxysmal SVT (supraventricular tachycardia) ([place])   . Vitamin D deficiency     Past Surgical History:  Procedure Laterality Date  . CATARACT EXTRACTION Bilateral    Right 04/02/17,  Left 9/22018  . ERCP W/ SPHINCTEROTOMY AND BALLOON DILATION    . HEMORRHOID SURGERY  2016    Current Medications: No outpatient medications have been marked as taking for the 08/03/20 encounter (Appointment) with Richardo Priest, MD.     Allergies:   Propranolol, Sulfamethoxazole, Codeine, and Latex   Social History   Socioeconomic History  . Marital status: Unknown    Spouse name: Not on file  . Number of children: Not on file  . Years of education: Not on file  . Highest education level: Not on file  Occupational History  . Not on file  Tobacco Use  . Smoking status: Never Smoker  . Smokeless tobacco: Current User    Types: Snuff  Substance and Sexual Activity  . Alcohol use: Never  . Drug use: Never  . Sexual activity: Not on file  Other Topics Concern  . Not on file  Social History Narrative  . Not on file   Social Determinants of Health   Financial Resource Strain: Not on file  Food Insecurity: Not on file  Transportation Needs: Not on file  Physical Activity: Not on file  Stress: Not on file  Social Connections: Not on file     Family History: The patient's family history includes Aneurysm in her maternal grandmother; Congestive Heart Failure in her father; Diabetes in her mother; Heart attack in her brother, father, and sister; Heart disease in her brother, father, maternal grandfather, and maternal grandmother; Hypertension in her brother and mother; Skin cancer in her brother; Stroke in her maternal grandfather and mother. ROS:   Please see the history of present illness.    All other systems reviewed and are negative.  EKGs/Labs/Other Studies Reviewed:    The following studies were reviewed today:    Recent Labs: Recent laboratory test show cholesterol 148 LDL 36 triglycerides 643 HDL 23 CMP with elevated glucose 172 creatinine 0.99 GFR 54 cc/min potassium 3.8 and CBC shows a hemoglobin of 14.1 platelets normal 188,000 digoxin level 0.4.  Physical  Exam:    VS:  There were no vitals taken for this visit.    Wt Readings from Last 3 Encounters:  06/22/20 124 lb 9.6 oz (56.5 kg)  06/03/20 127 lb (57.6 kg)     GEN: She appears her age well nourished, well developed in no acute distress HEENT: Normal NECK: No JVD; No carotid bruits LYMPHATICS: No lymphadenopathy CARDIAC: Loud 4/6 murmur of mitral regurgitation throughout the precordium radiating to the back irregular rhythm, no murmurs, rubs, gallops RESPIRATORY:  Clear to auscultation without rales, wheezing or rhonchi  ABDOMEN: Soft, non-tender, non-distended MUSCULOSKELETAL:  No edema; No deformity  SKIN: Warm and dry NEUROLOGIC:  Alert and oriented x 3 PSYCHIATRIC:  Normal affect    Signed, Shirlee More, MD  08/03/2020 2:32 PM    [place]

## 2020-08-03 NOTE — Patient Instructions (Signed)
Medication Instructions:  No medication changes. *If you need a refill on your cardiac medications before your next appointment, please call your pharmacy*   Lab Work: None ordered If you have labs (blood work) drawn today and your tests are completely normal, you will receive your results only by: Marland Kitchen MyChart Message (if you have MyChart) OR . A paper copy in the mail If you have any lab test that is abnormal or we need to change your treatment, we will call you to review the results.   Testing/Procedures: None ordered   Follow-Up: At [facility], you and your health needs are our priority.  As part of our continuing mission to provide you with exceptional heart care, we have created designated Provider Care Teams.  These Care Teams include your primary Cardiologist (physician) and Advanced Practice Providers (APPs -  Physician Assistants and Nurse Practitioners) who all work together to provide you with the care you need, when you need it.  We recommend signing up for the patient portal called "MyChart".  Sign up information is provided on this After Visit Summary.  MyChart is used to connect with patients for Virtual Visits (Telemedicine).  Patients are able to view lab/test results, encounter notes, upcoming appointments, etc.  Non-urgent messages can be sent to your provider as well.   To learn more about what you can do with MyChart, go to [email or web address].    Your next appointment:   2 week(s)  The format for your next appointment:   In Person  Provider:   Shirlee More, MD   Other Instructions NA

## 2020-08-19 ENCOUNTER — Ambulatory Visit: Payer: Medicare HMO | Admitting: Cardiology

## 2020-08-19 ENCOUNTER — Encounter: Payer: Self-pay | Admitting: Cardiology

## 2020-08-19 ENCOUNTER — Other Ambulatory Visit: Payer: Self-pay

## 2020-08-19 VITALS — BP 142/82 | HR 72 | Ht 66.0 in | Wt 126.2 lb

## 2020-08-19 DIAGNOSIS — I119 Hypertensive heart disease without heart failure: Secondary | ICD-10-CM | POA: Diagnosis not present

## 2020-08-19 DIAGNOSIS — I5032 Chronic diastolic (congestive) heart failure: Secondary | ICD-10-CM | POA: Diagnosis not present

## 2020-08-19 DIAGNOSIS — K222 Esophageal obstruction: Secondary | ICD-10-CM | POA: Diagnosis not present

## 2020-08-19 DIAGNOSIS — Z79899 Other long term (current) drug therapy: Secondary | ICD-10-CM

## 2020-08-19 DIAGNOSIS — I482 Chronic atrial fibrillation, unspecified: Secondary | ICD-10-CM | POA: Diagnosis not present

## 2020-08-19 DIAGNOSIS — I340 Nonrheumatic mitral (valve) insufficiency: Secondary | ICD-10-CM

## 2020-08-19 DIAGNOSIS — Z7901 Long term (current) use of anticoagulants: Secondary | ICD-10-CM

## 2020-08-19 NOTE — Patient Instructions (Addendum)
Medication Instructions:  Your physician recommends that you continue on your current medications as directed. Please refer to the Current Medication list given to you today.  *If you need a refill on your cardiac medications before your next appointment, please call your pharmacy*   Lab Work: None If you have labs (blood work) drawn today and your tests are completely normal, you will receive your results only by: Marland Kitchen MyChart Message (if you have MyChart) OR . A paper copy in the mail If you have any lab test that is abnormal or we need to change your treatment, we will call you to review the results.   Testing/Procedures: We have put in the order for you to have a barium swallow study completed at [facility]. They will call you to schedule this appointment.    You are scheduled for a TEE on 09/14/2020  with Dr. Harrell Gave.  Please arrive at the [facility] (Main Entrance A) at [facility]: [address] [place], [place] [address] at 10 am.  DIET: Nothing to eat or drink after midnight except a sip of water with medications (see medication instructions below)  Medication Instructions:  Continue your anticoagulant: Eliquis You will need to continue your anticoagulant after your procedure until you are told by your Provider that it is safe to stop.   Labs: YOU HAD YOUR LABS DRAWN TODAY 09/08/2020  You must have a responsible person to drive you home and stay in the waiting area during your procedure. Failure to do so could result in cancellation.  Bring your insurance cards.  *Special Note: Every effort is made to have your procedure done on time. Occasionally there are emergencies that occur at the hospital that may cause delays. Please be patient if a delay does occur.     Follow-Up: At [facility], you and your health needs are our priority.  As part of our continuing mission to provide you with exceptional heart care, we have created designated Provider Care  Teams.  These Care Teams include your primary Cardiologist (physician) and Advanced Practice Providers (APPs -  Physician Assistants and Nurse Practitioners) who all work together to provide you with the care you need, when you need it.  We recommend signing up for the patient portal called "MyChart".  Sign up information is provided on this After Visit Summary.  MyChart is used to connect with patients for Virtual Visits (Telemedicine).  Patients are able to view lab/test results, encounter notes, upcoming appointments, etc.  Non-urgent messages can be sent to your provider as well.   To learn more about what you can do with MyChart, go to [email or web address].    Your next appointment:   6 week(s)  The format for your next appointment:   In Person  Provider:   Shirlee More, MD   Other Instructions

## 2020-08-19 NOTE — H&P (View-Only) (Signed)
Cardiology Office Note:    Date:  08/19/2020   ID:  Teresa Hughes, DOB 01/03/1941, MRN [id number]  PCP:  Nicoletta Dress, MD  Cardiologist:  Shirlee More, MD    Referring MD: Nicoletta Dress, MD she will see you next week in the office when you check labs please do a digoxin level CBC and CMP along with a lipid profile.  ASSESSMENT:    1. Nonrheumatic mitral valve regurgitation   2. Chronic atrial fibrillation (HCC)   3. Chronic anticoagulation   4. Chronic diastolic congestive heart failure ([place])   5. Hypertensive heart disease without heart failure   6. High risk medication use    PLAN:    In order of problems listed above:  1. She is ready to proceed with a TEE for consideration of intervention for severe symptomatic mitral regurgitation however she has a history of esophageal stricture remote dilation some trouble swallowing pills and will arrange for a upper GI/barium swallow to be performed at [facility] prior to scheduling the TEE.  Suspect she will require surgical intervention.  She had a barium swallow performed at [facility] 09/07/2020 that was normal there was no stricture or abnormal mucosa or motility. We'll go ahead and schedule her for a transesophageal echocardiogram.   2. Stable rate is controlled continue current rate suppression including digoxin beta-blocker and her anticoagulant. 3. Heart failure is compensated but she has marked exercise intolerance due to severe mitral regurgitation she has no fluid overload continue her current loop diuretic 4. I will ask her PCP next week when they check labs to check digoxin level goal of 1 to avoid excess mortality when used with atrial fibrillation   Next appointment: 6 weeks after TEE   Medication Adjustments/Labs and Tests Ordered: Current medicines are reviewed at length with the patient today.  Concerns regarding medicines are outlined above.  No orders of the defined types were placed in this  encounter.  No orders of the defined types were placed in this encounter.   Chief Complaint  Patient presents with  . Follow-up    To discuss scheduling transesophageal echocardiogram for mitral regurgitation    History of Present Illness:    Teresa Hughes is a 80 y.o. female with a hx of chronic atrial fibrillation anticoagulated hypertensive heart disease with chronic diastolic heart failure mitral regurgitation and hyper lipidemia.  She was last seen 08/03/2020.  I advised her to undergo transesophageal echocardiogram sideration of mitral valve repair as echocardiogram performed 07/13/2020 showed EF of 60 to 65% severe left atrial enlargement and moderate to severe mitral regurgitation holosystolic prolapse of the posterior leaflet and concern for flail.  Dependently reviewed the echocardiogram and I felt that the valvular regurgitation was severe with reflux into the pulmonary vein..  Compliance with diet, lifestyle and medications: Yes  She is stable since the last visit she is not short of breath at rest has no edema orthopnea but has marked exercise intolerance she has to stop and rest with ADLs and short of breath even walking indoors or outdoors and with any activity involving her upper extremities.  She is decided to proceed she has a history of esophageal stricture several years ago has some trouble swallowing pills and will have a barium swallow prior to being scheduled for her TEE.  He has had no bleeding complication she is followed with her primary care physician will have lab work performed next week and I will remind him Past Medical History:  Diagnosis Date  . Acute kidney injury ([place])   . Anemia of chronic disease   . Aneurysm ([place]) 07/01/2018  . Anxiety   . Cerebral thrombosis with cerebral infarction ([place]) 05/07/2012  . CHF (congestive heart failure) ([place])   . Chronic anticoagulation 08/28/2016  . Chronic atrial fibrillation ([place]) 08/28/2016  . Chronic insomnia   .  Chronic renal disease   . DDD (degenerative disc disease), cervical   . Esophageal stricture   . GERD (gastroesophageal reflux disease)   . Gout   . Hiatal hernia   . High risk medication use 08/28/2016  . Hypercalcemia   . Hypertensive heart disease without heart failure 08/28/2016  . Late effects of CVA (cerebrovascular accident)   . Macular degeneration, dry   . Osteopenia   . Osteoporosis   . Other hyperlipidemia 08/28/2016  . Paroxysmal SVT (supraventricular tachycardia) ([place])   . Vitamin D deficiency     Past Surgical History:  Procedure Laterality Date  . CATARACT EXTRACTION Bilateral    Right 04/02/17, Left 9/22018  . ERCP W/ SPHINCTEROTOMY AND BALLOON DILATION    . HEMORRHOID SURGERY  2016    Current Medications: Current Meds  Medication Sig  . alendronate (FOSAMAX) 70 MG tablet Take 70 mg by mouth once a week. Take with a full glass of water on an empty stomach.  Marland Kitchen allopurinol (ZYLOPRIM) 300 MG tablet Take 300 mg by mouth daily.  Marland Kitchen apixaban (ELIQUIS) 5 MG TABS tablet Take 5 mg by mouth 2 (two) times daily.  Marland Kitchen BIOTIN 5000 PO Take 1 tablet by mouth daily.  . digoxin (LANOXIN) 0.125 MG tablet Take 0.0625 mg by mouth daily.  . furosemide (LASIX) 40 MG tablet Take 40 mg by mouth daily.  . metoprolol tartrate (LOPRESSOR) 50 MG tablet Take 50 mg by mouth 2 (two) times daily.  . Multiple Vitamins-Minerals (ICAPS PO) Take by mouth in the morning and at bedtime.   Marland Kitchen omega-3 acid ethyl esters (LOVAZA) 1 g capsule Take 1 capsule by mouth daily.  Marland Kitchen omeprazole (PRILOSEC) 20 MG capsule Take 20 mg by mouth daily.  . potassium chloride SA (KLOR-CON) 20 MEQ tablet Take 20 mEq by mouth 2 (two) times daily.  . simvastatin (ZOCOR) 20 MG tablet Take 20 mg by mouth daily.  Marland Kitchen trimethoprim-polymyxin b (POLYTRIM) ophthalmic solution daily.  . Turmeric 500 MG TABS Take 1 tablet by mouth daily.     Allergies:   Propranolol, Sulfamethoxazole, Codeine, and Latex   Social History    Socioeconomic History  . Marital status: Unknown    Spouse name: Not on file  . Number of children: Not on file  . Years of education: Not on file  . Highest education level: Not on file  Occupational History  . Not on file  Tobacco Use  . Smoking status: Never Smoker  . Smokeless tobacco: Current User    Types: Snuff  Substance and Sexual Activity  . Alcohol use: Never  . Drug use: Never  . Sexual activity: Not on file  Other Topics Concern  . Not on file  Social History Narrative  . Not on file   Social Determinants of Health   Financial Resource Strain: Not on file  Food Insecurity: Not on file  Transportation Needs: Not on file  Physical Activity: Not on file  Stress: Not on file  Social Connections: Not on file     Family History: The patient's family history includes Aneurysm in her maternal grandmother; Congestive Heart Failure in  her father; Diabetes in her mother; Heart attack in her brother, father, and sister; Heart disease in her brother, father, maternal grandfather, and maternal grandmother; Hypertension in her brother and mother; Skin cancer in her brother; Stroke in her maternal grandfather and mother. ROS:   Please see the history of present illness.    All other systems reviewed and are negative.  EKGs/Labs/Other Studies Reviewed:    The following studies were reviewed today:  Recent laboratory test show cholesterol 148 LDL 36 triglycerides 643 HDL 23 CMP with elevated glucose 172 creatinine 0.99 GFR 54 cc/min potassium 3.8 and CBC shows a hemoglobin of 14.1 platelets normal 188,000 digoxin level 0.4.  Physical Exam:    VS:  BP (!) 142/82   Pulse 72   Ht 5\' 6"  (1.676 m)   Wt 126 lb 3.2 oz (57.2 kg)   SpO2 98%   BMI 20.37 kg/m     Wt Readings from Last 3 Encounters:  08/19/20 126 lb 3.2 oz (57.2 kg)  08/03/20 128 lb (58.1 kg)  06/22/20 124 lb 9.6 oz (56.5 kg)     GEN: She looks frail well nourished, well developed in no acute  distress HEENT: Normal NECK: No JVD; No carotid bruits LYMPHATICS: No lymphadenopathy CARDIAC: Irregular rhythm variable first heart sound she has a very loud murmur mitral regurgitation through the entire precordium radiating to the back RRR, no murmurs, rubs, gallops RESPIRATORY:  Clear to auscultation without rales, wheezing or rhonchi  ABDOMEN: Soft, non-tender, non-distended MUSCULOSKELETAL:  No edema; No deformity  SKIN: Warm and dry NEUROLOGIC:  Alert and oriented x 3 PSYCHIATRIC:  Normal affect    Signed, Shirlee More, MD  08/19/2020 9:37 AM    [place]

## 2020-08-19 NOTE — Progress Notes (Addendum)
Cardiology Office Note:    Date:  08/19/2020   ID:  Teresa Hughes, DOB Mar 29, 1941, MRN [id number]  PCP:  Nicoletta Dress, MD  Cardiologist:  Shirlee More, MD    Referring MD: Nicoletta Dress, MD she will see you next week in the office when you check labs please do a digoxin level CBC and CMP along with a lipid profile.  ASSESSMENT:    1. Nonrheumatic mitral valve regurgitation   2. Chronic atrial fibrillation (HCC)   3. Chronic anticoagulation   4. Chronic diastolic congestive heart failure ([place])   5. Hypertensive heart disease without heart failure   6. High risk medication use    PLAN:    In order of problems listed above:  1. She is ready to proceed with a TEE for consideration of intervention for severe symptomatic mitral regurgitation however she has a history of esophageal stricture remote dilation some trouble swallowing pills and will arrange for a upper GI/barium swallow to be performed at [facility] prior to scheduling the TEE.  Suspect she will require surgical intervention.  She had a barium swallow performed at [facility] 09/07/2020 that was normal there was no stricture or abnormal mucosa or motility. We'll go ahead and schedule her for a transesophageal echocardiogram.   2. Stable rate is controlled continue current rate suppression including digoxin beta-blocker and her anticoagulant. 3. Heart failure is compensated but she has marked exercise intolerance due to severe mitral regurgitation she has no fluid overload continue her current loop diuretic 4. I will ask her PCP next week when they check labs to check digoxin level goal of 1 to avoid excess mortality when used with atrial fibrillation   Next appointment: 6 weeks after TEE   Medication Adjustments/Labs and Tests Ordered: Current medicines are reviewed at length with the patient today.  Concerns regarding medicines are outlined above.  No orders of the defined types were placed in this  encounter.  No orders of the defined types were placed in this encounter.   Chief Complaint  Patient presents with  . Follow-up    To discuss scheduling transesophageal echocardiogram for mitral regurgitation    History of Present Illness:    Teresa Hughes is a 80 y.o. female with a hx of chronic atrial fibrillation anticoagulated hypertensive heart disease with chronic diastolic heart failure mitral regurgitation and hyper lipidemia.  She was last seen 08/03/2020.  I advised her to undergo transesophageal echocardiogram sideration of mitral valve repair as echocardiogram performed 07/13/2020 showed EF of 60 to 65% severe left atrial enlargement and moderate to severe mitral regurgitation holosystolic prolapse of the posterior leaflet and concern for flail.  Dependently reviewed the echocardiogram and I felt that the valvular regurgitation was severe with reflux into the pulmonary vein..  Compliance with diet, lifestyle and medications: Yes  She is stable since the last visit she is not short of breath at rest has no edema orthopnea but has marked exercise intolerance she has to stop and rest with ADLs and short of breath even walking indoors or outdoors and with any activity involving her upper extremities.  She is decided to proceed she has a history of esophageal stricture several years ago has some trouble swallowing pills and will have a barium swallow prior to being scheduled for her TEE.  He has had no bleeding complication she is followed with her primary care physician will have lab work performed next week and I will remind him Past Medical History:  Diagnosis Date  . Acute kidney injury ([place])   . Anemia of chronic disease   . Aneurysm ([place]) 07/01/2018  . Anxiety   . Cerebral thrombosis with cerebral infarction ([place]) 05/07/2012  . CHF (congestive heart failure) ([place])   . Chronic anticoagulation 08/28/2016  . Chronic atrial fibrillation ([place]) 08/28/2016  . Chronic insomnia   .  Chronic renal disease   . DDD (degenerative disc disease), cervical   . Esophageal stricture   . GERD (gastroesophageal reflux disease)   . Gout   . Hiatal hernia   . High risk medication use 08/28/2016  . Hypercalcemia   . Hypertensive heart disease without heart failure 08/28/2016  . Late effects of CVA (cerebrovascular accident)   . Macular degeneration, dry   . Osteopenia   . Osteoporosis   . Other hyperlipidemia 08/28/2016  . Paroxysmal SVT (supraventricular tachycardia) ([place])   . Vitamin D deficiency     Past Surgical History:  Procedure Laterality Date  . CATARACT EXTRACTION Bilateral    Right 04/02/17, Left 9/22018  . ERCP W/ SPHINCTEROTOMY AND BALLOON DILATION    . HEMORRHOID SURGERY  2016    Current Medications: Current Meds  Medication Sig  . alendronate (FOSAMAX) 70 MG tablet Take 70 mg by mouth once a week. Take with a full glass of water on an empty stomach.  Marland Kitchen allopurinol (ZYLOPRIM) 300 MG tablet Take 300 mg by mouth daily.  Marland Kitchen apixaban (ELIQUIS) 5 MG TABS tablet Take 5 mg by mouth 2 (two) times daily.  Marland Kitchen BIOTIN 5000 PO Take 1 tablet by mouth daily.  . digoxin (LANOXIN) 0.125 MG tablet Take 0.0625 mg by mouth daily.  . furosemide (LASIX) 40 MG tablet Take 40 mg by mouth daily.  . metoprolol tartrate (LOPRESSOR) 50 MG tablet Take 50 mg by mouth 2 (two) times daily.  . Multiple Vitamins-Minerals (ICAPS PO) Take by mouth in the morning and at bedtime.   Marland Kitchen omega-3 acid ethyl esters (LOVAZA) 1 g capsule Take 1 capsule by mouth daily.  Marland Kitchen omeprazole (PRILOSEC) 20 MG capsule Take 20 mg by mouth daily.  . potassium chloride SA (KLOR-CON) 20 MEQ tablet Take 20 mEq by mouth 2 (two) times daily.  . simvastatin (ZOCOR) 20 MG tablet Take 20 mg by mouth daily.  Marland Kitchen trimethoprim-polymyxin b (POLYTRIM) ophthalmic solution daily.  . Turmeric 500 MG TABS Take 1 tablet by mouth daily.     Allergies:   Propranolol, Sulfamethoxazole, Codeine, and Latex   Social History    Socioeconomic History  . Marital status: Unknown    Spouse name: Not on file  . Number of children: Not on file  . Years of education: Not on file  . Highest education level: Not on file  Occupational History  . Not on file  Tobacco Use  . Smoking status: Never Smoker  . Smokeless tobacco: Current User    Types: Snuff  Substance and Sexual Activity  . Alcohol use: Never  . Drug use: Never  . Sexual activity: Not on file  Other Topics Concern  . Not on file  Social History Narrative  . Not on file   Social Determinants of Health   Financial Resource Strain: Not on file  Food Insecurity: Not on file  Transportation Needs: Not on file  Physical Activity: Not on file  Stress: Not on file  Social Connections: Not on file     Family History: The patient's family history includes Aneurysm in her maternal grandmother; Congestive Heart Failure in  her father; Diabetes in her mother; Heart attack in her brother, father, and sister; Heart disease in her brother, father, maternal grandfather, and maternal grandmother; Hypertension in her brother and mother; Skin cancer in her brother; Stroke in her maternal grandfather and mother. ROS:   Please see the history of present illness.    All other systems reviewed and are negative.  EKGs/Labs/Other Studies Reviewed:    The following studies were reviewed today:  Recent laboratory test show cholesterol 148 LDL 36 triglycerides 643 HDL 23 CMP with elevated glucose 172 creatinine 0.99 GFR 54 cc/min potassium 3.8 and CBC shows a hemoglobin of 14.1 platelets normal 188,000 digoxin level 0.4.  Physical Exam:    VS:  BP (!) 142/82   Pulse 72   Ht 5\' 6"  (1.676 m)   Wt 126 lb 3.2 oz (57.2 kg)   SpO2 98%   BMI 20.37 kg/m     Wt Readings from Last 3 Encounters:  08/19/20 126 lb 3.2 oz (57.2 kg)  08/03/20 128 lb (58.1 kg)  06/22/20 124 lb 9.6 oz (56.5 kg)     GEN: She looks frail well nourished, well developed in no acute  distress HEENT: Normal NECK: No JVD; No carotid bruits LYMPHATICS: No lymphadenopathy CARDIAC: Irregular rhythm variable first heart sound she has a very loud murmur mitral regurgitation through the entire precordium radiating to the back RRR, no murmurs, rubs, gallops RESPIRATORY:  Clear to auscultation without rales, wheezing or rhonchi  ABDOMEN: Soft, non-tender, non-distended MUSCULOSKELETAL:  No edema; No deformity  SKIN: Warm and dry NEUROLOGIC:  Alert and oriented x 3 PSYCHIATRIC:  Normal affect    Signed, Shirlee More, MD  08/19/2020 9:37 AM    [place]

## 2020-08-26 ENCOUNTER — Encounter (INDEPENDENT_AMBULATORY_CARE_PROVIDER_SITE_OTHER): Payer: Medicare HMO | Admitting: Ophthalmology

## 2020-08-26 ENCOUNTER — Other Ambulatory Visit: Payer: Self-pay

## 2020-08-26 DIAGNOSIS — H353221 Exudative age-related macular degeneration, left eye, with active choroidal neovascularization: Secondary | ICD-10-CM

## 2020-08-26 DIAGNOSIS — H348312 Tributary (branch) retinal vein occlusion, right eye, stable: Secondary | ICD-10-CM | POA: Diagnosis not present

## 2020-08-26 DIAGNOSIS — I10 Essential (primary) hypertension: Secondary | ICD-10-CM | POA: Diagnosis not present

## 2020-08-26 DIAGNOSIS — H35033 Hypertensive retinopathy, bilateral: Secondary | ICD-10-CM

## 2020-08-26 DIAGNOSIS — H43813 Vitreous degeneration, bilateral: Secondary | ICD-10-CM | POA: Diagnosis not present

## 2020-08-26 DIAGNOSIS — H353112 Nonexudative age-related macular degeneration, right eye, intermediate dry stage: Secondary | ICD-10-CM

## 2020-09-06 ENCOUNTER — Telehealth: Payer: Self-pay | Admitting: Cardiology

## 2020-09-06 DIAGNOSIS — Z139 Encounter for screening, unspecified: Secondary | ICD-10-CM | POA: Diagnosis not present

## 2020-09-06 DIAGNOSIS — N183 Chronic kidney disease, stage 3 unspecified: Secondary | ICD-10-CM | POA: Diagnosis not present

## 2020-09-06 DIAGNOSIS — M109 Gout, unspecified: Secondary | ICD-10-CM | POA: Diagnosis not present

## 2020-09-06 DIAGNOSIS — I480 Paroxysmal atrial fibrillation: Secondary | ICD-10-CM | POA: Diagnosis not present

## 2020-09-06 DIAGNOSIS — R7301 Impaired fasting glucose: Secondary | ICD-10-CM | POA: Diagnosis not present

## 2020-09-06 DIAGNOSIS — E785 Hyperlipidemia, unspecified: Secondary | ICD-10-CM | POA: Diagnosis not present

## 2020-09-06 DIAGNOSIS — I6990 Unspecified sequelae of unspecified cerebrovascular disease: Secondary | ICD-10-CM | POA: Diagnosis not present

## 2020-09-06 DIAGNOSIS — I5032 Chronic diastolic (congestive) heart failure: Secondary | ICD-10-CM | POA: Diagnosis not present

## 2020-09-06 DIAGNOSIS — Z79899 Other long term (current) drug therapy: Secondary | ICD-10-CM | POA: Diagnosis not present

## 2020-09-06 DIAGNOSIS — I10 Essential (primary) hypertension: Secondary | ICD-10-CM | POA: Diagnosis not present

## 2020-09-06 NOTE — Telephone Encounter (Signed)
Called patient to make aware of appt for barium swallow test that has been scheduled for Tuesday, September 07, 2020 at 9:00 AM. Patient needs to arrive at 8:30 AM the outpatient center at [facility]. Patient did not answer/no voicemail to leave message.[name]

## 2020-09-07 ENCOUNTER — Encounter: Payer: Self-pay | Admitting: Cardiology

## 2020-09-07 DIAGNOSIS — Z8719 Personal history of other diseases of the digestive system: Secondary | ICD-10-CM | POA: Diagnosis not present

## 2020-09-07 DIAGNOSIS — R1310 Dysphagia, unspecified: Secondary | ICD-10-CM | POA: Diagnosis not present

## 2020-09-08 DIAGNOSIS — Z79899 Other long term (current) drug therapy: Secondary | ICD-10-CM | POA: Diagnosis not present

## 2020-09-08 DIAGNOSIS — K222 Esophageal obstruction: Secondary | ICD-10-CM | POA: Diagnosis not present

## 2020-09-08 DIAGNOSIS — I340 Nonrheumatic mitral (valve) insufficiency: Secondary | ICD-10-CM | POA: Diagnosis not present

## 2020-09-08 DIAGNOSIS — Z7901 Long term (current) use of anticoagulants: Secondary | ICD-10-CM | POA: Diagnosis not present

## 2020-09-08 DIAGNOSIS — I119 Hypertensive heart disease without heart failure: Secondary | ICD-10-CM | POA: Diagnosis not present

## 2020-09-08 DIAGNOSIS — I5032 Chronic diastolic (congestive) heart failure: Secondary | ICD-10-CM | POA: Diagnosis not present

## 2020-09-08 DIAGNOSIS — I482 Chronic atrial fibrillation, unspecified: Secondary | ICD-10-CM | POA: Diagnosis not present

## 2020-09-08 NOTE — Addendum Note (Signed)
Addended by: Shirlee More on: 09/08/2020 08:40 AM   Modules accepted: Orders, SmartSet

## 2020-09-08 NOTE — Addendum Note (Signed)
Addended by: Resa Miner I on: 09/08/2020 08:56 AM   Modules accepted: Orders

## 2020-09-09 ENCOUNTER — Telehealth: Payer: Self-pay

## 2020-09-09 LAB — BASIC METABOLIC PANEL
BUN/Creatinine Ratio: 19 (ref 12–28)
BUN: 18 mg/dL (ref 8–27)
CO2: 23 mmol/L (ref 20–29)
Calcium: 9.6 mg/dL (ref 8.7–10.3)
Chloride: 106 mmol/L (ref 96–106)
Creatinine, Ser: 0.96 mg/dL (ref 0.57–1.00)
GFR calc Af Amer: 64 mL/min/{1.73_m2} (ref >59)
GFR calc non Af Amer: 56 mL/min/{1.73_m2} — ABNORMAL LOW (ref >59)
Glucose: 88 mg/dL (ref 65–99)
Potassium: 4.1 mmol/L (ref 3.5–5.2)
Sodium: 143 mmol/L (ref 134–144)

## 2020-09-09 LAB — CBC
Hematocrit: 40.5 % (ref 34.0–46.6)
Hemoglobin: 13.8 g/dL (ref 11.1–15.9)
MCH: 31.4 pg (ref 26.6–33.0)
MCHC: 34.1 g/dL (ref 31.5–35.7)
MCV: 92 fL (ref 79–97)
Platelets: 201 10*3/uL (ref 150–450)
RBC: 4.4 x10E6/uL (ref 3.77–5.28)
RDW: 13.2 % (ref 11.7–15.4)
WBC: 6.2 10*3/uL (ref 3.4–10.8)

## 2020-09-09 NOTE — Telephone Encounter (Signed)
Spoke with patient regarding results and recommendation.  Patient verbalizes understanding and is agreeable to plan of care. Advised patient to call back with any issues or concerns.  

## 2020-09-09 NOTE — Telephone Encounter (Signed)
-----   Message from Richardo Priest, MD sent at 09/09/2020  7:58 AM EST ----- Good results for TEE

## 2020-09-11 ENCOUNTER — Other Ambulatory Visit (HOSPITAL_COMMUNITY)
Admission: RE | Admit: 2020-09-11 | Discharge: 2020-09-11 | Disposition: A | Discharge status: Home / Self Care | Payer: Medicare HMO | Source: Ambulatory Visit | Attending: Cardiology | Admitting: Cardiology

## 2020-09-11 DIAGNOSIS — Z01812 Encounter for preprocedural laboratory examination: Secondary | ICD-10-CM | POA: Insufficient documentation

## 2020-09-11 DIAGNOSIS — Z20822 Contact with and (suspected) exposure to covid-19: Secondary | ICD-10-CM | POA: Insufficient documentation

## 2020-09-11 LAB — SARS CORONAVIRUS 2 (TAT 6-24 HRS): SARS Coronavirus 2: NEGATIVE

## 2020-09-14 ENCOUNTER — Ambulatory Visit (HOSPITAL_COMMUNITY): Payer: Medicare HMO | Admitting: Anesthesiology

## 2020-09-14 ENCOUNTER — Ambulatory Visit (HOSPITAL_COMMUNITY)
Admission: RE | Admit: 2020-09-14 | Discharge: 2020-09-14 | Disposition: A | Discharge status: Home / Self Care | Payer: Medicare HMO | Attending: Cardiology | Admitting: Cardiology

## 2020-09-14 ENCOUNTER — Other Ambulatory Visit: Payer: Self-pay

## 2020-09-14 ENCOUNTER — Ambulatory Visit (HOSPITAL_BASED_OUTPATIENT_CLINIC_OR_DEPARTMENT_OTHER)
Admission: RE | Admit: 2020-09-14 | Discharge: 2020-09-14 | Disposition: A | Discharge status: Home / Self Care | Payer: Medicare HMO | Source: Ambulatory Visit | Attending: Cardiology | Admitting: Cardiology

## 2020-09-14 ENCOUNTER — Encounter (HOSPITAL_COMMUNITY): Payer: Self-pay | Admitting: Cardiology

## 2020-09-14 ENCOUNTER — Encounter (HOSPITAL_COMMUNITY)
Admission: RE | Disposition: A | Discharge status: Home / Self Care | Payer: Self-pay | Source: Home / Self Care | Attending: Cardiology

## 2020-09-14 DIAGNOSIS — Z885 Allergy status to narcotic agent status: Secondary | ICD-10-CM | POA: Diagnosis not present

## 2020-09-14 DIAGNOSIS — I313 Pericardial effusion (noninflammatory): Secondary | ICD-10-CM | POA: Insufficient documentation

## 2020-09-14 DIAGNOSIS — Z882 Allergy status to sulfonamides status: Secondary | ICD-10-CM | POA: Diagnosis not present

## 2020-09-14 DIAGNOSIS — N179 Acute kidney failure, unspecified: Secondary | ICD-10-CM | POA: Diagnosis not present

## 2020-09-14 DIAGNOSIS — I080 Rheumatic disorders of both mitral and aortic valves: Secondary | ICD-10-CM | POA: Diagnosis not present

## 2020-09-14 DIAGNOSIS — I340 Nonrheumatic mitral (valve) insufficiency: Secondary | ICD-10-CM | POA: Diagnosis not present

## 2020-09-14 DIAGNOSIS — I482 Chronic atrial fibrillation, unspecified: Secondary | ICD-10-CM | POA: Diagnosis not present

## 2020-09-14 DIAGNOSIS — I700 Atherosclerosis of aorta: Secondary | ICD-10-CM | POA: Insufficient documentation

## 2020-09-14 DIAGNOSIS — I110 Hypertensive heart disease with heart failure: Secondary | ICD-10-CM | POA: Diagnosis not present

## 2020-09-14 DIAGNOSIS — I341 Nonrheumatic mitral (valve) prolapse: Secondary | ICD-10-CM | POA: Diagnosis not present

## 2020-09-14 DIAGNOSIS — Z79899 Other long term (current) drug therapy: Secondary | ICD-10-CM | POA: Insufficient documentation

## 2020-09-14 DIAGNOSIS — Z7901 Long term (current) use of anticoagulants: Secondary | ICD-10-CM | POA: Diagnosis not present

## 2020-09-14 DIAGNOSIS — I509 Heart failure, unspecified: Secondary | ICD-10-CM | POA: Diagnosis not present

## 2020-09-14 DIAGNOSIS — I130 Hypertensive heart and chronic kidney disease with heart failure and stage 1 through stage 4 chronic kidney disease, or unspecified chronic kidney disease: Secondary | ICD-10-CM | POA: Diagnosis not present

## 2020-09-14 DIAGNOSIS — Z91040 Latex allergy status: Secondary | ICD-10-CM | POA: Insufficient documentation

## 2020-09-14 DIAGNOSIS — N183 Chronic kidney disease, stage 3 unspecified: Secondary | ICD-10-CM | POA: Diagnosis not present

## 2020-09-14 DIAGNOSIS — I5032 Chronic diastolic (congestive) heart failure: Secondary | ICD-10-CM | POA: Diagnosis not present

## 2020-09-14 HISTORY — PX: TEE WITHOUT CARDIOVERSION: SHX5443

## 2020-09-14 SURGERY — ECHOCARDIOGRAM, TRANSESOPHAGEAL
Anesthesia: Monitor Anesthesia Care

## 2020-09-14 MED ORDER — PROPOFOL 500 MG/50ML IV EMUL
INTRAVENOUS | Status: DC | PRN
  Administered 2020-09-14: 125 ug/kg/min via INTRAVENOUS

## 2020-09-14 MED ORDER — LACTATED RINGERS IV SOLN: Freq: Once | INTRAVENOUS | Status: AC

## 2020-09-14 MED ORDER — LIDOCAINE HCL (CARDIAC) PF 100 MG/5ML IV SOSY
PREFILLED_SYRINGE | INTRAVENOUS | Status: DC | PRN
  Administered 2020-09-14: 60 mg via INTRATRACHEAL

## 2020-09-14 MED ORDER — PROPOFOL 10 MG/ML IV BOLUS
INTRAVENOUS | Status: DC | PRN
  Administered 2020-09-14: 30 mg via INTRAVENOUS
  Administered 2020-09-14: 10 mg via INTRAVENOUS
  Administered 2020-09-14: 25 mg via INTRAVENOUS

## 2020-09-14 MED ORDER — SODIUM CHLORIDE 0.9 % IV SOLN: INTRAVENOUS | Status: DC

## 2020-09-14 MED ORDER — LACTATED RINGERS IV SOLN: INTRAVENOUS | Status: DC | PRN

## 2020-09-14 NOTE — Progress Notes (Signed)
  Echocardiogram Echocardiogram Transesophageal has been performed.  Teresa Hughes 09/14/2020, 11:45 AM

## 2020-09-14 NOTE — Anesthesia Procedure Notes (Signed)
Procedure Name: MAC Date/Time: 09/14/2020 11:02 AM Performed by: Kathryne Hitch, CRNA Pre-anesthesia Checklist: Emergency Drugs available, Patient identified, Suction available and Patient being monitored Patient Re-evaluated:Patient Re-evaluated prior to induction Oxygen Delivery Method: Nasal cannula Preoxygenation: Pre-oxygenation with 100% oxygen Induction Type: IV induction Placement Confirmation: positive ETCO2 Dental Injury: Teeth and Oropharynx as per pre-operative assessment

## 2020-09-14 NOTE — Discharge Instructions (Signed)

## 2020-09-14 NOTE — Transfer of Care (Signed)
Immediate Anesthesia Transfer of Care Note  Patient: Teresa Hughes  Procedure(s) Performed: TRANSESOPHAGEAL ECHOCARDIOGRAM (TEE) (N/A )  Patient Location: Endoscopy Unit  Anesthesia Type:MAC  Level of Consciousness: drowsy  Airway & Oxygen Therapy: Patient Spontanous Breathing and Patient connected to nasal cannula oxygen  Post-op Assessment: Report given to RN and Post -op Vital signs reviewed and stable  Post vital signs: Reviewed and stable  Last Vitals:  Vitals Value Taken Time  BP 88/54 09/14/20 1132  Temp    Pulse 62 09/14/20 1133  Resp 34 09/14/20 1133  SpO2 91 % 09/14/20 1133  Vitals shown include unvalidated device data.  Last Pain:  Vitals:   09/14/20 1021  TempSrc: Oral  PainSc: 0-No pain         Complications: No complications documented.

## 2020-09-14 NOTE — Anesthesia Preprocedure Evaluation (Addendum)
Anesthesia Evaluation  Patient identified by MRN, date of birth, ID band Patient awake    Reviewed: Allergy & Precautions, NPO status , Patient's Chart, lab work & pertinent test results  History of Anesthesia Complications Negative for: history of anesthetic complications  Airway Mallampati: II  TM Distance: >3 FB Neck ROM: Full    Dental  (+) Edentulous Upper, Missing,    Pulmonary neg pulmonary ROS,    Pulmonary exam normal        Cardiovascular hypertension, Pt. on medications and Pt. on home beta blockers +CHF  Normal cardiovascular exam+ dysrhythmias Atrial Fibrillation and Supra Ventricular Tachycardia   TTE 11/21: EF 60-65%, moderately elevated PASP, severe LAE, moderate to severe MR, moderate  holosystolic prolapse of the middle scallop of the posterior leaflet of the mitral valve, mild flail of posterior leaflet, mild to moderate TR, mild to moderate AR, mild dilatation of the ascending aorta measuring 39 mm    Neuro/Psych Anxiety CVA    GI/Hepatic Neg liver ROS, hiatal hernia, GERD  Medicated and Controlled,  Endo/Other  negative endocrine ROS  Renal/GU Renal InsufficiencyRenal disease  negative genitourinary   Musculoskeletal  (+) Arthritis ,   Abdominal   Peds  Hematology negative hematology ROS (+)   Anesthesia Other Findings Day of surgery medications reviewed with patient.  Reproductive/Obstetrics negative OB ROS                            Anesthesia Physical Anesthesia Plan  ASA: IV  Anesthesia Plan: MAC   Post-op Pain Management:    Induction:   PONV Risk Score and Plan: 2 and Treatment may vary due to age or medical condition and Propofol infusion  Airway Management Planned: Natural Airway and Nasal Cannula  Additional Equipment: None  Intra-op Plan:   Post-operative Plan:   Informed Consent: I have reviewed the patients History and Physical, chart, labs  and discussed the procedure including the risks, benefits and alternatives for the proposed anesthesia with the patient or authorized representative who has indicated his/her understanding and acceptance.       Plan Discussed with: CRNA  Anesthesia Plan Comments:        Anesthesia Quick Evaluation

## 2020-09-14 NOTE — Anesthesia Postprocedure Evaluation (Signed)
Anesthesia Post Note  Patient: Teresa Hughes  Procedure(s) Performed: TRANSESOPHAGEAL ECHOCARDIOGRAM (TEE) (N/A )     Patient location during evaluation: PACU Anesthesia Type: MAC Level of consciousness: awake and alert and oriented Pain management: pain level controlled Vital Signs Assessment: post-procedure vital signs reviewed and stable Respiratory status: spontaneous breathing, nonlabored ventilation and respiratory function stable Cardiovascular status: blood pressure returned to baseline Postop Assessment: no apparent nausea or vomiting Anesthetic complications: no   No complications documented.  Last Vitals:  Vitals:   09/14/20 1141 09/14/20 1151  BP: 102/66 126/76  Pulse: (!) 52 61  Resp: (!) 30 (!) 31  Temp:    SpO2: 90% 93%    Last Pain:  Vitals:   09/14/20 1151  TempSrc:   PainSc: 0-No pain                 Brennan Bailey

## 2020-09-14 NOTE — CV Procedure (Signed)
    TRANSESOPHAGEAL ECHOCARDIOGRAM   NAME:  Teresa Hughes   MRN: [id number] DOB:  [birth year]   ADMIT DATE: 09/14/2020  INDICATIONS: Mitral regurgitation  PROCEDURE:   Informed consent was obtained prior to the procedure. The risks, benefits and alternatives for the procedure were discussed and the patient comprehended these risks.  Risks include, but are not limited to, cough, sore throat, vomiting, nausea, somnolence, esophageal and stomach trauma or perforation, bleeding, low blood pressure, aspiration, pneumonia, infection, trauma to the teeth and death.    Procedural time out performed.  Patient received monitored anesthesia care under the supervision of Dr. Daiva Huge. Patient received a total of 198 mg propofol  And 60 mg lidocaine during the procedure.  The transesophageal probe was inserted in the esophagus and stomach without difficulty and multiple views were obtained.    COMPLICATIONS:    There were no immediate complications.  FINDINGS:  LEFT VENTRICLE: EF = 50-55%.  No regional wall motion abnormalities.  RIGHT VENTRICLE: Normal size and function.   LEFT ATRIUM: No thrombus/mass.  LEFT ATRIAL APPENDAGE: No thrombus/mass.   RIGHT ATRIUM: No thrombus/mass.  AORTIC VALVE:  Trileaflet. Mild regurgitation. No vegetation.  MITRAL VALVE:    There is a flail P2 leaflet with holosystolic prolapse. MR jet wraps entire atrium in an eccentric manner, consistent with severe MR. There is prolapse of a small chordal structure and there is also an independently mobile chord in the subvalvular apparatus. This may be cause of flail. Leaflets are mildly thickened. No vegetation seen.  TRICUSPID VALVE: Normal structure. Mild regurgitation. No vegetation.  PULMONIC VALVE: Grossly normal structure. Mild regurgitation. No apparent vegetation.  INTERATRIAL SEPTUM: No PFO or ASD seen by color Doppler, but this may be obscured by the eccentric MR jet in the left atrium and the TR jet on the  right side.  PERICARDIUM: Small effusion noted.  DESCENDING AORTA: Moderate diffuse plaque seen   CONCLUSION: Flail P2 segment of mitral valve with holosystolic prolapse and severe MR. Consider referral for mitraclip evaluation.   Buford Dresser, MD, PhD [facility]  [address] [place], [place] [address] [phone]   11:28 AM

## 2020-09-14 NOTE — Interval H&P Note (Signed)
History and Physical Interval Note:  09/14/2020 10:59 AM  Teresa Hughes  has presented today for surgery, with the diagnosis of [place].  The various methods of treatment have been discussed with the patient and family. After consideration of risks, benefits and other options for treatment, the patient has consented to  Procedure(s): TRANSESOPHAGEAL ECHOCARDIOGRAM (TEE) (N/A) as a surgical intervention.  The patient's history has been reviewed, patient examined, no change in status, stable for surgery.  I have reviewed the patient's chart and labs.  Questions were answered to the patient's satisfaction.     Buford Dresser  I spoke at length with the patient regarding the procedure, risk/benefit. She does have prior history of esophageal dilation, not in several years. No recent issues with dysphagia or regurgitation. Specifically discussed the risk of esophageal trauma or perforation. Also discussed that we will abort the procedure if we encounter resistance. She understands and wishes to continue with the procedure.

## 2020-09-15 ENCOUNTER — Telehealth: Payer: Self-pay

## 2020-09-15 ENCOUNTER — Encounter (HOSPITAL_COMMUNITY): Payer: Self-pay | Admitting: Cardiology

## 2020-09-15 NOTE — Telephone Encounter (Signed)
Spoke with patient regarding results and recommendation.  Patient verbalizes understanding and is agreeable to plan of care. Advised patient to call back with any issues or concerns.  

## 2020-09-15 NOTE — Telephone Encounter (Signed)
-----   Message from Richardo Priest, MD sent at 09/15/2020  7:50 AM EST ----- I spoke with Dr. Gerrit Halls yesterday who does the mitral valve clipping  Her valve has severe leakage or mitral regurgitation and we think that it would be amenable to clipping the valve rather than open heart surgery  He will contact her regarding follow-up.

## 2020-09-16 NOTE — Progress Notes (Signed)
Blanch Stang DOB 2041-07-28  This looks like a clippable valve. The fossa looks approachable for transseptal puncture in both the SAXB and Bicaval views. LA dimensions are large enough for device steering and straddle. The eccentric MR jet is caused by a P2 flail (33 degree). The posterior leaflet measures about 1.62 cm in the 126 LVOT view. MVA measures about 5.14 cm2 in from the Trangastric view. I would like to verify the gradient and MVA in the case prior to start. Based on this information, I'd recommend starting with an XTW and assessing for gradient.

## 2020-09-24 DIAGNOSIS — Z1231 Encounter for screening mammogram for malignant neoplasm of breast: Secondary | ICD-10-CM | POA: Diagnosis not present

## 2020-09-29 NOTE — Progress Notes (Signed)
Cardiology Office Note:    Date:  09/30/2020   ID:  Teresa Hughes, DOB 05/23/1941, MRN [id number]  PCP:  Nicoletta Dress, MD  Cardiologist:  Shirlee More, MD    Referring MD: Nicoletta Dress, MD    ASSESSMENT:    1. Nonrheumatic mitral valve regurgitation   2. Hypertensive heart disease without heart failure   3. Chronic atrial fibrillation ([place])   4. Chronic anticoagulation   5. High risk medication use    PLAN:    In order of problems listed above:  1. She has severe mitral regurgitation with mitral valve prolapse particularly involving P2.  She has compensated heart failure.  She is not frail.  She is awaiting evaluation by structural heart and is enthusiastic about intervention with mitral clip. 2. Presently stable she has no fluid overload continue her current loop diuretic as well as beta-blocker and digoxin for heart rate control. 3. Compliant with and continue her current diuretic 4. Stable no findings of digoxin toxicity   Next appointment: I will see her after evaluation and treatment by the structural heart team   Medication Adjustments/Labs and Tests Ordered: Current medicines are reviewed at length with the patient today.  Concerns regarding medicines are outlined above.  No orders of the defined types were placed in this encounter.  No orders of the defined types were placed in this encounter.   Chief Complaint  Patient presents with  . Follow-up  . Mitral Regurgitation    History of Present Illness:    Teresa Hughes is a 80 y.o. female with a hx of chronic atrial fibrillation anticoagulated hypertensive heart disease with chronic diastolic heart failure hyperlipidemia and severe mitral regurgitation with echocardiogram showing holosystolic prolapse.  She had recent transesophageal echocardiogram 09/14/2018 to [facility] left ventricular systolic function ejection fraction normal at 50 to 55% the right ventricle is normal.  She had  severe mitral regurgitation with severe holosystolic prolapse of the middle scallop of the posterior leaflet of the mitral valve.  She also had mild aortic regurgitation and moderate atherosclerosis in the descending thoracic aorta.  P2 leaflet was described as flail.  I reviewed the case with Dr. Burt Knack structural heart who felt this was a good candidate for mitral clip.  I had reviewed the case with Dr. Burt Knack structural heart who felt she was a good candidate for MitraClip  Compliance with diet, lifestyle and medications: Yes  She has an appointment structural heart team 10/20/2020 regarding MitraClip. I reviewed her TEE findings with her I discussed the case with Dr. Burt Knack and she appears to be a good candidate. She has severe symptomatic mitral regurgitation with heart failure. Although she is not edematous she remains weak and short of breath with activities like walking in the hallway indoors light housework but not with ADLs.  At times she has orthopnea but has not had PND.  Since we added digoxin there is no awareness of atrial fibrillation no chest pain or syncope.  She has had no bleeding complication of her anticoagulant. She has a good quality of life and she has not frail. Past Medical History:  Diagnosis Date  . Acute kidney injury ([place])   . Anemia of chronic disease   . Aneurysm ([place]) 07/01/2018  . Anxiety   . Cerebral thrombosis with cerebral infarction ([place]) 05/07/2012  . CHF (congestive heart failure) ([place])   . Chronic anticoagulation 08/28/2016  . Chronic atrial fibrillation ([place]) 08/28/2016  . Chronic insomnia   .  Chronic renal disease   . DDD (degenerative disc disease), cervical   . Esophageal stricture   . GERD (gastroesophageal reflux disease)   . Gout   . Hiatal hernia   . High risk medication use 08/28/2016  . Hypercalcemia   . Hypertensive heart disease without heart failure 08/28/2016  . Late effects of CVA (cerebrovascular accident)   . Macular degeneration,  dry   . Osteopenia   . Osteoporosis   . Other hyperlipidemia 08/28/2016  . Paroxysmal SVT (supraventricular tachycardia) ([place])   . Vitamin D deficiency     Past Surgical History:  Procedure Laterality Date  . CATARACT EXTRACTION Bilateral    Right 04/02/17, Left 9/22018  . ERCP W/ SPHINCTEROTOMY AND BALLOON DILATION    . HEMORRHOID SURGERY  2016  . TEE WITHOUT CARDIOVERSION N/A 09/14/2020   Procedure: TRANSESOPHAGEAL ECHOCARDIOGRAM (TEE);  Surgeon: Buford Dresser, MD;  Location: [facility] ENDOSCOPY;  Service: Cardiovascular;  Laterality: N/A;    Current Medications: Current Meds  Medication Sig  . alendronate (FOSAMAX) 70 MG tablet Take 70 mg by mouth once a week. Take with a full glass of water on an empty stomach.  Marland Kitchen allopurinol (ZYLOPRIM) 300 MG tablet Take 300 mg by mouth daily.  Marland Kitchen apixaban (ELIQUIS) 5 MG TABS tablet Take 5 mg by mouth 2 (two) times daily.  Marland Kitchen BIOTIN 5000 PO Take 5,000 Units by mouth daily.  . Carboxymethylcellulose Sodium (EYE DROPS OP) Apply 1 drop to eye daily. Vitamin drops  . digoxin (LANOXIN) 0.125 MG tablet Take 0.0625 mg by mouth daily.  . furosemide (LASIX) 40 MG tablet Take 40 mg by mouth daily.  . metoprolol tartrate (LOPRESSOR) 50 MG tablet Take 50 mg by mouth 2 (two) times daily.  . mirtazapine (REMERON) 15 MG tablet Take 7.5 mg by mouth at bedtime.  . Multiple Vitamins-Minerals (PRESERVISION AREDS 2) CAPS Apply 1 capsule to eye daily.  Marland Kitchen omega-3 acid ethyl esters (LOVAZA) 1 g capsule Take 2 g by mouth 2 (two) times daily.  Marland Kitchen omeprazole (PRILOSEC) 20 MG capsule Take 20 mg by mouth daily as needed (Heartburn).  . potassium chloride SA (KLOR-CON) 20 MEQ tablet Take 20 mEq by mouth 2 (two) times daily.  . simvastatin (ZOCOR) 20 MG tablet Take 20 mg by mouth daily.  . Turmeric 500 MG TABS Take 500 mg by mouth daily.     Allergies:   Propranolol, Sulfamethoxazole, Codeine, and Latex   Social History   Socioeconomic History  . Marital status: Unknown     Spouse name: Not on file  . Number of children: Not on file  . Years of education: Not on file  . Highest education level: Not on file  Occupational History  . Not on file  Tobacco Use  . Smoking status: Never Smoker  . Smokeless tobacco: Current User    Types: Snuff  Substance and Sexual Activity  . Alcohol use: Never  . Drug use: Never  . Sexual activity: Not on file  Other Topics Concern  . Not on file  Social History Narrative  . Not on file   Social Determinants of Health   Financial Resource Strain: Not on file  Food Insecurity: Not on file  Transportation Needs: Not on file  Physical Activity: Not on file  Stress: Not on file  Social Connections: Not on file     Family History: The patient's family history includes Aneurysm in her maternal grandmother; Congestive Heart Failure in her father; Diabetes in her mother; Heart attack  in her brother, father, and sister; Heart disease in her brother, father, maternal grandfather, and maternal grandmother; Hypertension in her brother and mother; Skin cancer in her brother; Stroke in her maternal grandfather and mother. ROS:   Please see the history of present illness.    All other systems reviewed and are negative.  EKGs/Labs/Other Studies Reviewed:    The following studies were reviewed today:    Recent Labs: 09/08/2020: BUN 18; Creatinine, Ser 0.96; Hemoglobin 13.8; Platelets 201; Potassium 4.1; Sodium 143  Recent Lipid Panel No results found for: CHOL, TRIG, HDL, CHOLHDL, VLDL, LDLCALC, LDLDIRECT  Physical Exam:    VS:  BP 128/70   Pulse 88   Ht 5\' 6"  (1.676 m)   Wt 125 lb 9.6 oz (57 kg)   SpO2 97%   BMI 20.27 kg/m     Wt Readings from Last 3 Encounters:  09/30/20 125 lb 9.6 oz (57 kg)  09/14/20 125 lb (56.7 kg)  08/19/20 126 lb 3.2 oz (57.2 kg)     GEN: Alert does not appear frail well nourished, well developed in no acute distress HEENT: Normal NECK: No JVD; No carotid bruits LYMPHATICS: No  lymphadenopathy CARDIAC: 3 of 6 MR heard throughout the precordium and into the left axilla.  Rhythm is irregular S1 variable  RESPIRATORY:  Clear to auscultation without rales, wheezing or rhonchi  ABDOMEN: Soft, non-tender, non-distended MUSCULOSKELETAL:  No edema; No deformity  SKIN: Warm and dry NEUROLOGIC:  Alert and oriented x 3 PSYCHIATRIC:  Normal affect    Signed, Shirlee More, MD  09/30/2020 10:46 AM    Standard

## 2020-09-30 ENCOUNTER — Ambulatory Visit: Payer: Medicare HMO | Admitting: Cardiology

## 2020-09-30 ENCOUNTER — Encounter: Payer: Self-pay | Admitting: Cardiology

## 2020-09-30 ENCOUNTER — Other Ambulatory Visit: Payer: Self-pay

## 2020-09-30 VITALS — BP 128/70 | HR 88 | Ht 66.0 in | Wt 125.6 lb

## 2020-09-30 DIAGNOSIS — I482 Chronic atrial fibrillation, unspecified: Secondary | ICD-10-CM

## 2020-09-30 DIAGNOSIS — Z79899 Other long term (current) drug therapy: Secondary | ICD-10-CM

## 2020-09-30 DIAGNOSIS — Z7901 Long term (current) use of anticoagulants: Secondary | ICD-10-CM | POA: Diagnosis not present

## 2020-09-30 DIAGNOSIS — I119 Hypertensive heart disease without heart failure: Secondary | ICD-10-CM

## 2020-09-30 DIAGNOSIS — I340 Nonrheumatic mitral (valve) insufficiency: Secondary | ICD-10-CM

## 2020-09-30 NOTE — Patient Instructions (Signed)

## 2020-10-20 ENCOUNTER — Encounter: Payer: Self-pay | Admitting: Cardiovascular Disease

## 2020-10-20 ENCOUNTER — Ambulatory Visit: Payer: Medicare HMO | Admitting: Cardiovascular Disease

## 2020-10-20 ENCOUNTER — Other Ambulatory Visit: Payer: Self-pay

## 2020-10-20 VITALS — BP 112/80 | HR 86 | Ht 66.0 in | Wt 123.0 lb

## 2020-10-20 DIAGNOSIS — I340 Nonrheumatic mitral (valve) insufficiency: Secondary | ICD-10-CM

## 2020-10-20 NOTE — Patient Instructions (Addendum)
Medication Instructions:  Your provider recommends that you continue on your current medications as directed. Please refer to the Current Medication list given to you today.   *If you need a refill on your cardiac medications before your next appointment, please call your pharmacy*  Testing/Procedures: Your physician has requested that you have a cardiac catheterization. Cardiac catheterization is used to diagnose and/or treat various heart conditions. Doctors may recommend this procedure for a number of different reasons. The most common reason is to evaluate chest pain. Chest pain can be a symptom of coronary artery disease (CAD), and cardiac catheterization can show whether plaque is narrowing or blocking your heart's arteries. This procedure is also used to evaluate the valves, as well as measure the blood flow and oxygen levels in different parts of your heart. For further information please visit [email or web address]. Please follow instruction sheet, as given.   COVID SCREENING INFORMATION (10/27/2020): You are scheduled for your drive-thru COVID screening on: 10/27/2020 between 1PM and 2PM. Pre-Procedural COVID-19 Testing Site [address] [place], [place] [address] You will need to go home after your screening and quarantine until your procedure.   CATHETERIZATION INSTRUCTIONS (10/29/2020): You are scheduled for a Cardiac Catheterization on: Friday, October 29, 2020 with Dr. [name].  1. Please arrive at the [facility] (Main Entrance A) at [facility]: [address] [place], [place] [address] at: 5:30AM (This time is two hours before your procedure to ensure your preparation). Free valet parking service is available. You are allowed ONE visitor in the waiting room during your procedure. Both you and your guest must wear masks. Special note: Every effort is made to have your procedure done on time. Please understand that emergencies sometimes delay scheduled procedures.  2.  Diet: Do not eat solid foods after midnight.  The patient may have clear liquids until 5am upon the day of the procedure.  3. Labs: TODAY! BMET, CBC  4. Medication instructions in preparation for your procedure:  1) HOLD LASIX the morning of your procedure  2) You will be called to confirm your ELIQUIS instructions  3) You may take your other medications as directed with sips of water   5. Plan for one night stay--bring personal belongings. 6. Bring a current list of your medications and current insurance cards. 7. You MUST have a responsible person to drive you home. 8. Someone MUST be with you the first 24 hours after you arrive home or your discharge will be delayed. 9. Please wear clothes that are easy to get on and off and wear slip-on shoes.  Thank you for allowing [place] to care for you!   -- [facility]

## 2020-10-20 NOTE — H&P (View-Only) (Signed)
[facility]   MULTIDISCIPLINARY HEART VALVE TEAM  Date:  10/24/2020   ID:  MCKALA PANTALEON, DOB 06-27-1941, MRN [id number]  PCP:  Nicoletta Dress, MD   Chief Complaint  Patient presents with  . Mitral Regurgitation     HISTORY OF PRESENT ILLNESS: Teresa Hughes is a 80 y.o. female who presents for evaluation of mitral regurgitation, referred by Dr Bettina Gavia.  She is here with daughter today. The patient is widowed since 2016. She has been living independently since that time. She is relatively active and still drives a car.   She has been dyspneic with activity for about 3 years. She notes that her dyspnea has worsened over the past year. She denies chest pain, pressure, orthopnea, PND, or leg swelling. She is now short of breath with very low level activity such as walking short distances on level ground. She has known of a heart murmur for over 10 years but was not told of this when she was much younger, I.e. during pregnancy. she has no hx of rheumatic fever.    The patient has not had regular dental care and reports most of her teeth have been out. She has 5 remaining lower teeth. She has avoided having them extracted because of her stroke history when her warfarin was discontinued in the past.   Past Medical History:  Diagnosis Date  . Acute kidney injury ([place])   . Anemia of chronic disease   . Aneurysm ([place]) 07/01/2018  . Anxiety   . Cerebral thrombosis with cerebral infarction ([place]) 05/07/2012  . CHF (congestive heart failure) ([place])   . Chronic anticoagulation 08/28/2016  . Chronic atrial fibrillation ([place]) 08/28/2016  . Chronic insomnia   . Chronic renal disease   . DDD (degenerative disc disease), cervical   . Esophageal stricture   . GERD (gastroesophageal reflux disease)   . Gout   . Hiatal hernia   . High risk medication use 08/28/2016  . Hypercalcemia   . Hypertensive heart disease without heart failure 08/28/2016  . Late effects of CVA  (cerebrovascular accident)   . Macular degeneration, dry   . Osteopenia   . Osteoporosis   . Other hyperlipidemia 08/28/2016  . Paroxysmal SVT (supraventricular tachycardia) ([place])   . Vitamin D deficiency     Current Outpatient Medications  Medication Sig Dispense Refill  . alendronate (FOSAMAX) 70 MG tablet Take 70 mg by mouth every Sunday. Take with a full glass of water on an empty stomach.    Marland Kitchen allopurinol (ZYLOPRIM) 300 MG tablet Take 300 mg by mouth at bedtime.    Marland Kitchen apixaban (ELIQUIS) 5 MG TABS tablet Take 5 mg by mouth 2 (two) times daily.    . Cholecalciferol (VITAMIN D3) 25 MCG (1000 UT) CAPS Take 1,000 Units by mouth daily.    . digoxin (LANOXIN) 0.125 MG tablet Take 0.0625 mg by mouth daily.    . furosemide (LASIX) 40 MG tablet Take 40 mg by mouth daily.    . metoprolol tartrate (LOPRESSOR) 50 MG tablet Take 50 mg by mouth 2 (two) times daily.    . mirtazapine (REMERON) 15 MG tablet Take 7.5 mg by mouth at bedtime.    . Multiple Vitamins-Minerals (PRESERVISION AREDS 2) CAPS Take 1 capsule by mouth 2 (two) times daily.    Marland Kitchen omega-3 acid ethyl esters (LOVAZA) 1 g capsule Take 2 g by mouth 2 (two) times daily.    Marland Kitchen omeprazole (PRILOSEC) 20 MG capsule Take 20 mg by  mouth daily as needed (Heartburn).    . potassium chloride SA (KLOR-CON) 20 MEQ tablet Take 20 mEq by mouth 2 (two) times daily.    . simvastatin (ZOCOR) 20 MG tablet Take 20 mg by mouth daily.    . Turmeric 500 MG TABS Take 500 mg by mouth daily.    Marland Kitchen acetaminophen (TYLENOL) 650 MG CR tablet Take 650 mg by mouth every 8 (eight) hours as needed for pain.    . Biotin 5 MG CAPS Take 5 mg by mouth daily.    . Multiple Vitamin (MULTIVITAMIN WITH MINERALS) TABS tablet Take 1 tablet by mouth daily.    Vladimir Faster Glycol-Propyl Glycol (SYSTANE OP) Place 1 drop into both eyes 2 (two) times daily.    Loura Pardon Salicylate (HM ARTHRITIS CREME EX) Apply 1 application topically daily as needed (pain).     No current  facility-administered medications for this visit.    ALLERGIES:   Propranolol, Sulfamethoxazole, Codeine, and Latex   SOCIAL HISTORY:  The patient  reports that she has never smoked. Her smokeless tobacco use includes snuff. She reports that she does not drink alcohol and does not use drugs.   FAMILY HISTORY:  The patient's family history includes Aneurysm in her maternal grandmother; Congestive Heart Failure in her father; Diabetes in her mother; Heart attack in her brother, father, and sister; Heart disease in her brother, father, maternal grandfather, and maternal grandmother; Hypertension in her brother and mother; Skin cancer in her brother; Stroke in her maternal grandfather and mother.   REVIEW OF SYSTEMS:  Negative except as outlined in the HPI  PHYSICAL EXAM: VS:  BP 112/80   Pulse 86   Ht 5\' 6"  (1.676 m)   Wt 123 lb (55.8 kg)   SpO2 96%   BMI 19.85 kg/m  , BMI Body mass index is 19.85 kg/m. GEN: Thin, elderly woman in no acute distress HEENT: normal Neck: No JVD. carotids 2+ without bruits or masses Cardiac: The heart is irregular with a 3/6 holosystolic murmur at the apex. No edema. Pedal pulses 2+ = bilaterally  Respiratory:  clear to auscultation bilaterally GI: soft, nontender, nondistended, + BS MS: no deformity or atrophy Skin: warm and dry, no rash Neuro:  Strength and sensation are intact Psych: euthymic mood, full affect  EKG:  EKG from 10/20/20 reviewed and demonstrates atrial fibrillation with occasional PVC, HR 83 bpm  RECENT LABS: 10/20/2020: BUN 22; Creatinine, Ser 1.14; Hemoglobin 14.4; Platelets 201; Potassium 4.2; Sodium 143  No results found for requested labs within last 8760 hours.   Estimated Creatinine Clearance: 33.5 mL/min (A) (by C-G formula based on SCr of 1.14 mg/dL (H)).   Wt Readings from Last 3 Encounters:  10/20/20 123 lb (55.8 kg)  09/30/20 125 lb 9.6 oz (57 kg)  09/14/20 125 lb (56.7 kg)     CARDIAC STUDIES:  TEE:  Study  Result    TRANSESOPHOGEAL ECHO REPORT       Patient Name:  Teresa Hughes Date of Exam: 09/14/2020  Medical Rec #: [id number]    Height:    66.0 in  Accession #:  [id number]    Weight:    125.0 lb  Date of Birth: [birth year]    BSA:     1.638 m  Patient Age:  83 years     BP:      142/82 mmHg  Patient Gender: F        HR:      64 bpm.  Exam Location: Inpatient   Procedure: Transesophageal Echo and 3D Echo   Indications:   Severe Mitral Regurgitation    History:     Patient has prior history of Echocardiogram examinations,  most          recent 07/13/2020. CHF, Stroke, Arrythmias:Atrial  Fibrillation;          Risk Factors:Dyslipidemia.    Sonographer:   Bernadene Person RDCS  Referring Phys: [id number] Richardo Priest  Diagnosing Phys: Buford Dresser MD   PROCEDURE: After discussion of the risks and benefits of a TEE, an  informed consent was obtained from the patient. The transesophogeal probe  was passed without difficulty through the esophogus of the patient.  Sedation performed by different physician.  The patient was monitored while under deep sedation. Anesthestetic  sedation was provided intravenously by Anesthesiology: 198.25mg  of  Propofol, 60mg  of Lidocaine. Image quality was good. The patient's vital  signs; including heart rate, blood pressure,  and oxygen saturation; remained stable throughout the procedure. The  patient developed no complications during the procedure.   IMPRESSIONS    1. Left ventricular ejection fraction, by estimation, is 50 to 55%. The  left ventricle has low normal function.  2. Right ventricular systolic function is normal. The right ventricular  size is normal.  3. No left atrial/left atrial appendage thrombus was detected.  4. A small pericardial effusion is present.  5. The mitral valve is abnormal. Severe mitral valve regurgitation. There   is severe holosystolic prolapse of the middle scallop of the posterior  leaflet of the mitral valve.  6. The aortic valve is tricuspid. Aortic valve regurgitation is mild. No  aortic stenosis is present.  7. There is Moderate (Grade III) plaque involving the descending aorta.   Conclusion(s)/Recommendation(s): There is a flail P2 leaflet with  holosystolic prolapse. MR jet wraps entire atrium in an eccentric manner,  consistent with severe MR. There is prolapse of a small chordal structure  and there is also an independently  mobile chord in the subvalvular apparatus. This may be cause of flail.      STS score Procedure: Isolated MVR Risk of Mortality: 7.586% Renal Failure: 1.501% Permanent Stroke: 3.273% Prolonged Ventilation: 16.594% DSW Infection: 0.030% Reoperation: 9.770% Morbidity or Mortality: 25.062% Short Length of Stay: 11.255% Long Length of Stay: 11.317%   Procedure: MV Repair Risk of Mortality: 8.195% Renal Failure: 1.733% Permanent Stroke: 7.409% Prolonged Ventilation: 12.163% DSW Infection: 0.015% Reoperation: 5.524% Morbidity or Mortality: 18.026% Short Length of Stay: 16.715% Long Length of Stay: 8.515%  ASSESSMENT AND PLAN: 28.  80 yo woman with longstanding persistent atrial fibrillation and severe, stage D, mitral regurgitation, associated with NYHA functional class III symptoms of acute on chronic combined systolic and diastolic heart failure. I have personally reviewed her  Echo and TEE studies. The patient may have mild LV dysfunction in the setting of an unloaded LV with severe MR. LVEF is estimated at 60-65% by echo and 50-55% by TEE. There is severe eccentric MR with a flail P2 scallop of the posterior mitral valve leaflet.   I have reviewed the natural history of mitral regurgitation with the patient and their family members who are present today. We have discussed the limitations of medical therapy and the poor prognosis  associated with symptomatic mitral regurgitation. We have also reviewed potential treatment options, including palliative medical therapy, conventional surgical mitral valve repair or replacement, and percutaneous mitral valve therapies such as edge-to-edge mitral valve approximation with MitraClip. We discussed  treatment options in the context of this patient's specific comorbid medical conditions.   We discussed further evaluation with R/L heart catheterization to evaluate hemodynamics of mitral regurgitation, evaluate for pulmonary HTN, and assess for obstructive CAD. I have reviewed the risks, indications, and alternatives to cardiac catheterization, possible angioplasty, and stenting with the patient. Risks include but are not limited to bleeding, infection, vascular injury, stroke, myocardial infection, arrhythmia, kidney injury, radiation-related injury in the case of prolonged fluoroscopy use, emergency cardiac surgery, and death. The patient understands the risks of serious complication is 1-2 in 2119 with diagnostic cardiac cath and 1-2% or less with angioplasty/stenting.   Following cardiac catheterization, the patient will be referred for formal cardiac surgical consultation as part of a multidisciplinary evaluation for severe primary mitral regurgitation. As part of today's discussion, I reviewed transcatheter edge to edge mitral valve repair with the patient.  Specific risks include vascular injury, bleeding, infection, arrhythmia, myocardial infarction, stroke, cardiac perforation, cardiac tamponade, device embolization, single leaflet detachment, endocarditis, mitral valve injury, emergency surgery, and death. They understand the risk of serious complication occurs at a rate of approximately 2%. She understands that further evaluation is needed to determine whether she is a candidate. After review of her TEE, her mitral valve anatomy appears favorable. With her history of stroke during  interruption of anticoagulation, she will require anticoagulation bridging or short interruption of anticoagulation for cardiac cath. Will discuss with Dr Bettina Gavia whether her eliquis dose should be reduced in light of her age and low body weight.   Deatra James 10/24/2020 10:12 PM     [place] [address]  [phone] (office) [phone] (fax)

## 2020-10-20 NOTE — Progress Notes (Addendum)
[facility]   MULTIDISCIPLINARY HEART VALVE TEAM  Date:  10/24/2020   ID:  Teresa Hughes, DOB Aug 04, 1941, MRN [id number]  PCP:  Nicoletta Dress, MD   Chief Complaint  Patient presents with  . Mitral Regurgitation     HISTORY OF PRESENT ILLNESS: Teresa Hughes is a 80 y.o. female who presents for evaluation of mitral regurgitation, referred by Dr Bettina Gavia.  She is here with daughter today. The patient is widowed since 2016. She has been living independently since that time. She is relatively active and still drives a car.   She has been dyspneic with activity for about 3 years. She notes that her dyspnea has worsened over the past year. She denies chest pain, pressure, orthopnea, PND, or leg swelling. She is now short of breath with very low level activity such as walking short distances on level ground. She has known of a heart murmur for over 10 years but was not told of this when she was much younger, I.e. during pregnancy. she has no hx of rheumatic fever.    The patient has not had regular dental care and reports most of her teeth have been out. She has 5 remaining lower teeth. She has avoided having them extracted because of her stroke history when her warfarin was discontinued in the past.   Past Medical History:  Diagnosis Date  . Acute kidney injury ([place])   . Anemia of chronic disease   . Aneurysm ([place]) 07/01/2018  . Anxiety   . Cerebral thrombosis with cerebral infarction ([place]) 05/07/2012  . CHF (congestive heart failure) ([place])   . Chronic anticoagulation 08/28/2016  . Chronic atrial fibrillation ([place]) 08/28/2016  . Chronic insomnia   . Chronic renal disease   . DDD (degenerative disc disease), cervical   . Esophageal stricture   . GERD (gastroesophageal reflux disease)   . Gout   . Hiatal hernia   . High risk medication use 08/28/2016  . Hypercalcemia   . Hypertensive heart disease without heart failure 08/28/2016  . Late effects of CVA  (cerebrovascular accident)   . Macular degeneration, dry   . Osteopenia   . Osteoporosis   . Other hyperlipidemia 08/28/2016  . Paroxysmal SVT (supraventricular tachycardia) ([place])   . Vitamin D deficiency     Current Outpatient Medications  Medication Sig Dispense Refill  . alendronate (FOSAMAX) 70 MG tablet Take 70 mg by mouth every Sunday. Take with a full glass of water on an empty stomach.    Marland Kitchen allopurinol (ZYLOPRIM) 300 MG tablet Take 300 mg by mouth at bedtime.    Marland Kitchen apixaban (ELIQUIS) 5 MG TABS tablet Take 5 mg by mouth 2 (two) times daily.    . Cholecalciferol (VITAMIN D3) 25 MCG (1000 UT) CAPS Take 1,000 Units by mouth daily.    . digoxin (LANOXIN) 0.125 MG tablet Take 0.0625 mg by mouth daily.    . furosemide (LASIX) 40 MG tablet Take 40 mg by mouth daily.    . metoprolol tartrate (LOPRESSOR) 50 MG tablet Take 50 mg by mouth 2 (two) times daily.    . mirtazapine (REMERON) 15 MG tablet Take 7.5 mg by mouth at bedtime.    . Multiple Vitamins-Minerals (PRESERVISION AREDS 2) CAPS Take 1 capsule by mouth 2 (two) times daily.    Marland Kitchen omega-3 acid ethyl esters (LOVAZA) 1 g capsule Take 2 g by mouth 2 (two) times daily.    Marland Kitchen omeprazole (PRILOSEC) 20 MG capsule Take 20 mg by  mouth daily as needed (Heartburn).    . potassium chloride SA (KLOR-CON) 20 MEQ tablet Take 20 mEq by mouth 2 (two) times daily.    . simvastatin (ZOCOR) 20 MG tablet Take 20 mg by mouth daily.    . Turmeric 500 MG TABS Take 500 mg by mouth daily.    Marland Kitchen acetaminophen (TYLENOL) 650 MG CR tablet Take 650 mg by mouth every 8 (eight) hours as needed for pain.    . Biotin 5 MG CAPS Take 5 mg by mouth daily.    . Multiple Vitamin (MULTIVITAMIN WITH MINERALS) TABS tablet Take 1 tablet by mouth daily.    Vladimir Faster Glycol-Propyl Glycol (SYSTANE OP) Place 1 drop into both eyes 2 (two) times daily.    Loura Pardon Salicylate (HM ARTHRITIS CREME EX) Apply 1 application topically daily as needed (pain).     No current  facility-administered medications for this visit.    ALLERGIES:   Propranolol, Sulfamethoxazole, Codeine, and Latex   SOCIAL HISTORY:  The patient  reports that she has never smoked. Her smokeless tobacco use includes snuff. She reports that she does not drink alcohol and does not use drugs.   FAMILY HISTORY:  The patient's family history includes Aneurysm in her maternal grandmother; Congestive Heart Failure in her father; Diabetes in her mother; Heart attack in her brother, father, and sister; Heart disease in her brother, father, maternal grandfather, and maternal grandmother; Hypertension in her brother and mother; Skin cancer in her brother; Stroke in her maternal grandfather and mother.   REVIEW OF SYSTEMS:  Negative except as outlined in the HPI  PHYSICAL EXAM: VS:  BP 112/80   Pulse 86   Ht 5\' 6"  (1.676 m)   Wt 123 lb (55.8 kg)   SpO2 96%   BMI 19.85 kg/m  , BMI Body mass index is 19.85 kg/m. GEN: Thin, elderly woman in no acute distress HEENT: normal Neck: No JVD. carotids 2+ without bruits or masses Cardiac: The heart is irregular with a 3/6 holosystolic murmur at the apex. No edema. Pedal pulses 2+ = bilaterally  Respiratory:  clear to auscultation bilaterally GI: soft, nontender, nondistended, + BS MS: no deformity or atrophy Skin: warm and dry, no rash Neuro:  Strength and sensation are intact Psych: euthymic mood, full affect  EKG:  EKG from 10/20/20 reviewed and demonstrates atrial fibrillation with occasional PVC, HR 83 bpm  RECENT LABS: 10/20/2020: BUN 22; Creatinine, Ser 1.14; Hemoglobin 14.4; Platelets 201; Potassium 4.2; Sodium 143  No results found for requested labs within last 8760 hours.   Estimated Creatinine Clearance: 33.5 mL/min (A) (by C-G formula based on SCr of 1.14 mg/dL (H)).   Wt Readings from Last 3 Encounters:  10/20/20 123 lb (55.8 kg)  09/30/20 125 lb 9.6 oz (57 kg)  09/14/20 125 lb (56.7 kg)     CARDIAC STUDIES:  TEE:  Study  Result    TRANSESOPHOGEAL ECHO REPORT       Patient Name:  Teresa Hughes Date of Exam: 09/14/2020  Medical Rec #: [id number]    Height:    66.0 in  Accession #:  [id number]    Weight:    125.0 lb  Date of Birth: 11/02/1940    BSA:     1.638 m  Patient Age:  10 years     BP:      142/82 mmHg  Patient Gender: F        HR:      64 bpm.  Exam Location: Inpatient   Procedure: Transesophageal Echo and 3D Echo   Indications:   Severe Mitral Regurgitation    History:     Patient has prior history of Echocardiogram examinations,  most          recent 07/13/2020. CHF, Stroke, Arrythmias:Atrial  Fibrillation;          Risk Factors:Dyslipidemia.    Sonographer:   Bernadene Person RDCS  Referring Phys: [id number] Richardo Priest  Diagnosing Phys: Buford Dresser MD   PROCEDURE: After discussion of the risks and benefits of a TEE, an  informed consent was obtained from the patient. The transesophogeal probe  was passed without difficulty through the esophogus of the patient.  Sedation performed by different physician.  The patient was monitored while under deep sedation. Anesthestetic  sedation was provided intravenously by Anesthesiology: 198.25mg  of  Propofol, 60mg  of Lidocaine. Image quality was good. The patient's vital  signs; including heart rate, blood pressure,  and oxygen saturation; remained stable throughout the procedure. The  patient developed no complications during the procedure.   IMPRESSIONS    1. Left ventricular ejection fraction, by estimation, is 50 to 55%. The  left ventricle has low normal function.  2. Right ventricular systolic function is normal. The right ventricular  size is normal.  3. No left atrial/left atrial appendage thrombus was detected.  4. A small pericardial effusion is present.  5. The mitral valve is abnormal. Severe mitral valve regurgitation. There   is severe holosystolic prolapse of the middle scallop of the posterior  leaflet of the mitral valve.  6. The aortic valve is tricuspid. Aortic valve regurgitation is mild. No  aortic stenosis is present.  7. There is Moderate (Grade III) plaque involving the descending aorta.   Conclusion(s)/Recommendation(s): There is a flail P2 leaflet with  holosystolic prolapse. MR jet wraps entire atrium in an eccentric manner,  consistent with severe MR. There is prolapse of a small chordal structure  and there is also an independently  mobile chord in the subvalvular apparatus. This may be cause of flail.      STS score Procedure: Isolated MVR Risk of Mortality: 7.586% Renal Failure: 1.501% Permanent Stroke: 3.273% Prolonged Ventilation: 16.594% DSW Infection: 0.030% Reoperation: 9.770% Morbidity or Mortality: 25.062% Short Length of Stay: 11.255% Long Length of Stay: 11.317%   Procedure: MV Repair Risk of Mortality: 8.195% Renal Failure: 1.733% Permanent Stroke: 7.409% Prolonged Ventilation: 12.163% DSW Infection: 0.015% Reoperation: 5.524% Morbidity or Mortality: 18.026% Short Length of Stay: 16.715% Long Length of Stay: 8.515%  ASSESSMENT AND PLAN: 42.  80 yo woman with longstanding persistent atrial fibrillation and severe, stage D, mitral regurgitation, associated with NYHA functional class III symptoms of acute on chronic combined systolic and diastolic heart failure. I have personally reviewed her  Echo and TEE studies. The patient may have mild LV dysfunction in the setting of an unloaded LV with severe MR. LVEF is estimated at 60-65% by echo and 50-55% by TEE. There is severe eccentric MR with a flail P2 scallop of the posterior mitral valve leaflet.   I have reviewed the natural history of mitral regurgitation with the patient and their family members who are present today. We have discussed the limitations of medical therapy and the poor prognosis  associated with symptomatic mitral regurgitation. We have also reviewed potential treatment options, including palliative medical therapy, conventional surgical mitral valve repair or replacement, and percutaneous mitral valve therapies such as edge-to-edge mitral valve approximation with MitraClip. We discussed  treatment options in the context of this patient's specific comorbid medical conditions.   We discussed further evaluation with R/L heart catheterization to evaluate hemodynamics of mitral regurgitation, evaluate for pulmonary HTN, and assess for obstructive CAD. I have reviewed the risks, indications, and alternatives to cardiac catheterization, possible angioplasty, and stenting with the patient. Risks include but are not limited to bleeding, infection, vascular injury, stroke, myocardial infection, arrhythmia, kidney injury, radiation-related injury in the case of prolonged fluoroscopy use, emergency cardiac surgery, and death. The patient understands the risks of serious complication is 1-2 in 1856 with diagnostic cardiac cath and 1-2% or less with angioplasty/stenting.   Following cardiac catheterization, the patient will be referred for formal cardiac surgical consultation as part of a multidisciplinary evaluation for severe primary mitral regurgitation. As part of today's discussion, I reviewed transcatheter edge to edge mitral valve repair with the patient.  Specific risks include vascular injury, bleeding, infection, arrhythmia, myocardial infarction, stroke, cardiac perforation, cardiac tamponade, device embolization, single leaflet detachment, endocarditis, mitral valve injury, emergency surgery, and death. They understand the risk of serious complication occurs at a rate of approximately 2%. She understands that further evaluation is needed to determine whether she is a candidate. After review of her TEE, her mitral valve anatomy appears favorable. With her history of stroke during  interruption of anticoagulation, she will require anticoagulation bridging or short interruption of anticoagulation for cardiac cath. Will discuss with Dr Bettina Gavia whether her eliquis dose should be reduced in light of her age and low body weight.   Deatra James 10/24/2020 10:12 PM     [place] [address]  [phone] (office) [phone] (fax)

## 2020-10-21 LAB — CBC WITH DIFFERENTIAL/PLATELET
Basophils Absolute: 0.1 10*3/uL (ref 0.0–0.2)
Basos: 1 %
EOS (ABSOLUTE): 0.1 10*3/uL (ref 0.0–0.4)
Eos: 2 %
Hematocrit: 41.2 % (ref 34.0–46.6)
Hemoglobin: 14.4 g/dL (ref 11.1–15.9)
Immature Grans (Abs): 0.1 10*3/uL (ref 0.0–0.1)
Immature Granulocytes: 1 %
Lymphocytes Absolute: 2.4 10*3/uL (ref 0.7–3.1)
Lymphs: 33 %
MCH: 32 pg (ref 26.6–33.0)
MCHC: 35 g/dL (ref 31.5–35.7)
MCV: 92 fL (ref 79–97)
Monocytes Absolute: 0.8 10*3/uL (ref 0.1–0.9)
Monocytes: 11 %
Neutrophils Absolute: 3.9 10*3/uL (ref 1.4–7.0)
Neutrophils: 52 %
Platelets: 201 10*3/uL (ref 150–450)
RBC: 4.5 x10E6/uL (ref 3.77–5.28)
RDW: 13.5 % (ref 11.7–15.4)
WBC: 7.4 10*3/uL (ref 3.4–10.8)

## 2020-10-21 LAB — BASIC METABOLIC PANEL
BUN/Creatinine Ratio: 19 (ref 12–28)
BUN: 22 mg/dL (ref 8–27)
CO2: 25 mmol/L (ref 20–29)
Calcium: 9.2 mg/dL (ref 8.7–10.3)
Chloride: 103 mmol/L (ref 96–106)
Creatinine, Ser: 1.14 mg/dL — ABNORMAL HIGH (ref 0.57–1.00)
Glucose: 96 mg/dL (ref 65–99)
Potassium: 4.2 mmol/L (ref 3.5–5.2)
Sodium: 143 mmol/L (ref 134–144)
eGFR: 48 mL/min/{1.73_m2} — ABNORMAL LOW (ref >59)

## 2020-10-24 ENCOUNTER — Encounter: Payer: Self-pay | Admitting: Cardiovascular Disease

## 2020-10-27 ENCOUNTER — Other Ambulatory Visit (HOSPITAL_COMMUNITY)
Admission: RE | Admit: 2020-10-27 | Discharge: 2020-10-27 | Disposition: A | Discharge status: Home / Self Care | Payer: Medicare HMO | Source: Ambulatory Visit | Attending: Cardiovascular Disease | Admitting: Cardiovascular Disease

## 2020-10-27 DIAGNOSIS — Z20822 Contact with and (suspected) exposure to covid-19: Secondary | ICD-10-CM | POA: Diagnosis not present

## 2020-10-27 DIAGNOSIS — Z01812 Encounter for preprocedural laboratory examination: Secondary | ICD-10-CM | POA: Diagnosis not present

## 2020-10-27 LAB — SARS CORONAVIRUS 2 (TAT 6-24 HRS): SARS Coronavirus 2: NEGATIVE

## 2020-10-28 ENCOUNTER — Telehealth: Payer: Self-pay | Admitting: *Deleted

## 2020-10-28 NOTE — Telephone Encounter (Signed)
Pt contacted pre-catheterization scheduled at [facility] for: Friday Friday October 29, 2020 7:30 AM Verified arrival time and place: [facility]) at: 5:30 AM  No solid food after midnight prior to cath, clear liquids until 5 AM day of procedure.  Hold: Eliquis-PM prior and AM of procedure per Dr Burt Knack Lasix/KCl-day before and day of procedure-GFR 76  Except hold medications AM meds can be  taken pre-cath with sips of water including: ASA 81 mg-she knows it can be given to her at the hospital AM of cath and she may not continue this post cath.   Confirmed patient has responsible adult to drive home post procedure and be with patient first 24 hours after arriving home: yes  You are allowed ONE visitor in the waiting room during the time you are at the hospital for your procedure. Both you and your visitor must wear a mask once you enter the hospital.   Reviewed procedure/mask/visitor instructions with patient.

## 2020-10-29 ENCOUNTER — Encounter (HOSPITAL_COMMUNITY)
Admission: RE | Disposition: A | Discharge status: Home / Self Care | Payer: Self-pay | Source: Home / Self Care | Attending: Cardiovascular Disease

## 2020-10-29 ENCOUNTER — Ambulatory Visit (HOSPITAL_COMMUNITY)
Admission: RE | Admit: 2020-10-29 | Discharge: 2020-10-29 | Disposition: A | Discharge status: Home / Self Care | Payer: Medicare HMO | Attending: Cardiovascular Disease | Admitting: Cardiovascular Disease

## 2020-10-29 ENCOUNTER — Encounter (HOSPITAL_COMMUNITY): Payer: Self-pay | Admitting: Cardiovascular Disease

## 2020-10-29 DIAGNOSIS — I5043 Acute on chronic combined systolic (congestive) and diastolic (congestive) heart failure: Secondary | ICD-10-CM | POA: Insufficient documentation

## 2020-10-29 DIAGNOSIS — I4811 Longstanding persistent atrial fibrillation: Secondary | ICD-10-CM | POA: Insufficient documentation

## 2020-10-29 DIAGNOSIS — I130 Hypertensive heart and chronic kidney disease with heart failure and stage 1 through stage 4 chronic kidney disease, or unspecified chronic kidney disease: Secondary | ICD-10-CM | POA: Diagnosis not present

## 2020-10-29 DIAGNOSIS — Z882 Allergy status to sulfonamides status: Secondary | ICD-10-CM | POA: Insufficient documentation

## 2020-10-29 DIAGNOSIS — I340 Nonrheumatic mitral (valve) insufficiency: Secondary | ICD-10-CM | POA: Diagnosis not present

## 2020-10-29 DIAGNOSIS — N189 Chronic kidney disease, unspecified: Secondary | ICD-10-CM | POA: Insufficient documentation

## 2020-10-29 DIAGNOSIS — Z8249 Family history of ischemic heart disease and other diseases of the circulatory system: Secondary | ICD-10-CM | POA: Diagnosis not present

## 2020-10-29 DIAGNOSIS — I2510 Atherosclerotic heart disease of native coronary artery without angina pectoris: Secondary | ICD-10-CM | POA: Diagnosis not present

## 2020-10-29 DIAGNOSIS — Z885 Allergy status to narcotic agent status: Secondary | ICD-10-CM | POA: Diagnosis not present

## 2020-10-29 DIAGNOSIS — Z79899 Other long term (current) drug therapy: Secondary | ICD-10-CM | POA: Diagnosis not present

## 2020-10-29 DIAGNOSIS — Z8673 Personal history of transient ischemic attack (TIA), and cerebral infarction without residual deficits: Secondary | ICD-10-CM | POA: Insufficient documentation

## 2020-10-29 DIAGNOSIS — I272 Pulmonary hypertension, unspecified: Secondary | ICD-10-CM | POA: Diagnosis not present

## 2020-10-29 HISTORY — PX: RIGHT/LEFT HEART CATH AND CORONARY ANGIOGRAPHY: CATH118266

## 2020-10-29 LAB — POCT I-STAT 7, (LYTES, BLD GAS, ICA,H+H)
Acid-base deficit: 1 mmol/L (ref 0.0–2.0)
Bicarbonate: 25.3 mmol/L (ref 20.0–28.0)
Calcium, Ion: 1.25 mmol/L (ref 1.15–1.40)
HCT: 36 % (ref 36.0–46.0)
Hemoglobin: 12.2 g/dL (ref 12.0–15.0)
O2 Saturation: 97 %
Potassium: 3.5 mmol/L (ref 3.5–5.1)
Sodium: 143 mmol/L (ref 135–145)
TCO2: 27 mmol/L (ref 22–32)
pCO2 arterial: 45.9 mmHg (ref 32.0–48.0)
pH, Arterial: 7.348 — ABNORMAL LOW (ref 7.350–7.450)
pO2, Arterial: 97 mmHg (ref 83.0–108.0)

## 2020-10-29 LAB — POCT I-STAT EG7
Acid-Base Excess: 2 mmol/L (ref 0.0–2.0)
Bicarbonate: 28.3 mmol/L — ABNORMAL HIGH (ref 20.0–28.0)
Calcium, Ion: 1.13 mmol/L — ABNORMAL LOW (ref 1.15–1.40)
HCT: 33 % — ABNORMAL LOW (ref 36.0–46.0)
Hemoglobin: 11.2 g/dL — ABNORMAL LOW (ref 12.0–15.0)
O2 Saturation: 70 %
Potassium: 3.3 mmol/L — ABNORMAL LOW (ref 3.5–5.1)
Sodium: 146 mmol/L — ABNORMAL HIGH (ref 135–145)
TCO2: 30 mmol/L (ref 22–32)
pCO2, Ven: 48.9 mmHg (ref 44.0–60.0)
pH, Ven: 7.37 (ref 7.250–7.430)
pO2, Ven: 38 mmHg (ref 32.0–45.0)

## 2020-10-29 SURGERY — RIGHT/LEFT HEART CATH AND CORONARY ANGIOGRAPHY
Anesthesia: LOCAL

## 2020-10-29 MED ORDER — HEPARIN (PORCINE) IN NACL 1000-0.9 UT/500ML-% IV SOLN
INTRAVENOUS | Status: AC
  Filled 2020-10-29: qty 1000

## 2020-10-29 MED ORDER — ASPIRIN 81 MG PO CHEW
81.0000 mg | CHEWABLE_TABLET | ORAL | Status: AC
  Administered 2020-10-29: 81 mg via ORAL
  Filled 2020-10-29: qty 1

## 2020-10-29 MED ORDER — SODIUM CHLORIDE 0.9% FLUSH: 3.0000 mL | Freq: Two times a day (BID) | INTRAVENOUS | Status: DC

## 2020-10-29 MED ORDER — MIDAZOLAM HCL 2 MG/2ML IJ SOLN
INTRAMUSCULAR | Status: DC | PRN
  Administered 2020-10-29: 1 mg via INTRAVENOUS

## 2020-10-29 MED ORDER — SODIUM CHLORIDE 0.9 % WEIGHT BASED INFUSION: 1.0000 mL/kg/h | INTRAVENOUS | Status: DC

## 2020-10-29 MED ORDER — APIXABAN 2.5 MG PO TABS: 2.5000 mg | ORAL_TABLET | Freq: Two times a day (BID) | ORAL | 11 refills | Status: AC

## 2020-10-29 MED ORDER — LIDOCAINE HCL (PF) 1 % IJ SOLN
INTRAMUSCULAR | Status: DC | PRN
  Administered 2020-10-29 (×2): 2 mL

## 2020-10-29 MED ORDER — SODIUM CHLORIDE 0.9% FLUSH: 3.0000 mL | INTRAVENOUS | Status: DC | PRN

## 2020-10-29 MED ORDER — MIDAZOLAM HCL 2 MG/2ML IJ SOLN
INTRAMUSCULAR | Status: AC
  Filled 2020-10-29: qty 2

## 2020-10-29 MED ORDER — ACETAMINOPHEN 325 MG PO TABS: 650.0000 mg | ORAL_TABLET | ORAL | Status: DC | PRN

## 2020-10-29 MED ORDER — LIDOCAINE HCL (PF) 1 % IJ SOLN
INTRAMUSCULAR | Status: AC
  Filled 2020-10-29: qty 30

## 2020-10-29 MED ORDER — HEPARIN SODIUM (PORCINE) 1000 UNIT/ML IJ SOLN
INTRAMUSCULAR | Status: DC | PRN
  Administered 2020-10-29: 3000 [IU] via INTRAVENOUS

## 2020-10-29 MED ORDER — SODIUM CHLORIDE 0.9 % IV SOLN: 250.0000 mL | INTRAVENOUS | Status: DC | PRN

## 2020-10-29 MED ORDER — HEPARIN (PORCINE) IN NACL 1000-0.9 UT/500ML-% IV SOLN
INTRAVENOUS | Status: DC | PRN
  Administered 2020-10-29 (×2): 500 mL

## 2020-10-29 MED ORDER — IOHEXOL 350 MG/ML SOLN
INTRAVENOUS | Status: DC | PRN
  Administered 2020-10-29: 50 mL

## 2020-10-29 MED ORDER — HYDRALAZINE HCL 20 MG/ML IJ SOLN: 10.0000 mg | INTRAMUSCULAR | Status: DC | PRN

## 2020-10-29 MED ORDER — ONDANSETRON HCL 4 MG/2ML IJ SOLN: 4.0000 mg | Freq: Four times a day (QID) | INTRAMUSCULAR | Status: DC | PRN

## 2020-10-29 MED ORDER — SODIUM CHLORIDE 0.9% FLUSH
3.0000 mL | INTRAVENOUS | Status: DC | PRN
Start: 2020-10-29 — End: 2020-10-29

## 2020-10-29 MED ORDER — SODIUM CHLORIDE 0.9 % IV SOLN
250.0000 mL | INTRAVENOUS | Status: DC | PRN
Start: 2020-10-29 — End: 2020-10-29

## 2020-10-29 MED ORDER — SODIUM CHLORIDE 0.9 % WEIGHT BASED INFUSION
3.0000 mL/kg/h | INTRAVENOUS | Status: AC
  Administered 2020-10-29: 3 mL/kg/h via INTRAVENOUS

## 2020-10-29 MED ORDER — VERAPAMIL HCL 2.5 MG/ML IV SOLN
INTRAVENOUS | Status: AC
  Filled 2020-10-29: qty 2

## 2020-10-29 MED ORDER — HEPARIN SODIUM (PORCINE) 1000 UNIT/ML IJ SOLN
INTRAMUSCULAR | Status: AC
  Filled 2020-10-29: qty 1

## 2020-10-29 MED ORDER — FENTANYL CITRATE (PF) 100 MCG/2ML IJ SOLN
INTRAMUSCULAR | Status: DC | PRN
  Administered 2020-10-29: 25 ug via INTRAVENOUS

## 2020-10-29 MED ORDER — FENTANYL CITRATE (PF) 100 MCG/2ML IJ SOLN
INTRAMUSCULAR | Status: AC
  Filled 2020-10-29: qty 2

## 2020-10-29 MED ORDER — VERAPAMIL HCL 2.5 MG/ML IV SOLN
INTRAVENOUS | Status: DC | PRN
  Administered 2020-10-29: 10 mL via INTRA_ARTERIAL

## 2020-10-29 SURGICAL SUPPLY — 12 items
CATH 5FR JL3.5 JR4 ANG PIG MP (CATHETERS) ×2 IMPLANT
CATH BALLN WEDGE 5F 110CM (CATHETERS) ×2 IMPLANT
DEVICE RAD TR BAND REGULAR (VASCULAR PRODUCTS) ×2 IMPLANT
GLIDESHEATH SLEND SS 6F .021 (SHEATH) ×2 IMPLANT
GUIDEWIRE INQWIRE 1.5J.035X260 (WIRE) ×1 IMPLANT
INQWIRE 1.5J .035X260CM (WIRE) ×2
KIT HEART LEFT (KITS) ×2 IMPLANT
PACK CARDIAC CATHETERIZATION (CUSTOM PROCEDURE TRAY) ×2 IMPLANT
SHEATH GLIDE SLENDER 4/5FR (SHEATH) ×2 IMPLANT
TRANSDUCER W/STOPCOCK (MISCELLANEOUS) ×2 IMPLANT
TUBING CIL FLEX 10 FLL-RA (TUBING) ×2 IMPLANT
WIRE HI TORQ VERSACORE-J 145CM (WIRE) ×2 IMPLANT

## 2020-10-29 NOTE — Progress Notes (Signed)
TR Band removed at 10:15- no bleeding noted on removal or when bandage placed. Patient has been sleepy- but easily arousable since return to short stay. At 10:25- RN arrived to help patient get dressed and found that site was bleeding. Pressure immediately held- other nurse to assist- patient cleaned up. After 20 min- pressure relieved- but hematoma began to form- bleeding had stopped. Pressure reheld for another 10 min and RN remained in room to reevaluate. Daughter contacted to let her know of delay. PA Reino Bellis made aware and will return if needed to evalute. At 10:55 bleeding has stopped- new bandage placed. RN will continue to monitor for any signs of bleeding/hematoma. New discharge time in 1 hour.

## 2020-10-29 NOTE — Discharge Instructions (Signed)
Radial Site Care  This sheet gives you information about how to care for yourself after your procedure. Your health care provider may also give you more specific instructions. If you have problems or questions, contact your health care provider. What can I expect after the procedure? After the procedure, it is common to have:  Bruising and tenderness at the catheter insertion area. Follow these instructions at home: Medicines  Take over-the-counter and prescription medicines only as told by your health care provider. Insertion site care  Follow instructions from your health care provider about how to take care of your insertion site. Make sure you: ? Wash your hands with soap and water before you change your bandage (dressing). If soap and water are not available, use hand sanitizer. ? Change your dressing as told by your health care provider. ? Leave stitches (sutures), skin glue, or adhesive strips in place. These skin closures may need to stay in place for 2 weeks or longer. If adhesive strip edges start to loosen and curl up, you may trim the loose edges. Do not remove adhesive strips completely unless your health care provider tells you to do that.  Check your insertion site every day for signs of infection. Check for: ? Redness, swelling, or pain. ? Fluid or blood. ? Pus or a bad smell. ? Warmth.  Do not take baths, swim, or use a hot tub until your health care provider approves.  You may shower 24-48 hours after the procedure, or as directed by your health care provider. ? Remove the dressing and gently wash the site with plain soap and water. ? Pat the area dry with a clean towel. ? Do not rub the site. That could cause bleeding.  Do not apply powder or lotion to the site. Activity  For 24 hours after the procedure, or as directed by your health care provider: ? Do not flex or bend the affected arm. ? Do not push or pull heavy objects with the affected arm. ? Do not drive  yourself home from the hospital or clinic. You may drive 24 hours after the procedure unless your health care provider tells you not to. ? Do not operate machinery or power tools.  Do not lift anything that is heavier than 10 lb (4.5 kg), or the limit that you are told, until your health care provider says that it is safe.  Ask your health care provider when it is okay to: ? Return to work or school. ? Resume usual physical activities or sports. ? Resume sexual activity.   General instructions  If the catheter site starts to bleed, raise your arm and put firm pressure on the site. If the bleeding does not stop, get help right away. This is a medical emergency.  If you went home on the same day as your procedure, a responsible adult should be with you for the first 24 hours after you arrive home.  Keep all follow-up visits as told by your health care provider. This is important. Contact a health care provider if:  You have a fever.  You have redness, swelling, or yellow drainage around your insertion site. Get help right away if:  You have unusual pain at the radial site.  The catheter insertion area swells very fast.  The insertion area is bleeding, and the bleeding does not stop when you hold steady pressure on the area.  Your arm or hand becomes pale, cool, tingly, or numb. These symptoms may represent a serious   problem that is an emergency. Do not wait to see if the symptoms will go away. Get medical help right away. Call your local emergency services (911 in the [place] Do not drive yourself to the hospital. Summary  After the procedure, it is common to have bruising and tenderness at the site.  Follow instructions from your health care provider about how to take care of your radial site wound. Check the wound every day for signs of infection.  Do not lift anything that is heavier than 10 lb (4.5 kg), or the limit that you are told, until your health care provider says that it  is safe. This information is not intended to replace advice given to you by your health care provider. Make sure you discuss any questions you have with your health care provider. Document Revised: 09/05/2017 Document Reviewed: 09/05/2017 [employer]vier Patient Education  2021 [employer]  

## 2020-10-29 NOTE — Interval H&P Note (Signed)
History and Physical Interval Note:  10/29/2020 7:43 AM  Teresa Hughes  has presented today for surgery, with the diagnosis of MR.  The various methods of treatment have been discussed with the patient and family. After consideration of risks, benefits and other options for treatment, the patient has consented to  Procedure(s): RIGHT/LEFT HEART CATH AND CORONARY ANGIOGRAPHY (N/A) as a surgical intervention.  The patient's history has been reviewed, patient examined, no change in status, stable for surgery.  I have reviewed the patient's chart and labs.  Questions were answered to the patient's satisfaction.     [name]

## 2020-11-08 ENCOUNTER — Other Ambulatory Visit: Payer: Self-pay

## 2020-11-08 ENCOUNTER — Institutional Professional Consult (permissible substitution): Payer: Medicare HMO | Admitting: Thoracic Surgery (Cardiothoracic Vascular Surgery)

## 2020-11-08 ENCOUNTER — Encounter: Payer: Self-pay | Admitting: Thoracic Surgery (Cardiothoracic Vascular Surgery)

## 2020-11-08 VITALS — BP 162/109 | HR 80 | Resp 20 | Ht 66.0 in | Wt 125.0 lb

## 2020-11-08 DIAGNOSIS — I340 Nonrheumatic mitral (valve) insufficiency: Secondary | ICD-10-CM | POA: Diagnosis not present

## 2020-11-08 DIAGNOSIS — I482 Chronic atrial fibrillation, unspecified: Secondary | ICD-10-CM | POA: Diagnosis not present

## 2020-11-08 DIAGNOSIS — I2510 Atherosclerotic heart disease of native coronary artery without angina pectoris: Secondary | ICD-10-CM | POA: Insufficient documentation

## 2020-11-08 NOTE — Progress Notes (Signed)
[facility]  MULTIDISCIPLINARY HEART VALVE CLINIC  CARDIOTHORACIC SURGERY CONSULTATION REPORT  Referring Provider is Richardo Priest, MD PCP is Nicoletta Dress, MD  Chief Complaint  Patient presents with  . Mitral Regurgitation    Initial surgical consult ECHO 07/13/20, TEE 2/1, Cath 3/18    HPI:  Patient is an 80 year old female with history of mitral valve prolapse with mitral regurgitation, severe multivessel coronary artery disease, longstanding persistent atrial fibrillation on long-term anticoagulation, embolic stroke, chronic diastolic congestive heart failure, chronic kidney disease, degenerative arthritis with chronic back pain, esophageal stricture, GE reflux disease, macular degeneration, and hiatal hernia who has been referred for surgical consultation to discuss treatment options for management of mitral regurgitation and multivessel coronary artery disease.  Patient states that she is been known of presence of a heart murmur since she was young.  She denies any previous history of coronary artery disease.  She has history of previous stroke with some residual and history of longstanding persistent atrial fibrillation currently anticoagulated using Eliquis.  She has long history of shortness of breath without chest discomfort dating back at least 5 or 6 years but which has progressed substantially over the past 6 to 12 months.  Follow-up transthoracic echocardiogram performed July 13, 2020 revealed normal left ventricular systolic function with ejection fraction estimated 60 to 65%.  There was mitral valve prolapse with moderate to severe mitral regurgitation.  Transesophageal echocardiogram was recommended.  Patient underwent TEE on September 14, 2020 which confirmed the presence of mitral valve prolapse with severe mitral regurgitation.  There was low normal left ventricular function with ejection fraction estimated only 50 to 55% in the setting of severe mitral  regurgitation.  There was severe holosystolic prolapse involving a portion of the middle scallop of the posterior leaflet.  There was severe left atrial enlargement.  The patient was referred to the multidisciplinary heart valve clinic and has been evaluated previously by Dr. Burt Knack who discussed the potential role of transcatheter edge-to-edge repair.  Diagnostic cardiac catheterization was performed October 29, 2020.  Catheterization revealed severe multivessel coronary artery disease including long segment severe stenosis of the mid right coronary artery and 75% proximal stenosis of the left anterior descending coronary artery with mild nonobstructive disease in the left circumflex territory.  Right heart pressures were very mildly elevated.  The patient's multiple diagnostic tests were reviewed by multidisciplinary team of specialists and the patient was referred for surgical consultation.  Patient is widowed and lives alone in [place].  She is accompanied by her daughter for her office consultation visit today.  She has been retired for many years having previously worked in the [employer].  She remains functionally independent and drives a car.  She tends to ordinary chores within her house but does not take care of anything in her yard.  She complains that she has been slowing down for a long time with a gradual progression of symptoms of exertional shortness of breath and fatigue.  Symptoms have gotten much worse over the past year.   She denies any history of PND, orthopnea, or lower extremity edema.  She has not had any palpitations, dizzy spells, or syncope.  She also has problems with chronic pain in her back.  She states that she gets short of breath with mild to moderate activity level but she is comfortable with low-level activity.  She has not had any chest pain or chest tightness either with activity or at rest.  She  complains that she never completely recovered from her  stroke and she still struggles with problems with memory and generalized weakness.  She states that her balance is a little bit off but she has not had any mechanical falls.    Past Medical History:  Diagnosis Date  . Acute kidney injury ([place])   . Anemia of chronic disease   . Aneurysm ([place]) 07/01/2018  . Anxiety   . Cerebral thrombosis with cerebral infarction ([place]) 05/07/2012  . CHF (congestive heart failure) ([place])   . Chronic anticoagulation 08/28/2016  . Chronic atrial fibrillation ([place]) 08/28/2016  . Chronic insomnia   . Chronic renal disease   . DDD (degenerative disc disease), cervical   . Esophageal stricture   . GERD (gastroesophageal reflux disease)   . Gout   . Hiatal hernia   . High risk medication use 08/28/2016  . Hypercalcemia   . Hypertensive heart disease without heart failure 08/28/2016  . Late effects of CVA (cerebrovascular accident)   . Macular degeneration, dry   . Osteopenia   . Osteoporosis   . Other hyperlipidemia 08/28/2016  . Paroxysmal SVT (supraventricular tachycardia) ([place])   . Vitamin D deficiency     Past Surgical History:  Procedure Laterality Date  . CATARACT EXTRACTION Bilateral    Right 04/02/17, Left 9/22018  . ERCP W/ SPHINCTEROTOMY AND BALLOON DILATION    . HEMORRHOID SURGERY  2016  . RIGHT/LEFT HEART CATH AND CORONARY ANGIOGRAPHY N/A 10/29/2020   Procedure: RIGHT/LEFT HEART CATH AND CORONARY ANGIOGRAPHY;  Surgeon: [name], MD;  Location: [facility];  Service: Cardiovascular;  Laterality: N/A;  . TEE WITHOUT CARDIOVERSION N/A 09/14/2020   Procedure: TRANSESOPHAGEAL ECHOCARDIOGRAM (TEE);  Surgeon: Buford Dresser, MD;  Location[facility] END[facility]COPY;  Service: Cardiovascular;  Laterality: N/A;    Family History  Problem Relation Age of Onset  . Stroke Mother   . Hypertension Mother   . Diabetes Mother   . Heart disease Father   . Heart attack Father   . Congestive Heart Failure Father   . Heart attack Sister   . Skin  cancer Brother   . Aneurysm Maternal Grandmother        Abdominal  . Heart disease Maternal Grandmother   . Stroke Maternal Grandfather   . Heart disease Maternal Grandfather   . Hypertension Brother   . Heart attack Brother   . Heart disease Brother     Social History   Socioeconomic History  . Marital status: Unknown    Spouse name: Not on file  . Number of children: Not on file  . Years of education: Not on file  . Highest education level: Not on file  Occupational History  . Not on file  Tobacco Use  . Smoking status: Never Smoker  . Smokeless tobacco: Current User    Types: Snuff  Substance and Sexual Activity  . Alcohol use: Never  . Drug use: Never  . Sexual activity: Not on file  Other Topics Concern  . Not on file  Social History Narrative  . Not on file   Social Determinants of Health   Financial Resource Strain: Not on file  Food Insecurity: Not on file  Transportation Needs: Not on file  Physical Activity: Not on file  Stress: Not on file  Social Connections: Not on file  Intimate Partner Violence: Not on file    Current Outpatient Medications  Medication Sig Dispense Refill  . acetaminophen (TYLENOL) 650 MG CR tablet Take 650 mg by  mouth every 8 (eight) hours as needed for pain.    Marland Kitchen alendronate (FOSAMAX) 70 MG tablet Take 70 mg by mouth every Sunday. Take with a full glass of water on an empty stomach.    Marland Kitchen allopurinol (ZYLOPRIM) 300 MG tablet Take 300 mg by mouth at bedtime.    Marland Kitchen apixaban (ELIQUIS) 2.5 MG TABS tablet Take 1 tablet (2.5 mg total) by mouth 2 (two) times daily. 60 tablet 11  . Biotin 5 MG CAPS Take 5 mg by mouth daily.    . Cholecalciferol (VITAMIN D3) 25 MCG (1000 UT) CAPS Take 1,000 Units by mouth daily.    . digoxin (LANOXIN) 0.125 MG tablet Take 0.0625 mg by mouth daily.    . furosemide (LASIX) 40 MG tablet Take 40 mg by mouth daily.    . metoprolol tartrate (LOPRESSOR) 50 MG tablet Take 50 mg by mouth 2 (two) times daily.    .  mirtazapine (REMERON) 15 MG tablet Take 7.5 mg by mouth at bedtime.    . Multiple Vitamin (MULTIVITAMIN WITH MINERALS) TABS tablet Take 1 tablet by mouth daily.    . Multiple Vitamins-Minerals (PRESERVISION AREDS 2) CAPS Take 1 capsule by mouth 2 (two) times daily.    Marland Kitchen omega-3 acid ethyl esters (LOVAZA) 1 g capsule Take 2 g by mouth 2 (two) times daily.    Marland Kitchen omeprazole (PRILOSEC) 20 MG capsule Take 20 mg by mouth daily as needed (Heartburn).    Vladimir Faster Glycol-Propyl Glycol (SYSTANE OP) Place 1 drop into both eyes 2 (two) times daily.    . potassium chloride SA (KLOR-CON) 20 MEQ tablet Take 20 mEq by mouth 2 (two) times daily.    . simvastatin (ZOCOR) 20 MG tablet Take 20 mg by mouth daily.    Loura Pardon Salicylate (HM ARTHRITIS CREME EX) Apply 1 application topically daily as needed (pain).    . Turmeric 500 MG TABS Take 500 mg by mouth daily.     No current facility-administered medications for this visit.    Allergies  Allergen Reactions  . Propranolol Other (See Comments)    Bradycardia  . Sulfamethoxazole Swelling  . Codeine Nausea And Vomiting  . Latex Itching and Rash      Review of Systems:   General:  Decreased appetite, decreased energy, no weight gain, no weight loss, no fever  Cardiac:  no chest pain with exertion, no chest pain at rest, +SOB with no exertion, no resting SOB, no PND, no orthopnea, no palpitations, + arrhythmia, + atrial fibrillation, no LE edema, no dizzy spells, no syncope  Respiratory:  + shortness of breath, no home oxygen, no productive cough, no dry cough, no bronchitis, no wheezing, no hemoptysis, no asthma, no pain with inspiration or cough, no sleep apnea, no CPAP at night  GI:   + difficulty swallowing, no reflux, no frequent heartburn, no hiatal hernia, no abdominal pain, no constipation, no diarrhea, no hematochezia, no hematemesis, no melena  GU:   no dysuria,  no frequency, no urinary tract infection, no hematuria, no  kidney stones, +  kidney disease  Vascular:  no pain suggestive of claudication, no pain in feet, no leg cramps, no varicose veins, no DVT, no non-healing foot ulcer  Neuro:   + stroke, no TIA's, no seizures, no headaches, no temporary blindness one eye,  no slurred speech, no peripheral neuropathy, + chronic pain, mild instability of gait, mild memory/cognitive dysfunction  Musculoskeletal: + arthritis, no joint swelling, no myalgias, no difficulty walking, normal  mobility   Skin:   no rash, no itching, no skin infections, no pressure sores or ulcerations  Psych:   no anxiety, no depression, no nervousness, no unusual recent stress  Eyes:   + blurry vision, no floaters, + recent vision changes, + wears glasses or contacts  ENT:   no hearing loss, no loose or painful teeth but only 7 teeth remaining in poor condition, no dentures, last saw dentist many years ago  Hematologic:  no easy bruising, no abnormal bleeding, no clotting disorder, no frequent epistaxis  Endocrine:  no diabetes, does not check CBG's at home           Physical Exam:   BP (!) 162/109 (BP Location: Right Arm, Patient Position: Sitting)   Pulse 80   Resp 20   Ht 5\' 6"  (1.676 m)   Wt 125 lb (56.7 kg)   SpO2 99% Comment: RA  BMI 20.18 kg/m   General:  Elderly and somewhat frail but otherwise well-appearing  HEENT:  Unremarkable   Neck:   no JVD, no bruits, no adenopathy   Chest:   clear to auscultation, symmetrical breath sounds, no wheezes, no rhonchi   CV:   RRR, grade III/VI holosystolic murmur heard best at apex,  no diastolic murmur  Abdomen:  soft, non-tender, no masses   Extremities:  warm, well-perfused, pulses not palpable, no LE edema  Rectal/GU  Deferred  Neuro:   Grossly non-focal and symmetrical throughout  Skin:   Clean and dry, no rashes, no breakdown   Diagnostic Tests:  ECHOCARDIOGRAM REPORT       Patient Name:  Teresa Hughes Date of Exam: 07/13/2020  Medical Rec #: [id number]    Height:     66.0 in  Accession #:  [id number]    Weight:    124.6 lb  Date of Birth: 13-Oct-1940    BSA:     1.635 m  Patient Age:  9 years     BP:      138/90 mmHg  Patient Gender: F        HR:      81 bpm.  Exam Location: [facility]   Procedure: 2D Echo   Indications:  Chronic diastolic congestive heart failure (HCC) [I50.32         (ICD-10-CM)    History:    Patient has no prior history of Echocardiogram  examinations.         Mitral Valve Disease, Arrythmias:Atrial Fibrillation,         Signs/Symptoms:Hypertensive Heart Disease; Risk         Factors:Dyslipidemia.    Sonographer:  Luane School  Referring Phys: [phone] [place]    1. Left ventricular ejection fraction, by estimation, is 60 to 65%. The  left ventricle has normal function. The left ventricle has no regional  wall motion abnormalities. Left ventricular diastolic parameters are  indeterminate.  2. Right ventricular systolic function is normal. The right ventricular  size is normal. There is moderately elevated pulmonary artery systolic  pressure.  3. Left atrial size was severely dilated.  4. Consider TEE.. The mitral valve is abnormal. Moderate to severe mitral  valve regurgitation. No evidence of mitral stenosis. There is moderate  holosystolic prolapse of the middle scallop of the posterior leaflet of  the mitral valve.  5. Mild flail of posterior leaflet.  6. Tricuspid valve regurgitation is mild to moderate.  7. The aortic valve is normal in structure. There is mild  calcification  of the aortic valve. There is mild thickening of the aortic valve. Aortic  valve regurgitation is mild to moderate. No aortic stenosis is present.  8. There is mild dilatation of the ascending aorta, measuring 39 mm.  9. The inferior vena cava is normal in size with greater than 50%  respiratory variability, suggesting right  atrial pressure of 3 mmHg.   FINDINGS  Left Ventricle: Left ventricular ejection fraction, by estimation, is 60  to 65%. The left ventricle has normal function. The left ventricle has no  regional wall motion abnormalities. The left ventricular internal cavity  size was normal in size. There is  no left ventricular hypertrophy. Left ventricular diastolic parameters  are indeterminate.   Right Ventricle: The right ventricular size is normal. No increase in  right ventricular wall thickness. Right ventricular systolic function is  normal. There is moderately elevated pulmonary artery systolic pressure.  The tricuspid regurgitant velocity is  3.33 m/s, and with an assumed right atrial pressure of 3 mmHg, the  estimated right ventricular systolic pressure is 86.7 mmHg.   Left Atrium: Left atrial size was severely dilated.   Right Atrium: Right atrial size was normal in size.   Pericardium: There is no evidence of pericardial effusion.   Mitral Valve: Consider TEE. The mitral valve is abnormal. There is  moderate holosystolic prolapse of the middle scallop of the posterior  leaflet of the mitral valve. Mild flail of the middle scallop of the  posterior mitral valve leaflet. There is mild  thickening of the mitral valve leaflet(s). Moderate to severe mitral valve  regurgitation. No evidence of mitral valve stenosis.   Tricuspid Valve: The tricuspid valve is normal in structure. Tricuspid  valve regurgitation is mild to moderate. No evidence of tricuspid  stenosis.   Aortic Valve: The aortic valve is normal in structure. There is mild  calcification of the aortic valve. There is mild thickening of the aortic  valve. Aortic valve regurgitation is mild to moderate. Aortic  regurgitation PHT measures 387 msec. No aortic  stenosis is present.   Pulmonic Valve: The pulmonic valve was normal in structure. Pulmonic valve  regurgitation is not visualized. No evidence of pulmonic stenosis.    Aorta: The aortic root is normal in size and structure. There is mild  dilatation of the ascending aorta, measuring 39 mm.   Venous: The inferior vena cava is normal in size with greater than 50%  respiratory variability, suggesting right atrial pressure of 3 mmHg.   IAS/Shunts: No atrial level shunt detected by color flow Doppler.     LEFT VENTRICLE  PLAX 2D  LVIDd:     4.90 cm   Diastology  LVIDs:     2.80 cm   LV e' medial:  4.45 cm/s  LV PW:     1.00 cm   LV E/e' medial: 33.3  LV IVS:    1.20 cm   LV e' lateral:  6.70 cm/s  LVOT diam:   1.90 cm   LV E/e' lateral: 22.1  LV SV:     63  LV SV Index:  38  LVOT Area:   2.84 cm    LV Volumes (MOD)  LV vol d, MOD A2C: 60.3 ml  LV vol d, MOD A4C: 54.4 ml  LV vol s, MOD A2C: 22.5 ml  LV vol s, MOD A4C: 24.8 ml  LV SV MOD A2C:   37.8 ml  LV SV MOD A4C:   54.4 ml  LV  SV MOD BP:   35.1 ml   RIGHT VENTRICLE      IVC  RV S prime:   9.20 cm/s IVC diam: 1.90 cm  TAPSE (M-mode): 1.3 cm   LEFT ATRIUM       Index    RIGHT ATRIUM      Index  LA diam:    4.20 cm 2.57 cm/m RA Area:   17.40 cm  LA Vol (A2C):  95.8 ml 58.58 ml/m RA Volume:  44.40 ml 27.15 ml/m  LA Vol (A4C):  77.7 ml 47.51 ml/m  LA Biplane Vol: 87.9 ml 53.75 ml/m  AORTIC VALVE  LVOT Vmax:  125.00 cm/s  LVOT Vmean: 75.550 cm/s  LVOT VTI:  0.220 m  AI PHT:   387 msec    AORTA  Ao Root diam: 3.40 cm  Ao Asc diam: 3.85 cm   MITRAL VALVE        TRICUSPID VALVE  MV Area (PHT): 4.36 cm   TR Peak grad:  44.4 mmHg  MV Decel Time: 174 msec   TR Vmax:    333.00 cm/s  MV E velocity: 148.00 cm/s               SHUNTS               Systemic VTI: 0.22 m               Systemic Diam: 1.90 cm   Jenne Campus MD  Electronically signed by Jenne Campus MD  Signature Date/Time: 07/13/2020/12:06:54 PM        TRANSESOPHOGEAL ECHO REPORT       Patient Name:  Teresa Hughes Date of Exam: 09/14/2020  Medical Rec #: [id number]    Height:    66.0 in  Accession #:  [id number]    Weight:    125.0 lb  Date of Birth: [birth year]    BSA:     1.638 m  Patient Age:  89 years     BP:      142/82 mmHg  Patient Gender: F        HR:      64 bpm.  Exam Location: Inpatient   Procedure: Transesophageal Echo and 3D Echo   Indications:   Severe Mitral Regurgitation    History:     Patient has prior history of Echocardiogram examinations,  most          recent 07/13/2020. CHF, Stroke, Arrythmias:Atrial  Fibrillation;          Risk Factors:Dyslipidemia.    Sonographer:   Bernadene Person RDCS  Referring Phys: [id number] Richardo Priest  Diagnosing Phys: Buford Dresser MD   PROCEDURE: After discussion of the risks and benefits of a TEE, an  informed consent was obtained from the patient. The transesophogeal probe  was passed without difficulty through the esophogus of the patient.  Sedation performed by different physician.  The patient was monitored while under deep sedation. Anesthestetic  sedation was provided intravenously by Anesthesiology: 198.25mg  of  Propofol, 60mg  of Lidocaine. Image quality was good. The patient's vital  signs; including heart rate, blood pressure,  and oxygen saturation; remained stable throughout the procedure. The  patient developed no complications during the procedure.   IMPRESSIONS    1. Left ventricular ejection fraction, by estimation, is 50 to 55%. The  left ventricle has low normal function.  2. Right ventricular systolic function is normal. The right ventricular  size is normal.  3. No left atrial/left atrial  appendage thrombus was detected.  4. A small pericardial effusion is present.  5. The mitral valve is abnormal. Severe mitral valve regurgitation. There  is severe  holosystolic prolapse of the middle scallop of the posterior  leaflet of the mitral valve.  6. The aortic valve is tricuspid. Aortic valve regurgitation is mild. No  aortic stenosis is present.  7. There is Moderate (Grade III) plaque involving the descending aorta.   Conclusion(s)/Recommendation(s): There is a flail P2 leaflet with  holosystolic prolapse. MR jet wraps entire atrium in an eccentric manner,  consistent with severe MR. There is prolapse of a small chordal structure  and there is also an independently  mobile chord in the subvalvular apparatus. This may be cause of flail.   FINDINGS  Left Ventricle: Left ventricular ejection fraction, by estimation, is 50  to 55%. The left ventricle has low normal function. The left ventricular  internal cavity size was normal in size.   Right Ventricle: The right ventricular size is normal. No increase in  right ventricular wall thickness. Right ventricular systolic function is  normal.   Left Atrium: Left atrial size was not assessed. No left atrial/left atrial  appendage thrombus was detected.   Right Atrium: Right atrial size was not assessed.   Pericardium: A small pericardial effusion is present.   Mitral Valve: The mitral valve is abnormal. There is severe holosystolic  prolapse of the middle scallop of the posterior leaflet of the mitral  valve. There is mild thickening of the mitral valve leaflet(s). Severe  mitral valve regurgitation. There is no  evidence of mitral valve vegetation.   Tricuspid Valve: The tricuspid valve is normal in structure. Tricuspid  valve regurgitation is mild . No evidence of tricuspid stenosis. There is  no evidence of tricuspid valve vegetation.   Aortic Valve: The aortic valve is tricuspid. Aortic valve regurgitation is  mild. No aortic stenosis is present. There is no evidence of aortic valve  vegetation.   Pulmonic Valve: The pulmonic valve was grossly normal. Pulmonic valve   regurgitation is mild. No evidence of pulmonic stenosis. There is no  evidence of pulmonic valve vegetation.   Aorta: The aortic root and ascending aorta are structurally normal, with  no evidence of dilitation. There is moderate (Grade III) plaque involving  the descending aorta.   IAS/Shunts: No atrial level shunt detected by color flow Doppler.   Buford Dresser MD  Electronically signed by Buford Dresser MD  Signature Date/Time: 09/14/2020/6:23:50 PM       RIGHT/LEFT HEART CATH AND CORONARY ANGIOGRAPHY    Conclusion  1.  Severe two-vessel coronary artery disease with severe complex stenosis of the mid RCA and severe stenosis of the proximal LAD 2.  Known severe mitral regurgitation with right heart catheterization demonstrating preserved cardiac output, mild pulmonary hypertension with mean PA pressure 27, and pulmonary wedge V wave of 17 mmHg  Recommendations: Continued multidisciplinary heart team review.  Recommend formal surgical consultation.  I am not sure this elderly patient will be candidate for conventional surgery.  Other considerations would be PCI and transcatheter edge-to-edge repair of the mitral valve versus palliative medical therapy. Heart team review with Dr Bettina Gavia and multidisciplinary valve team.   Indications  Severe mitral regurgitation [I34.0 (ICD-10-CM)]   Procedural Details  Technical Details INDICATION: Severe symptomatic mitral regurgitation  PROCEDURAL DETAILS: There was an indwelling IV in a right antecubital vein. Using normal sterile technique, the IV was changed out for a 5 Fr brachial sheath over  a 0.018 inch wire. The right wrist was then prepped, draped, and anesthetized with 1% lidocaine. Using the modified Seldinger technique a 5/6 French Slender sheath was placed in the right radial artery. Intra-arterial verapamil was administered through the radial artery sheath. IV heparin was administered after a JR4 catheter was advanced  into the central aorta. A Swan-Ganz catheter was used for the right heart catheterization. Standard protocol was followed for recording of right heart pressures and sampling of oxygen saturations. Fick cardiac output was calculated. Standard Judkins catheters were used for selective coronary angiography. LV pressure is recorded and an aortic valve pullback is performed. There were no immediate procedural complications. The patient was transferred to the post catheterization recovery area for further monitoring.    Estimated blood loss <50 mL.   During this procedure medications were administered to achieve and maintain moderate conscious sedation while the patient's heart rate, blood pressure, and oxygen saturation were continuously monitored and I was present face-to-face 100% of this time.   Medications (Filter: Administrations occurring from 0731 to 0821 on 10/29/20) (important) Continuous medications are totaled by the amount administered until 10/29/20 0821.    midazolam (VERSED) injection (mg) Total dose:  1 mg  Date/Time Rate/Dose/Volume Action   10/29/20 0749 1 mg Given    fentaNYL (SUBLIMAZE) injection (mcg) Total dose:  25 mcg  Date/Time Rate/Dose/Volume Action   10/29/20 0749 25 mcg Given    lidocaine (PF) (XYLOCAINE) 1 % injection (mL) Total volume:  4 mL  Date/Time Rate/Dose/Volume Action   10/29/20 0751 2 mL Given   0752 2 mL Given    Radial Cocktail/Verapamil only (mL) Total volume:  10 mL  Date/Time Rate/Dose/Volume Action   10/29/20 0756 10 mL Given    Heparin (Porcine) in NaCl 1000-0.9 UT/500ML-% SOLN (mL) Total volume:  1,000 mL  Date/Time Rate/Dose/Volume Action   10/29/20 0756 500 mL Given   0756 500 mL Given    heparin sodium (porcine) injection (Units) Total dose:  3,000 Units  Date/Time Rate/Dose/Volume Action   10/29/20 0802 3,000 Units Given    iohexol (OMNIPAQUE) 350 MG/ML injection (mL) Total volume:  50 mL  Date/Time  Rate/Dose/Volume Action   10/29/20 0815 50 mL Given    Sedation Time  Sedation Time Physician-1: 24 minutes 14 seconds   Contrast  Medication Name Total Dose  iohexol (OMNIPAQUE) 350 MG/ML injection 50 mL    Radiation/Fluoro  Fluoro time: 4.4 (min) DAP: 7638 (mGycm2) Cumulative Air Kerma: 158 (mGy)   Coronary Findings   Diagnostic Dominance: Right  Left Main  Vessel is large. There is mild diffuse disease throughout the vessel. The left main is patent with no significant obstruction. The vessel divides into the LAD and left circumflex.  Left Anterior Descending  Prox LAD lesion is 75% stenosed with 0% stenosed side branch in 1st Diag. The lesion is moderately calcified. The proximal LAD has a long segment 75 to 80% stenosis. There is a proximal landing zone at the ostium if stenting is to be considered. There is poststenotic dilatation of the LAD with an area of ectasia. The mid LAD is diffusely diseased after the second diagonal branch with 60 to 70% stenosis but in a small caliber mid to distal LAD.  Mid LAD lesion is 65% stenosed. The LAD is a small to medium caliber vessel throughout this segment  Left Circumflex  There is mild diffuse disease throughout the vessel. The circumflex has diffuse plaquing but no high-grade stenoses. The first OM is very small.  The distal circumflex supplies 2 large obtuse marginal branches that have mild diffuse plaquing but no significant stenoses. Those vessels are tortuous.  Prox Cx lesion is 40% stenosed.  Right Coronary Artery  The mid RCA is severely diseased. There is 80 to 90% stenosis followed by a discrete 95% stenosis then there is segmental disease at the junction of the mid/distal RCA of approximately 50 to 60%. The distal vessel is patent. The PDA and PLA branches are patent.  Prox RCA lesion is 90% stenosed. The lesion is moderately calcified.  Mid RCA-1 lesion is 95% stenosed. The lesion is focal.  Mid RCA-2 lesion is 60%  stenosed. The lesion is moderately calcified.   Intervention   No interventions have been documented.  Coronary Diagrams   Diagnostic Dominance: Right    Intervention    Implants    No implant documentation for this case.    Syngo Images  Show images for CARDIAC CATHETERIZATION  Images on Long Term Storage  Show images for Mahaley, Schwering to Procedure Log  Procedure Log     Hemo Data  Flowsheet Row Most Recent Value  Fick Cardiac Output 4.13 L/min  Fick Cardiac Output Index 2.52 (L/min)/BSA  RA A Wave -99 mmHg  RA V Wave 4 mmHg  RA Mean 2 mmHg  RV Systolic Pressure 41 mmHg  RV Diastolic Pressure 0 mmHg  RV EDP 2 mmHg  PA Systolic Pressure 40 mmHg  PA Diastolic Pressure 17 mmHg  PA Mean 27 mmHg  PW A Wave -99 mmHg  PW V Wave 14 mmHg  PW Mean 11 mmHg  AO Systolic Pressure 194 mmHg  AO Diastolic Pressure 88 mmHg  AO Mean 174 mmHg  LV Systolic Pressure 081 mmHg  LV Diastolic Pressure 6 mmHg  LV EDP 22 mmHg  AOp Systolic Pressure 448 mmHg  AOp Diastolic Pressure 72 mmHg  AOp Mean Pressure 185 mmHg  LVp Systolic Pressure 631 mmHg  LVp Diastolic Pressure 5 mmHg  LVp EDP Pressure 8 mmHg  QP/QS 1  TPVR Index 10.73 HRUI  TSVR Index 46.9 HRUI  PVR SVR Ratio 0.14  TPVR/TSVR Ratio 0.23      Impression:  Patient has severe multivessel coronary artery disease and mitral valve prolapse with severe mitral regurgitation.  She describes a long history of symptoms of exertional shortness of breath and fatigue which is progressed considerably over the past year, currently New [place] Heart Association functional class IIb-III. she is not having exertional chest pain or chest tightness.  She also has longstanding persistent atrial fibrillation on long-term anticoagulation using Eliquis as well as history of previous embolic stroke with mild residual and numerous other comorbid medical problems as noted previously.  I have personally reviewed the  patient's recent transthoracic and transesophageal echocardiograms as well as her diagnostic cardiac catheterization.  Left ventricular systolic function appears mildly reduced with ejection fraction estimated 50 to 55% in the setting of severe mitral regurgitation on recent TEE.  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  Both leaflets are also somewhat thickened and there appears to be significant annular calcification.  There is severe left atrial enlargement.  Diagnostic cardiac catheterization is notable for severe multivessel coronary artery disease including somewhat long segment 70 to 75% stenosis of proximal left anterior descending coronary artery as well as long segment diffuse  severe stenosis of the mid right coronary artery.  There is nonobstructive disease in the left circumflex territory and the patient does appear to have adequate distal target vessels for coronary artery bypass grafting.  Right heart pressures were only mildly elevated.  I agree the patient would benefit from coronary artery bypass grafting and mitral valve repair with or without concomitant Maze procedure.  However, risks associated with elective surgery would be relatively high because of the patient's advanced age and severe comorbid medical problems.  Even without significant perioperative complication I would anticipate that the patient's postoperative convalescence would be relatively slow and potentially require at least short-term placement in skilled nursing facility for rehabilitation.   Plan:  The patient and her daughter were counseled at length regarding treatment alternatives for management of severe symptomatic multivessel coronary artery disease and mitral regurgitation.  Alternative approaches such as  coronary artery bypass grafting with mitral valve repair or replacement, percutaneous coronary intervention with or without edge-to-edge Mitraclip repair, and continued medical therapy without intervention were compared and contrasted at length.  The risks associated with high risk conventional surgery were discussed in detail, as were expectations for her post-operative convalescence.  Issues specific to percutaneous coronary intervention with or without Mitraclip repair were discussed including questions about long term freedom from myocardial infarction and/or persistent or recurrent mitral regurgitation were discussed.   The patient is not interested in considering high risk surgical intervention for elective coronary artery bypass grafting and mitral valve repair or replacement.  She prefers to consider a less invasive strategy for management of her complex cardiac disease even if it comes with somewhat attenuated long-term prognosis.  As the next step she will be referred to the dental service clinic because of concerns related to her poor dentition.  She will follow-up with Dr. Burt Knack in the multidisciplinary heart valve clinic in the near future.  All of their questions have been addressed.    I spent in excess of 90 minutes during the conduct of this office consultation and >50% of this time involved direct face-to-face encounter with the patient for counseling and/or coordination of their care.      Valentina Gu. Roxy Manns, MD 11/08/2020 3:16 PM

## 2020-11-08 NOTE — Patient Instructions (Addendum)
Continue all previous medications without any changes at this time  

## 2020-11-10 ENCOUNTER — Telehealth: Payer: Self-pay

## 2020-11-10 NOTE — Telephone Encounter (Signed)
  HEART AND [facility] VALVE TEAM  Teresa Hughes is being evaluated by the Structural Heart team for CAD and Severe Mitral Regurgitation. Dr Roxy Manns and Dr Burt Knack have discussed the pt's plan of care and as a first step we will refer the pt to Dr Benson Norway for dental evaluation and clearance. Once pt is cleared from a dental standpoint we will arrange PCI of the RCA and FFR-PCI of the LAD.  The pt will need to be loaded with plavix in preparation for coronary intervention. Per Dr Burt Knack we can bring the pt back into the office prior to PCI for further discussion or if the pt prefers then PCI can be scheduled. After recovery from PCI we will plan to proceed with MitraClip.  I reviewed this plan with the pt over the phone and she verbalizes understanding.

## 2020-11-23 ENCOUNTER — Ambulatory Visit (INDEPENDENT_AMBULATORY_CARE_PROVIDER_SITE_OTHER): Payer: Self-pay | Admitting: Dentistry

## 2020-11-23 ENCOUNTER — Other Ambulatory Visit: Payer: Self-pay

## 2020-11-23 VITALS — BP 135/90 | HR 76 | Temp 98.1°F

## 2020-11-23 DIAGNOSIS — Z7901 Long term (current) use of anticoagulants: Secondary | ICD-10-CM

## 2020-11-23 DIAGNOSIS — K030 Excessive attrition of teeth: Secondary | ICD-10-CM

## 2020-11-23 DIAGNOSIS — K08109 Complete loss of teeth, unspecified cause, unspecified class: Secondary | ICD-10-CM

## 2020-11-23 DIAGNOSIS — I340 Nonrheumatic mitral (valve) insufficiency: Secondary | ICD-10-CM

## 2020-11-23 DIAGNOSIS — I2510 Atherosclerotic heart disease of native coronary artery without angina pectoris: Secondary | ICD-10-CM

## 2020-11-23 DIAGNOSIS — Z01818 Encounter for other preprocedural examination: Secondary | ICD-10-CM

## 2020-11-23 DIAGNOSIS — K06010 Localized gingival recession, unspecified: Secondary | ICD-10-CM

## 2020-11-23 DIAGNOSIS — K0820 Unspecified atrophy of edentulous alveolar ridge: Secondary | ICD-10-CM

## 2020-11-23 DIAGNOSIS — K0850 Unsatisfactory restoration of tooth, unspecified: Secondary | ICD-10-CM

## 2020-11-23 DIAGNOSIS — K0889 Other specified disorders of teeth and supporting structures: Secondary | ICD-10-CM

## 2020-11-23 NOTE — Patient Instructions (Signed)
Teresa Hughes, D.M.D. Phone: [phone] Fax: [phone]   It was a pleasure seeing you today!  Please refer to the information below regarding your dental visit, and call us should you have any questions or concerns that may come up after you leave.   Thank you for giving [place] the opportunity to provide care for you.  If there is anything we can do for you, please let us know.    HEART VALVES AND MOUTH CARE   FACTS:  If you have any infection in your mouth, it can infect your heart valve.  If you heart valve is infected, you will be seriously ill.  Infections in the mouth can be SILENT and do not always cause pain.  Examples of infections in the mouth are gum disease, dental cavities, and abscesses.  Some possible signs of infection are: Bad breath, bleeding gums, or teeth that are sensitive to sweets, hot, and/or cold. There are many other signs as well.   WHAT YOU HAVE TO DO:  Brush your teeth after meals and at bedtime.  Spend at least 2 minutes brushing well, especially behind your back teeth and all around your teeth that stand alone.  Brush at the gumline also.  Do not go to bed without brushing your teeth and flossing.  If your gums bleed when you brush or floss, do NOT stop brushing or flossing.  Bleeding can be a sign of inflammation or irritation from bacteria.  It usually means that your gums need more attention and better cleaning.   If your dentist or Dr. Benson Hughes gave you a prescription mouthwash to use, make sure to use it as directed. If you run out of the medication, get a refill at the pharmacy.  If you were given any other medications or directions by your dentist, please follow them.  If you did not understand the directions or forget what you were told, please call.  We will be happy to refresh your memory.  If you need antibiotics before dental procedures, make sure you take them one hour prior to every  dental visit as directed.   Get a dental check-up every 4-6 months in order to keep your mouth healthy, or to find and treat any new infection. You will most likely need your teeth cleaned or gums treated at the same time.  If you are not able to come in for your scheduled appointment, call your dentist as soon as possible to reschedule.  If you have a problem in between dental visits, call your dentist.   QUESTIONS? Marland Kitchen Call our office during office hours [phone].    WE ARE A TEAM.  OUR GOAL IS:  HEALTHY MOUTH, HEALTHY HEART

## 2020-11-23 NOTE — Progress Notes (Signed)
Department of Dental Medicine     OUTPATIENT CONSULTATION  Service Date:   11/23/2020  Patient Name:  Teresa Hughes Date of Birth:   [birth year] Medical Record Number: [id number]  Referring Provider:              Darylene Price, MD   PLAN & RECOMMENDATIONS   > There are no current signs of acute dental infection including abscess, edema or erythema, or suspicious lesion requiring biopsy.  The patient does have few remaining teeth with severe wear which inhibits her from wearing her lower partial denture. >> Recommend the patient establish care at a dental office of their choice for routine dental care including replacement of missing teeth, cleanings and exams to decrease the risk of perioperative and postoperative systemic infection and/or other complications.  There are no recommendations for dental intervention prior to cardiac surgery at this time. >>> Plan to discuss with medical team and coordinate treatment as needed.  >>  Discussed in detail all treatment options with the patient and they are agreeable to the plan.   Thank you for consulting with [facility] and for the opportunity to participate in this patient's treatment.  Should you have any questions or concerns, please contact the [facility] at [phone].  11/23/2020      CONSULT NOTE   COVID 19 SCREENING: The patient denies symptoms concerning for COVID-19 infection including fever, chills, cough, or newly developed shortness of breath.   HISTORY OF PRESENT ILLNESS: >> Teresa Hughes is a very pleasant 80 y.o. female with h/o mitral valve prolapse with mitral regurgitation, severe multivessel coronary artery disease, longstanding persistent atrial fibrillation on long-term anticoagulation, embolic stroke, chronic diastolic congestive heart failure, chronic kidney disease, degenerative arthritis with chronic back pain, esophageal stricture, GE reflux disease, macular degeneration, and hiatal  hernia who is anticipating MVR for multivessel CAD and mitral regurgitation.  The patient presents today for a medically necessary dental consultation as part of their pre-cardiac surgery work-up.  DENTAL HISTORY: > The patient reports that it has been a few years since she has last seen a dentist.  She has an upper complete maxillary denture which she does wear but it does not fit well per the patient since she had a stroke.  She also has a lower partial denture that she cannot wear at all since her remaining lower teeth are worn down and she cannot fit the clasps onto them anymore.  She currently denies any dental/orofacial pain or sensitivity. >> Patient is able to manage oral secretions.  Patient denies dysphagia, odynophagia, dysphonia, SOB and neck pain.  Patient denies fever, rigors and malaise.   CHIEF COMPLAINT:  Here for a preoperative dental evaluation.   Patient Active Problem List   Diagnosis Date Noted  . Coronary artery disease   . Nonrheumatic mitral valve regurgitation   . Chronic renal disease   . Vitamin D deficiency   . Paroxysmal SVT (supraventricular tachycardia) ([place])   . Osteoporosis   . Osteopenia   . Macular degeneration, dry   . Late effects of CVA (cerebrovascular accident)   . Hypercalcemia   . Hiatal hernia   . Gout   . GERD (gastroesophageal reflux disease)   . Esophageal stricture   . DDD (degenerative disc disease), cervical   . CKD (chronic kidney disease) stage 3, GFR 30-59 ml/min (H[facility])   . Chronic insomnia   . CHF (congestive heart failure) ([place])   . Anxiety   . Anemia of  chronic disease   . Acute kidney injury ([place])   . Aneurysm ([place]) 07/01/2018  . Chronic anticoagulation 08/28/2016  . Chronic atrial fibrillation ([place]) 08/28/2016  . High risk medication use 08/28/2016  . Hypertensive heart disease without heart failure 08/28/2016  . Other hyperlipidemia 08/28/2016  . Cerebral thrombosis with cerebral infarction ([place]) 05/07/2012   Past  Medical History:  Diagnosis Date  . Acute kidney injury ([place])   . Anemia of chronic disease   . Aneurysm ([place]) 07/01/2018  . Anxiety   . Cerebral thrombosis with cerebral infarction ([place]) 05/07/2012  . CHF (congestive heart failure) ([place])   . Chronic anticoagulation 08/28/2016  . Chronic atrial fibrillation ([place]) 08/28/2016  . Chronic insomnia   . Chronic renal disease   . Coronary artery disease   . DDD (degenerative disc disease), cervical   . Esophageal stricture   . GERD (gastroesophageal reflux disease)   . Gout   . Hiatal hernia   . High risk medication use 08/28/2016  . Hypercalcemia   . Hypertensive heart disease without heart failure 08/28/2016  . Late effects of CVA (cerebrovascular accident)   . Macular degeneration, dry   . Osteopenia   . Osteoporosis   . Other hyperlipidemia 08/28/2016  . Paroxysmal SVT (supraventricular tachycardia) ([place])   . Vitamin D deficiency    Past Surgical History:  Procedure Laterality Date  . CATARACT EXTRACTION Bilateral    Right 04/02/17, Left 9/22018  . ERCP W/ SPHINCTEROTOMY AND BALLOON DILATION    . HEMORRHOID SURGERY  2016  . RIGHT/LEFT HEART CATH AND CORONARY ANGIOGRAPHY N/A 10/29/2020   Procedure: RIGHT/LEFT HEART CATH AND CORONARY ANGIOGRAPHY;  Surgeon: [name], MD;  Location: [facility];  Service: Cardiovascular;  Laterality: N/A;  . TEE WITHOUT CARDIOVERSION N/A 09/14/2020   Procedure: TRANSESOPHAGEAL ECHOCARDIOGRAM (TEE);  Surgeon: Buford Dresser, MD;  Location: [facility] ENDOSCOPY;  Service: Cardiovascular;  Laterality: N/A;   Allergies  Allergen Reactions  . Propranolol Other (See Comments)    Bradycardia  . Sulfamethoxazole Swelling  . Codeine Nausea And Vomiting  . Latex Itching and Rash   Current Outpatient Medications  Medication Sig Dispense Refill  . acetaminophen (TYLENOL) 650 MG CR tablet Take 650 mg by mouth every 8 (eight) hours as needed for pain.    Marland Kitchen alendronate (FOSAMAX) 70 MG tablet Take 70  mg by mouth every Sunday. Take with a full glass of water on an empty stomach.    Marland Kitchen allopurinol (ZYLOPRIM) 300 MG tablet Take 300 mg by mouth at bedtime.    Marland Kitchen apixaban (ELIQUIS) 2.5 MG TABS tablet Take 1 tablet (2.5 mg total) by mouth 2 (two) times daily. 60 tablet 11  . Biotin 5 MG CAPS Take 5 mg by mouth daily.    . Cholecalciferol (VITAMIN D3) 25 MCG (1000 UT) CAPS Take 1,000 Units by mouth daily.    . digoxin (LANOXIN) 0.125 MG tablet Take 0.0625 mg by mouth daily.    . furosemide (LASIX) 40 MG tablet Take 40 mg by mouth daily.    . metoprolol tartrate (LOPRESSOR) 50 MG tablet Take 50 mg by mouth 2 (two) times daily.    . mirtazapine (REMERON) 15 MG tablet Take 7.5 mg by mouth at bedtime.    . Multiple Vitamin (MULTIVITAMIN WITH MINERALS) TABS tablet Take 1 tablet by mouth daily.    . Multiple Vitamins-Minerals (PRESERVISION AREDS 2) CAPS Take 1 capsule by mouth 2 (two) times daily.    Marland Kitchen omega-3 acid ethyl esters (LOVAZA) 1  g capsule Take 2 g by mouth 2 (two) times daily.    Marland Kitchen omeprazole (PRILOSEC) 20 MG capsule Take 20 mg by mouth daily as needed (Heartburn).    Vladimir Faster Glycol-Propyl Glycol (SYSTANE OP) Place 1 drop into both eyes 2 (two) times daily.    . potassium chloride SA (KLOR-CON) 20 MEQ tablet Take 20 mEq by mouth 2 (two) times daily.    . simvastatin (ZOCOR) 20 MG tablet Take 20 mg by mouth daily.    Loura Pardon Salicylate (HM ARTHRITIS CREME EX) Apply 1 application topically daily as needed (pain).    . Turmeric 500 MG TABS Take 500 mg by mouth daily.     No current facility-administered medications for this visit.    LABS: Lab Results  Component Value Date   WBC 7.4 10/20/2020   HGB 12.2 10/29/2020   HCT 36.0 10/29/2020   MCV 92 10/20/2020   PLT 201 10/20/2020      Component Value Date/Time   NA 143 10/29/2020 0803   NA 143 10/20/2020 1656   K 3.5 10/29/2020 0803   CL 103 10/20/2020 1656   CO2 25 10/20/2020 1656   GLUCOSE 96 10/20/2020 1656   BUN 22  10/20/2020 1656   CREATININE 1.14 (H) 10/20/2020 1656   CALCIUM 9.2 10/20/2020 1656   GFRNONAA 56 (L) 09/08/2020 1135   GFRAA 64 09/08/2020 1135   No results found for: INR, PROTIME No results found for: PTT  Social History   Socioeconomic History  . Marital status: Unknown    Spouse name: Not on file  . Number of children: Not on file  . Years of education: Not on file  . Highest education level: Not on file  Occupational History  . Not on file  Tobacco Use  . Smoking status: Never Smoker  . Smokeless tobacco: Current User    Types: Snuff  Substance and Sexual Activity  . Alcohol use: Never  . Drug use: Never  . Sexual activity: Not on file  Other Topics Concern  . Not on file  Social History Narrative  . Not on file   Social Determinants of Health   Financial Resource Strain: Not on file  Food Insecurity: Not on file  Transportation Needs: Not on file  Physical Activity: Not on file  Stress: Not on file  Social Connections: Not on file  Intimate Partner Violence: Not on file   Family History  Problem Relation Age of Onset  . Stroke Mother   . Hypertension Mother   . Diabetes Mother   . Heart disease Father   . Heart attack Father   . Congestive Heart Failure Father   . Heart attack Sister   . Skin cancer Brother   . Aneurysm Maternal Grandmother        Abdominal  . Heart disease Maternal Grandmother   . Stroke Maternal Grandfather   . Heart disease Maternal Grandfather   . Hypertension Brother   . Heart attack Brother   . Heart disease Brother     REVIEW OF SYSTEMS: Reviewed with the patient as per HPI. PSYCH: Patient denies having dental phobia.   VITAL SIGNS: BP 135/90 (BP Location: Right Arm)   Pulse 76   Temp 98.1 F (36.7 C) (Oral)    PHYSICAL EXAM: >> General:  Well-developed, comfortable and in no apparent distress. >> Neurological:  Alert and oriented to person, place and  time. >> Extraoral:  Facial symmetry present without any  edema or erythema.  No swelling or lymphadenopathy.  TMJ asymptomatic without clicks or crepitations. >> Intraoral:  Soft tissues appear well-perfused and mucous membranes moist.  FOM and vestibules soft and not raised. Oral cavity without mass or lesion. No signs of infection, parulis, sinus tract, edema or erythema evident upon exam.   DENTAL EXAM: Hard tissue exam completed and charted.  >> Dentition:  Overall fair remaining dentition.  Missing teeth, existing restorations/crown and bridge work. >> The patient is maintaining fair oral hygiene.  >> Periodontal: Pink, healthy gingival tissue with blunted papilla. Localized gingival recession on distal of #22 and #28. >> Defective Restorations: #22 has a core build-up/composite restoration covering occlusal surface which is chipped and has poor marginal integrity. >> Endodontics: #22 previous root canal treated without definitive crown. >> Removable/Fixed Prosthodontics: Existing maxillary complete denture and mandibular RPD- lower partial is ill-fitting and the patient can no longer wear it. Full-coverage all ceramic crowns on teeth numbers 23, 24 and 26. PFM crown on tooth #27. >> Occlusion: Unable to assess molar occlusion.  Non-functional teeth #22-#28. >> Other findings: Severe attrition on occlusal surfaces/incisal edges of #22, #25 and #28. Moderate-severe atrophy of edentulous alveolar ridges of maxilla and mandible.   RADIOGRAPHIC EXAM: PAN and 3 Periapical images exposed and interpreted.  >> Condyles seated bilaterally in fossas.  No evidence of abnormal pathology.  All visualized osseous structures appear WNL.  >> Localized mild horizontal bone loss consistent with mild periodontitis vs gingival recession.   >> Missing teeth, #22 previous endodontically treated without definitive restoration, gutta percha ~2 mm short of apex and appears adequate. #23, #24, #26 and #27 with full-coverage crowns.   ASSESSMENT:  1. Mitral  regurgitation 2. Coronary artery disease 3. Preoperative dental consultation 4. Long-term use of anticoagulant (on Eliquis) 5. Missing teeth 6. Excessive attrition of teeth 7. Gingival recession, localized 8. Atrophy of edentulous alveolar ridge 9. Defective dental restoration 10. Ill-fitting dentures 11. Postoperative bleeding risk   PLAN AND RECOMMENDATIONS: >  I discussed the risks, benefits, and complications of various scenarios with the patient in relationship to their medical and dental conditions, which included systemic infection such as endocarditis, bacteremia or other serious issues that could potentially occur either before, during or after their anticipated surgery if dental/oral concerns are not addressed.  I explained that if any chronic or acute dental/oral infection(s) are addressed and subsequently not maintained following medical optimization and recovery, their risk of the previously mentioned complications are just as high and could potentially occur postoperatively.  I explained all significant findings of the dental consultation with the patient including the few remaining teeth she has on the bottom being severely worn down and are non-functional and the recommended care including eventual extractions of her remaining teeth and have a new complete upper and lower denture made in order to optimize them from a dental standpoint.  Explained to the patient that this does not need to be completed prior to her heart surgery since the teeth are not infected or decayed, but it is something to start thinking about once she recovers so she can improve her ability to talk and chew again. The patient verbalized understanding of all findings, discussion, and recommendations. >>  We then discussed various treatment options to include no treatment, multiple extractions with alveoloplasty, pre-prosthetic surgery as indicated, periodontal therapy, dental restorations, root canal therapy, crown  and bridge therapy, implant therapy, and replacement of missing teeth as indicated.  The patient verbalized understanding of all options, and currently wishes to  proceed with finding an outside dental provider to establish care with for treatment including extractions, routine exams and replacement of missing teeth as soon as she is medically optimized and recovered from her heart surgery. >>>  Plan to discuss all findings and recommendations with medical team and coordinate future care as needed.  <> The patient tolerated today's visit well.  All questions and concerns were addressed and answered, and the patient departed in stable condition.   I spent in excess of 120 minutes during the conduct of this consultation and >50% of this time involved direct face-to-face encounter for counseling and/or coordination of the patient's care. Harts Benson Norway, D.M.D.

## 2020-11-24 NOTE — Telephone Encounter (Signed)
Spoke with the patient in detail about planning. Scheduled her for visit with Nell Range on 12/16/20 to update H&P, obtain EKG and labs, and to review PCI instructions. PCI scheduled 12/22/20. The patient is grateful for call and agrees with plan.

## 2020-11-24 NOTE — Telephone Encounter (Signed)
Thank you :)

## 2020-11-24 NOTE — Telephone Encounter (Addendum)
Patient see by Dr. Benson Norway DDS ( note not yet completed) and she is cleared from a dental standpoint. Will bring her back into the office to see me the first week of May and I will set up her PCI on 5/11 with Dr. Burt Knack at that time.    Angelena Form PA-C  [facility]

## 2020-12-10 DIAGNOSIS — Z79899 Other long term (current) drug therapy: Secondary | ICD-10-CM | POA: Diagnosis not present

## 2020-12-10 DIAGNOSIS — M109 Gout, unspecified: Secondary | ICD-10-CM | POA: Diagnosis not present

## 2020-12-10 DIAGNOSIS — I5032 Chronic diastolic (congestive) heart failure: Secondary | ICD-10-CM | POA: Diagnosis not present

## 2020-12-10 DIAGNOSIS — I10 Essential (primary) hypertension: Secondary | ICD-10-CM | POA: Diagnosis not present

## 2020-12-10 DIAGNOSIS — I700 Atherosclerosis of aorta: Secondary | ICD-10-CM | POA: Diagnosis not present

## 2020-12-10 DIAGNOSIS — I725 Aneurysm of other precerebral arteries: Secondary | ICD-10-CM | POA: Diagnosis not present

## 2020-12-10 DIAGNOSIS — I480 Paroxysmal atrial fibrillation: Secondary | ICD-10-CM | POA: Diagnosis not present

## 2020-12-10 DIAGNOSIS — N183 Chronic kidney disease, stage 3 unspecified: Secondary | ICD-10-CM | POA: Diagnosis not present

## 2020-12-10 DIAGNOSIS — E785 Hyperlipidemia, unspecified: Secondary | ICD-10-CM | POA: Diagnosis not present

## 2020-12-10 DIAGNOSIS — Z87891 Personal history of nicotine dependence: Secondary | ICD-10-CM | POA: Diagnosis not present

## 2020-12-10 DIAGNOSIS — G8194 Hemiplegia, unspecified affecting left nondominant side: Secondary | ICD-10-CM | POA: Diagnosis not present

## 2020-12-10 DIAGNOSIS — R7301 Impaired fasting glucose: Secondary | ICD-10-CM | POA: Diagnosis not present

## 2020-12-16 ENCOUNTER — Other Ambulatory Visit: Payer: Self-pay

## 2020-12-16 ENCOUNTER — Encounter: Payer: Self-pay | Admitting: Physician Assistant

## 2020-12-16 ENCOUNTER — Ambulatory Visit: Payer: Medicare HMO | Admitting: Physician Assistant

## 2020-12-16 VITALS — BP 122/74 | HR 76 | Ht 66.0 in | Wt 125.4 lb

## 2020-12-16 DIAGNOSIS — I2510 Atherosclerotic heart disease of native coronary artery without angina pectoris: Secondary | ICD-10-CM | POA: Diagnosis not present

## 2020-12-16 DIAGNOSIS — I482 Chronic atrial fibrillation, unspecified: Secondary | ICD-10-CM

## 2020-12-16 DIAGNOSIS — I340 Nonrheumatic mitral (valve) insufficiency: Secondary | ICD-10-CM

## 2020-12-16 MED ORDER — CLOPIDOGREL BISULFATE 75 MG PO TABS: 75.0000 mg | ORAL_TABLET | Freq: Every day | ORAL | 11 refills | Status: DC

## 2020-12-16 MED ORDER — PANTOPRAZOLE SODIUM 40 MG PO TBEC: 40.0000 mg | DELAYED_RELEASE_TABLET | Freq: Every day | ORAL | 11 refills | Status: AC

## 2020-12-16 NOTE — Progress Notes (Signed)
[facility]                                     Cardiology Office Note:    Date:  12/16/2020   ID:  Teresa Hughes, DOB 09-27-1940, MRN [id number]  PCP:  Nicoletta Dress, MD  [facility] Cardiologist:  Dr. Harden [facility] Electrophysiologist:  None   Referring MD: Nicoletta Dress, MD   Follow up severe MR- set up heart cath   History of Present Illness:    Teresa Hughes is a 80 y.o. female with a hx of severe multivessel coronary artery disease, longstanding persistent atrial fibrillation on long-term anticoagulation, embolic stroke, chronic diastolic congestive heart failure, chronic kidney disease, degenerative arthritis with chronic back pain, esophageal stricture, GE reflux disease, hiatal hernia, macular degeneration, and MVP with mitral regurgitation who presents to clinic for follow up.   Patient states that she is been known of presence of a heart murmur since she was young.  She denies any previous history of coronary artery disease. She has history of previous stroke with some residual and history of longstanding persistent atrial fibrillation currently anticoagulated using Eliquis. She has long history of shortness of breath without chest discomfort dating back at least 5 or 6 years but which has progressed substantially over the past 6 to 12 months. Follow-up transthoracic echocardiogram performed July 13, 2020 revealed normal left ventricular systolic function with ejection fraction estimated 60 to 65%.  There was mitral valve prolapse with moderate to severe mitral regurgitation.  Transesophageal echocardiogram was recommended.  Patient underwent TEE on September 14, 2020 which confirmed the presence of mitral valve prolapse with severe mitral regurgitation.  There was low normal left ventricular function with ejection fraction estimated only 50 to 55% in the setting of severe mitral regurgitation. There was  severe holosystolic prolapse involving a portion of the middle scallop of the posterior leaflet.  There was severe left atrial enlargement.  The patient was referred to the multidisciplinary heart valve clinic and has been evaluated previously by Dr. Burt Knack who discussed the potential role of transcatheter edge-to-edge repair. Diagnostic cardiac catheterization was performed October 29, 2020.  Catheterization revealed severe multivessel coronary artery disease including long segment severe stenosis of the mid right coronary artery and 75% proximal stenosis of the left anterior descending coronary artery with mild nonobstructive disease in the left circumflex territory. Right heart pressures were very mildly elevated.   She was referred to Dr Roxy Manns for surgical evaluation. She was felt to be at very high risk for surgery and the patient was interested in a less invasive approach. It was decided to proceed with PCI and staged TEER. She was evaluated by dental and cleared for surgery.  Today she presents to clinic for follow up. Here with daughter. Still with dyspnea on exertion and fatigue. No chest pain. No LE edema, orthopnea or PND. No dizziness or syncope. No blood in stool or urine. No palpitations. Does have a small lump on right arm from previous cath. She thinks it might be growing. Not painful.    Past Medical History:  Diagnosis Date  . Acute kidney injury ([place])   . Anemia of chronic disease   . Aneurysm ([place]) 07/01/2018  . Anxiety   . Cerebral thrombosis with cerebral infarction ([place]) 05/07/2012  . CHF (congestive heart failure) ([place])   .  Chronic anticoagulation 08/28/2016  . Chronic atrial fibrillation ([place]) 08/28/2016  . Chronic insomnia   . Chronic renal disease   . Coronary artery disease   . DDD (degenerative disc disease), cervical   . Esophageal stricture   . GERD (gastroesophageal reflux disease)   . Gout   . Hiatal hernia   . High risk medication use 08/28/2016  . Hypercalcemia    . Hypertensive heart disease without heart failure 08/28/2016  . Late effects of CVA (cerebrovascular accident)   . Macular degeneration, dry   . Osteopenia   . Osteoporosis   . Other hyperlipidemia 08/28/2016  . Paroxysmal SVT (supraventricular tachycardia) ([place])   . Vitamin D deficiency     Past Surgical History:  Procedure Laterality Date  . CATARACT EXTRACTION Bilateral    Right 04/02/17, Left 9/22018  . ERCP W/ SPHINCTEROTOMY AND BALLOON DILATION    . HEMORRHOID SURGERY  2016  . RIGHT/LEFT HEART CATH AND CORONARY ANGIOGRAPHY N/A 10/29/2020   Procedure: RIGHT/LEFT HEART CATH AND CORONARY ANGIOGRAPHY;  Surgeon: [name], MD;  Location: [place] [facility];  Service: Cardiovascular;  Laterality: N/A;  . TEE WITHOUT CARDIOVERSION N/A 09/14/2020   Procedure: TRANSESOPHAGEAL ECHOCARDIOGRAM (TEE);  Surgeon: Buford Dresser, MD;  Location: [facility] ENDOSCOPY;  Service: Cardiovascular;  Laterality: N/A;    Current Medications: Current Meds  Medication Sig  . acetaminophen (TYLENOL) 650 MG CR tablet Take 650 mg by mouth every 8 (eight) hours as needed for pain.  Marland Kitchen alendronate (FOSAMAX) 70 MG tablet Take 70 mg by mouth every Sunday. Take with a full glass of water on an empty stomach.  Marland Kitchen allopurinol (ZYLOPRIM) 300 MG tablet Take 300 mg by mouth at bedtime.  Marland Kitchen apixaban (ELIQUIS) 2.5 MG TABS tablet Take 1 tablet (2.5 mg total) by mouth 2 (two) times daily.  . Biotin 5 MG CAPS Take 5 mg by mouth daily.  . Cholecalciferol (VITAMIN D3) 25 MCG (1000 UT) CAPS Take 1,000 Units by mouth daily.  . clopidogrel (PLAVIX) 75 MG tablet Take 1 tablet (75 mg total) by mouth daily.  . digoxin (LANOXIN) 0.125 MG tablet Take 0.0625 mg by mouth daily.  . furosemide (LASIX) 40 MG tablet Take 40 mg by mouth daily.  . metoprolol tartrate (LOPRESSOR) 50 MG tablet Take 50 mg by mouth 2 (two) times daily.  . mirtazapine (REMERON) 15 MG tablet Take 7.5 mg by mouth at bedtime.  . Multiple Vitamin (MULTIVITAMIN  WITH MINERALS) TABS tablet Take 1 tablet by mouth daily.  . Multiple Vitamins-Minerals (PRESERVISION AREDS 2) CAPS Take 1 capsule by mouth 2 (two) times daily.  Marland Kitchen omega-3 acid ethyl esters (LOVAZA) 1 g capsule Take 2 g by mouth 2 (two) times daily.  . pantoprazole (PROTONIX) 40 MG tablet Take 1 tablet (40 mg total) by mouth daily.  Vladimir Faster Glycol-Propyl Glycol (SYSTANE OP) Place 1 drop into both eyes 2 (two) times daily.  . potassium chloride SA (KLOR-CON) 20 MEQ tablet Take 20 mEq by mouth 2 (two) times daily.  . simvastatin (ZOCOR) 20 MG tablet Take 20 mg by mouth daily.  . Turmeric 500 MG TABS Take 500 mg by mouth daily.  . [DISCONTINUED] omeprazole (PRILOSEC) 20 MG capsule Take 20 mg by mouth daily as needed (Heartburn).     Allergies:   Propranolol, Sulfamethoxazole, Codeine, and Latex   Social History   Socioeconomic History  . Marital status: Unknown    Spouse name: Not on file  . Number of children: Not on file  .  Years of education: Not on file  . Highest education level: Not on file  Occupational History  . Not on file  Tobacco Use  . Smoking status: Never Smoker  . Smokeless tobacco: Current User    Types: Snuff  Substance and Sexual Activity  . Alcohol use: Never  . Drug use: Never  . Sexual activity: Not on file  Other Topics Concern  . Not on file  Social History Narrative  . Not on file   Social Determinants of Health   Financial Resource Strain: Not on file  Food Insecurity: Not on file  Transportation Needs: Not on file  Physical Activity: Not on file  Stress: Not on file  Social Connections: Not on file     Family History: The patient's family history includes Aneurysm in her maternal grandmother; Congestive Heart Failure in her father; Diabetes in her mother; Heart attack in her brother, father, and sister; Heart disease in her brother, father, maternal grandfather, and maternal grandmother; Hypertension in her brother and mother; Skin cancer in  her brother; Stroke in her maternal grandfather and mother.  ROS:   Please see the history of present illness.    All other systems reviewed and are negative.  EKGs/Labs/Other Studies Reviewed:    The following studies were reviewed today:  ECHOCARDIOGRAM REPORT       Patient Name:  CHARL WELLEN Date of Exam: 07/13/2020  Medical Rec #: [id number]    Height:    66.0 in  Accession #:  [id number]    Weight:    124.6 lb  Date of Birth: 1941/04/18    BSA:     1.635 m  Patient Age:  10 years     BP:      138/90 mmHg  Patient Gender: F        HR:      81 bpm.  Exam Location: [place]   Procedure: 2D Echo   Indications:  Chronic diastolic congestive heart failure ([place]) [I50.32         (ICD-10-CM)    History:    Patient has no prior history of Echocardiogram  examinations.         Mitral Valve Disease, Arrythmias:Atrial Fibrillation,         Signs/Symptoms:Hypertensive Heart Disease; Risk         Factors:Dyslipidemia.    Sonographer:  Luane School  Referring Phys: [phone] [place]    1. Left ventricular ejection fraction, by estimation, is 60 to 65%. The  left ventricle has normal function. The left ventricle has no regional  wall motion abnormalities. Left ventricular diastolic parameters are  indeterminate.  2. Right ventricular systolic function is normal. The right ventricular  size is normal. There is moderately elevated pulmonary artery systolic  pressure.  3. Left atrial size was severely dilated.  4. Consider TEE.. The mitral valve is abnormal. Moderate to severe mitral  valve regurgitation. No evidence of mitral stenosis. There is moderate  holosystolic prolapse of the middle scallop of the posterior leaflet of  the mitral valve.  5. Mild flail of posterior leaflet.  6. Tricuspid valve regurgitation is mild to moderate.  7. The aortic  valve is normal in structure. There is mild calcification  of the aortic valve. There is mild thickening of the aortic valve. Aortic  valve regurgitation is mild to moderate. No aortic stenosis is present.  8. There is mild dilatation of the ascending aorta, measuring 39 mm.  9. The  inferior vena cava is normal in size with greater than 50%  respiratory variability, suggesting right atrial pressure of 3 mmHg.   FINDINGS  Left Ventricle: Left ventricular ejection fraction, by estimation, is 60  to 65%. The left ventricle has normal function. The left ventricle has no  regional wall motion abnormalities. The left ventricular internal cavity  size was normal in size. There is  no left ventricular hypertrophy. Left ventricular diastolic parameters  are indeterminate.   Right Ventricle: The right ventricular size is normal. No increase in  right ventricular wall thickness. Right ventricular systolic function is  normal. There is moderately elevated pulmonary artery systolic pressure.  The tricuspid regurgitant velocity is  3.33 m/s, and with an assumed right atrial pressure of 3 mmHg, the  estimated right ventricular systolic pressure is 86.5 mmHg.   Left Atrium: Left atrial size was severely dilated.   Right Atrium: Right atrial size was normal in size.   Pericardium: There is no evidence of pericardial effusion.   Mitral Valve: Consider TEE. The mitral valve is abnormal. There is  moderate holosystolic prolapse of the middle scallop of the posterior  leaflet of the mitral valve. Mild flail of the middle scallop of the  posterior mitral valve leaflet. There is mild  thickening of the mitral valve leaflet(s). Moderate to severe mitral valve  regurgitation. No evidence of mitral valve stenosis.   Tricuspid Valve: The tricuspid valve is normal in structure. Tricuspid  valve regurgitation is mild to moderate. No evidence of tricuspid  stenosis.   Aortic Valve: The aortic valve is  normal in structure. There is mild  calcification of the aortic valve. There is mild thickening of the aortic  valve. Aortic valve regurgitation is mild to moderate. Aortic  regurgitation PHT measures 387 msec. No aortic  stenosis is present.   Pulmonic Valve: The pulmonic valve was normal in structure. Pulmonic valve  regurgitation is not visualized. No evidence of pulmonic stenosis.   Aorta: The aortic root is normal in size and structure. There is mild  dilatation of the ascending aorta, measuring 39 mm.   Venous: The inferior vena cava is normal in size with greater than 50%  respiratory variability, suggesting right atrial pressure of 3 mmHg.   IAS/Shunts: No atrial level shunt detected by color flow Doppler.     LEFT VENTRICLE  PLAX 2D  LVIDd:     4.90 cm   Diastology  LVIDs:     2.80 cm   LV e' medial:  4.45 cm/s  LV PW:     1.00 cm   LV E/e' medial: 33.3  LV IVS:    1.20 cm   LV e' lateral:  6.70 cm/s  LVOT diam:   1.90 cm   LV E/e' lateral: 22.1  LV SV:     63  LV SV Index:  38  LVOT Area:   2.84 cm    LV Volumes (MOD)  LV vol d, MOD A2C: 60.3 ml  LV vol d, MOD A4C: 54.4 ml  LV vol s, MOD A2C: 22.5 ml  LV vol s, MOD A4C: 24.8 ml  LV SV MOD A2C:   37.8 ml  LV SV MOD A4C:   54.4 ml  LV SV MOD BP:   35.1 ml   RIGHT VENTRICLE      IVC  RV S prime:   9.20 cm/s IVC diam: 1.90 cm  TAPSE (M-mode): 1.3 cm   LEFT ATRIUM  Index    RIGHT ATRIUM      Index  LA diam:    4.20 cm 2.57 cm/m RA Area:   17.40 cm  LA Vol (A2C):  95.8 ml 58.58 ml/m RA Volume:  44.40 ml 27.15 ml/m  LA Vol (A4C):  77.7 ml 47.51 ml/m  LA Biplane Vol: 87.9 ml 53.75 ml/m  AORTIC VALVE  LVOT Vmax:  125.00 cm/s  LVOT Vmean: 75.550 cm/s  LVOT VTI:  0.220 m  AI PHT:   387 msec    AORTA  Ao Root diam: 3.40 cm  Ao Asc diam: 3.85 cm   MITRAL VALVE        TRICUSPID VALVE  MV Area (PHT):  4.36 cm   TR Peak grad:  44.4 mmHg  MV Decel Time: 174 msec   TR Vmax:    333.00 cm/s  MV E velocity: 148.00 cm/s               SHUNTS               Systemic VTI: 0.22 m               Systemic Diam: 1.90 cm   Jenne Campus MD  Electronically signed by Jenne Campus MD  Signature Date/Time: 07/13/2020/12:06:54 PM       TRANSESOPHOGEAL ECHO REPORT       Patient Name:  Teresa Hughes Date of Exam: 09/14/2020  Medical Rec #: [id number]    Height:    66.0 in  Accession #:  [id number]    Weight:    125.0 lb  Date of Birth: 14-May-1941    BSA:     1.638 m  Patient Age:  27 years     BP:      142/82 mmHg  Patient Gender: F        HR:      64 bpm.  Exam Location: Inpatient   Procedure: Transesophageal Echo and 3D Echo   Indications:   Severe Mitral Regurgitation    History:     Patient has prior history of Echocardiogram examinations,  most          recent 07/13/2020. CHF, Stroke, Arrythmias:Atrial  Fibrillation;          Risk Factors:Dyslipidemia.    Sonographer:   Bernadene Person RDCS  Referring Phys: [id number] Richardo Priest  Diagnosing Phys: Buford Dresser MD   PROCEDURE: After discussion of the risks and benefits of a TEE, an  informed consent was obtained from the patient. The transesophogeal probe  was passed without difficulty through the esophogus of the patient.  Sedation performed by different physician.  The patient was monitored while under deep sedation. Anesthestetic  sedation was provided intravenously by Anesthesiology: 198.25mg  of  Propofol, 60mg  of Lidocaine. Image quality was good. The patient's vital  signs; including heart rate, blood pressure,  and oxygen saturation; remained stable throughout the procedure. The  patient developed no complications during the procedure.   IMPRESSIONS    1. Left  ventricular ejection fraction, by estimation, is 50 to 55%. The  left ventricle has low normal function.  2. Right ventricular systolic function is normal. The right ventricular  size is normal.  3. No left atrial/left atrial appendage thrombus was detected.  4. A small pericardial effusion is present.  5. The mitral valve is abnormal. Severe mitral valve regurgitation. There  is severe holosystolic prolapse of the middle scallop of the posterior  leaflet of the mitral valve.  6. The aortic valve is tricuspid. Aortic valve regurgitation is mild. No  aortic stenosis is present.  7. There is Moderate (Grade III) plaque involving the descending aorta.   Conclusion(s)/Recommendation(s): There is a flail P2 leaflet with  holosystolic prolapse. MR jet wraps entire atrium in an eccentric manner,  consistent with severe MR. There is prolapse of a small chordal structure  and there is also an independently  mobile chord in the subvalvular apparatus. This may be cause of flail.   FINDINGS  Left Ventricle: Left ventricular ejection fraction, by estimation, is 50  to 55%. The left ventricle has low normal function. The left ventricular  internal cavity size was normal in size.   Right Ventricle: The right ventricular size is normal. No increase in  right ventricular wall thickness. Right ventricular systolic function is  normal.   Left Atrium: Left atrial size was not assessed. No left atrial/left atrial  appendage thrombus was detected.   Right Atrium: Right atrial size was not assessed.   Pericardium: A small pericardial effusion is present.   Mitral Valve: The mitral valve is abnormal. There is severe holosystolic  prolapse of the middle scallop of the posterior leaflet of the mitral  valve. There is mild thickening of the mitral valve leaflet(s). Severe  mitral valve regurgitation. There is no  evidence of mitral valve vegetation.   Tricuspid Valve: The tricuspid valve is  normal in structure. Tricuspid  valve regurgitation is mild . No evidence of tricuspid stenosis. There is  no evidence of tricuspid valve vegetation.   Aortic Valve: The aortic valve is tricuspid. Aortic valve regurgitation is  mild. No aortic stenosis is present. There is no evidence of aortic valve  vegetation.   Pulmonic Valve: The pulmonic valve was grossly normal. Pulmonic valve  regurgitation is mild. No evidence of pulmonic stenosis. There is no  evidence of pulmonic valve vegetation.   Aorta: The aortic root and ascending aorta are structurally normal, with  no evidence of dilitation. There is moderate (Grade III) plaque involving  the descending aorta.   IAS/Shunts: No atrial level shunt detected by color flow Doppler.   Buford Dresser MD  Electronically signed by Buford Dresser MD  Signature Date/Time: 09/14/2020/6:23:50 PM       RIGHT/LEFT HEART CATH AND CORONARY ANGIOGRAPHY  Conclusion  1. Severe two-vessel coronary artery disease with severe complex stenosis of the mid RCA and severe stenosis of the proximal LAD 2. Known severe mitral regurgitation with right heart catheterization demonstrating preserved cardiac output, mild pulmonary hypertension with mean PA pressure 27, and pulmonary wedge V wave of 17 mmHg  Recommendations: Continued multidisciplinary heart team review. Recommend formal surgical consultation. I am not sure this elderly patient will be candidate for conventional surgery. Other considerations would be PCI and transcatheter edge-to-edge repair of the mitral valve versus palliative medical therapy. Heart team review with Dr Bettina Gavia and multidisciplinary valve team.   Indications  Severe mitral regurgitation [I34.0 (ICD-10-CM)]   Procedural Details  Technical Details INDICATION: Severe symptomatic mitral regurgitation  PROCEDURAL DETAILS: There was an indwelling IV in a right antecubital vein. Using normal sterile  technique, the IV was changed out for a 5 Fr brachial sheath over a 0.018 inch wire. The right wrist was then prepped, draped, and anesthetized with 1% lidocaine. Using the modified Seldinger technique a 5/6 French Slender sheath was placed in the right radial artery. Intra-arterial verapamil was administered through the radial artery sheath. IV heparin was administered after a JR4 catheter  was advanced into the central aorta. A Swan-Ganz catheter was used for the right heart catheterization. Standard protocol was followed for recording of right heart pressures and sampling of oxygen saturations. Fick cardiac output was calculated. Standard Judkins catheters were used for selective coronary angiography. LV pressure is recorded and an aortic valve pullback is performed. There were no immediate procedural complications. The patient was transferred to the post catheterization recovery area for further monitoring.    Estimated blood loss <50 mL.   During this procedure medications were administered to achieve and maintain moderate conscious sedation while the patient's heart rate, blood pressure, and oxygen saturation were continuously monitored and I was present face-to-face 100% of this time.   Medications (Filter: Administrations occurring from 0731 to 0821 on 10/29/20) (important) Continuous medications are totaled by the amount administered until 10/29/20 0821.    midazolam (VERSED) injection (mg) Total dose:  1 mg  Date/Time Rate/Dose/Volume Action   10/29/20 0749 1 mg Given    fentaNYL (SUBLIMAZE) injection (mcg) Total dose:  25 mcg  Date/Time Rate/Dose/Volume Action   10/29/20 0749 25 mcg Given    lidocaine (PF) (XYLOCAINE) 1 % injection (mL) Total volume:  4 mL  Date/Time Rate/Dose/Volume Action   10/29/20 0751 2 mL Given   0752 2 mL Given    Radial Cocktail/Verapamil only (mL) Total volume:  10 mL  Date/Time Rate/Dose/Volume Action   10/29/20 0756 10 mL  Given    Heparin (Porcine) in NaCl 1000-0.9 UT/500ML-% SOLN (mL) Total volume:  1,000 mL  Date/Time Rate/Dose/Volume Action   10/29/20 0756 500 mL Given   0756 500 mL Given    heparin sodium (porcine) injection (Units) Total dose:  3,000 Units  Date/Time Rate/Dose/Volume Action   10/29/20 0802 3,000 Units Given    iohexol (OMNIPAQUE) 350 MG/ML injection (mL) Total volume:  50 mL  Date/Time Rate/Dose/Volume Action   10/29/20 0815 50 mL Given    Sedation Time  Sedation Time Physician-1: 24 minutes 14 seconds   Contrast  Medication Name Total Dose  iohexol (OMNIPAQUE) 350 MG/ML injection 50 mL    Radiation/Fluoro  Fluoro time: 4.4 (min) DAP: 7638 (mGycm2) Cumulative Air Kerma: 158 (mGy)   Coronary Findings   Diagnostic Dominance: Right  Left Main  Vessel is large. There is mild diffuse disease throughout the vessel. The left main is patent with no significant obstruction. The vessel divides into the LAD and left circumflex.  Left Anterior Descending  Prox LAD lesion is 75% stenosed with 0% stenosed side branch in 1st Diag. The lesion is moderately calcified. The proximal LAD has a long segment 75 to 80% stenosis. There is a proximal landing zone at the ostium if stenting is to be considered. There is poststenotic dilatation of the LAD with an area of ectasia. The mid LAD is diffusely diseased after the second diagonal branch with 60 to 70% stenosis but in a small caliber mid to distal LAD.  Mid LAD lesion is 65% stenosed. The LAD is a small to medium caliber vessel throughout this segment  Left Circumflex  There is mild diffuse disease throughout the vessel. The circumflex has diffuse plaquing but no high-grade stenoses. The first OM is very small. The distal circumflex supplies 2 large obtuse marginal branches that have mild diffuse plaquing but no significant stenoses. Those vessels are tortuous.  Prox Cx lesion is 40% stenosed.  Right  Coronary Artery  The mid RCA is severely diseased. There is 80 to 90% stenosis followed by a discrete  95% stenosis then there is segmental disease at the junction of the mid/distal RCA of approximately 50 to 60%. The distal vessel is patent. The PDA and PLA branches are patent.  Prox RCA lesion is 90% stenosed. The lesion is moderately calcified.  Mid RCA-1 lesion is 95% stenosed. The lesion is focal.  Mid RCA-2 lesion is 60% stenosed. The lesion is moderately calcified.   Intervention   No interventions have been documented.  Coronary Diagrams   Diagnostic Dominance: Right    Intervention    Implants    No implant documentation for this case.    Syngo Images  Show images for CARDIAC CATHETERIZATION  Images on Long Term Storage  Show images for [place], [place] to Procedure Log  Procedure Log     Hemo Data  Flowsheet Row Most Recent Value  Fick Cardiac Output 4.13 L/min  Fick Cardiac Output Index 2.52 (L/min)/BSA  RA A Wave -99 mmHg  RA V Wave 4 mmHg  RA Mean 2 mmHg  RV Systolic Pressure 41 mmHg  RV Diastolic Pressure 0 mmHg  RV EDP 2 mmHg  PA Systolic Pressure 40 mmHg  PA Diastolic Pressure 17 mmHg  PA Mean 27 mmHg  PW A Wave -99 mmHg  PW V Wave 14 mmHg  PW Mean 11 mmHg  AO Systolic Pressure 119 mmHg  AO Diastolic Pressure 88 mmHg  AO Mean 147 mmHg  LV Systolic Pressure 829 mmHg  LV Diastolic Pressure 6 mmHg  LV EDP 22 mmHg  AOp Systolic Pressure 562 mmHg  AOp Diastolic Pressure 72 mmHg  AOp Mean Pressure 130 mmHg  LVp Systolic Pressure 865 mmHg  LVp Diastolic Pressure 5 mmHg  LVp EDP Pressure 8 mmHg  QP/QS 1  TPVR Index 10.73 HRUI  TSVR Index 46.9 HRUI  PVR SVR Ratio 0.14  TPVR/TSVR Ratio 0.23    EKG:  EKG is  ordered today.  The ekg ordered today demonstrates afib with PVCs Recent Labs: 10/20/2020: BUN 22; Creatinine, Ser 1.14; Platelets 201 10/29/2020: Hemoglobin 12.2; Potassium 3.5; Sodium 143  Recent  Lipid Panel No results found for: CHOL, TRIG, HDL, CHOLHDL, VLDL, LDLCALC, LDLDIRECT   Risk Assessment/Calculations:    CHA2DS2-VASc Score = 6  This indicates a 9.7% annual risk of stroke. The patient's score is based upon: CHF History: Yes HTN History: Yes Diabetes History: No Stroke History: No Vascular Disease History: Yes Age Score: 2 Gender Score: 1      Physical Exam:    VS:  BP 122/74   Pulse 76   Ht 5\' 6"  (1.676 m)   Wt 125 lb 6.4 oz (56.9 kg)   SpO2 91%   BMI 20.24 kg/m     Wt Readings from Last 3 Encounters:  12/16/20 125 lb 6.4 oz (56.9 kg)  11/08/20 125 lb (56.7 kg)  10/29/20 125 lb (56.7 kg)     GEN:  Well nourished, well developed in no acute distress HEENT: Normal NECK: No JVD LYMPHATICS: No lymphadenopathy CARDIAC: irreg irreg, 3/6 holosystolic murmur at apex.no rubs, gallops RESPIRATORY:  Clear to auscultation without rales, wheezing or rhonchi  ABDOMEN: Soft, non-tender, non-distended MUSCULOSKELETAL:  No edema; No deformity  SKIN: Warm and dry . Dime sized raised outpouching at site of previous radial cath. No tenderness.  NEUROLOGIC:  Alert and oriented x 3 PSYCHIATRIC:  Normal affect   ASSESSMENT:    1. Nonrheumatic mitral valve regurgitation   2. Coronary artery disease   3. Chronic atrial fibrillation (HCC)  PLAN:    In order of problems listed above:  Severe MR: plan is for PCI + TEER. First step will be to get set up for PCI of the RCA and FFR-PCI of the LAD. She has been given instructions on when to hold Eliquis and will be loaded with plavix.   The risks [stroke (1 in 1000), death (1 in 22), kidney failure [usually temporary] (1 in 500), bleeding (1 in 200), allergic reaction [possibly serious] (1 in 200)], benefits (diagnostic support and management of coronary artery disease) and alternatives of a cardiac catheterization were discussed in detail with Ms. Tuohey and she is willing to proceed.  Multivessel CAD: as above,  being set up for PCI of the RCA and FFR-PCI of the LAD with Dr. Burt Knack on 5/11. Of note, she has a small lump on her right wrist since last radial cath. She thinks it might be growing. It is non tender. There is a good radial pulse and no bruit. Will have Dr. Burt Knack examine when he brings her in for cath.   Persistent Afib: rate well controlled. Continue on Eliquis.   Medication Adjustments/Labs and Tests Ordered: Current medicines are reviewed at length with the patient today.  Concerns regarding medicines are outlined above.  Orders Placed This Encounter  Procedures  . Basic metabolic panel  . CBC with Differential/Platelet  . EKG 12-Lead   Meds ordered this encounter  Medications  . clopidogrel (PLAVIX) 75 MG tablet    Sig: Take 1 tablet (75 mg total) by mouth daily.    Dispense:  30 tablet    Refill:  11  . pantoprazole (PROTONIX) 40 MG tablet    Sig: Take 1 tablet (40 mg total) by mouth daily.    Dispense:  30 tablet    Refill:  11    Patient Instructions  Medication instructions: 1) STOP ELIQUIS 5/8 AFTER your PM dose (you will be instructed when to resume at the hospital) 2) TAKE CLOPIDOGREL (Plavix) 300 mg (4 tablets total) on 5/9 3) START PLAVIX 75 mg daily on 5/10 4) STOP OMEPRAZOLE 5) START PANTROPRAZOLE 40 mg daily   COVID SCREENING INFORMATION (5/9): You are scheduled for your drive-thru COVID screening on 5/9 between 1:30PM and 2:00PM. Pre-Procedural COVID-19 Testing Site [address] [place], [place] [address] You will need to go home after your screening and quarantine until your procedure.   CATHETERIZATION INSTRUCTIONS (5/11): You are scheduled for a Cardiac Catheterization on Wednesday, May 11 with Dr. [name].  1. Please arrive at the [facility] (Main Entrance A) at [facility]: [address] [place], [place] [address] at 8:00 AM (This time is two hours before your procedure to ensure your preparation). Free valet parking service is  available. You are allowed ONE visitor in the waiting room during your procedure. Both you and your guest must wear masks. Special note: Every effort is made to have your procedure done on time. Please understand that emergencies sometimes delay scheduled procedures.  2. Diet: Do not eat solid foods after midnight.  You may have clear liquids until 5am upon the day of the procedure.  3. Medication instructions in preparation for your procedure:  1) Take your last dose of Eliquis 5/8 (take your PM dose).   2) TAKE ASPIRIN 81 mg the morning of your cath  3) MAKE SURE TO TAKE YOUR PLAVIX the morning of your cath  4) HOLD LASIX the morning of your cath  5) You may take your  other meds as directed with a sip of water    4. Plan for one night stay--bring personal belongings. 5. Bring a current list of your medications and current insurance cards. 6. You MUST have a responsible person to drive you home. 7. Someone MUST be with you the first 24 hours after you arrive home or your discharge will be delayed. 8. Please wear clothes that are easy to get on and off and wear slip-on shoes.  Thank you for allowing [place] to care for you!   -- [facility]     Signed, Angelena Form, PA-C  12/16/2020 2:17 PM    [place]

## 2020-12-16 NOTE — Patient Instructions (Addendum)
Medication instructions: 1) STOP ELIQUIS 5/8 AFTER your PM dose (you will be instructed when to resume at the hospital) 2) TAKE CLOPIDOGREL (Plavix) 300 mg (4 tablets total) on 5/9 3) START PLAVIX 75 mg daily on 5/10 4) STOP OMEPRAZOLE 5) START PANTROPRAZOLE 40 mg daily   COVID SCREENING INFORMATION (5/9): You are scheduled for your drive-thru COVID screening on 5/9 between 1:30PM and 2:00PM. Pre-Procedural COVID-19 Testing Site [address] [place], [place] [address] You will need to go home after your screening and quarantine until your procedure.   CATHETERIZATION INSTRUCTIONS (5/11): You are scheduled for a Cardiac Catheterization on Wednesday, May 11 with Dr. [name].  1. Please arrive at the [facility] (Main Entrance A) at [facility]: [address] [place], [place] [address] at 8:00 AM (This time is two hours before your procedure to ensure your preparation). Free valet parking service is available. You are allowed ONE visitor in the waiting room during your procedure. Both you and your guest must wear masks. Special note: Every effort is made to have your procedure done on time. Please understand that emergencies sometimes delay scheduled procedures.  2. Diet: Do not eat solid foods after midnight.  You may have clear liquids until 5am upon the day of the procedure.  3. Medication instructions in preparation for your procedure:  1) Take your last dose of Eliquis 5/8 (take your PM dose).   2) TAKE ASPIRIN 81 mg the morning of your cath  3) MAKE SURE TO TAKE YOUR PLAVIX the morning of your cath  4) HOLD LASIX the morning of your cath  5) You may take your other meds as directed with a sip of water    4. Plan for one night stay--bring personal belongings. 5. Bring a current list of your medications and current insurance cards. 6. You MUST have a responsible person to drive you home. 7. Someone MUST be with you the first 24 hours after you arrive home or your  discharge will be delayed. 8. Please wear clothes that are easy to get on and off and wear slip-on shoes.  Thank you for allowing [place] to care for you!   -- [facility]

## 2020-12-16 NOTE — H&P (View-Only) (Signed)
HEART AND [place]                                     Cardiology Office Note:    Date:  12/16/2020   ID:  [name], DOB 03/10/1941, MRN [id number]  PCP:  Nicoletta Dress, MD  [facility] Cardiologist:  Dr. Harden [facility] Electrophysiologist:  None   Referring MD: Nicoletta Dress, MD   Follow up severe MR- set up heart cath   History of Present Illness:    Teresa Hughes is a 80 y.o. female with a hx of severe multivessel coronary artery disease, longstanding persistent atrial fibrillation on long-term anticoagulation, embolic stroke, chronic diastolic congestive heart failure, chronic kidney disease, degenerative arthritis with chronic back pain, esophageal stricture, GE reflux disease, hiatal hernia, macular degeneration, and MVP with mitral regurgitation who presents to clinic for follow up.   Patient states that she is been known of presence of a heart murmur since she was young.  She denies any previous history of coronary artery disease. She has history of previous stroke with some residual and history of longstanding persistent atrial fibrillation currently anticoagulated using Eliquis. She has long history of shortness of breath without chest discomfort dating back at least 5 or 6 years but which has progressed substantially over the past 6 to 12 months. Follow-up transthoracic echocardiogram performed July 13, 2020 revealed normal left ventricular systolic function with ejection fraction estimated 60 to 65%.  There was mitral valve prolapse with moderate to severe mitral regurgitation.  Transesophageal echocardiogram was recommended.  Patient underwent TEE on September 14, 2020 which confirmed the presence of mitral valve prolapse with severe mitral regurgitation.  There was low normal left ventricular function with ejection fraction estimated only 50 to 55% in the setting of severe mitral regurgitation. There was  severe holosystolic prolapse involving a portion of the middle scallop of the posterior leaflet.  There was severe left atrial enlargement.  The patient was referred to the multidisciplinary heart valve clinic and has been evaluated previously by Dr. Burt Knack who discussed the potential role of transcatheter edge-to-edge repair. Diagnostic cardiac catheterization was performed October 29, 2020.  Catheterization revealed severe multivessel coronary artery disease including long segment severe stenosis of the mid right coronary artery and 75% proximal stenosis of the left anterior descending coronary artery with mild nonobstructive disease in the left circumflex territory. Right heart pressures were very mildly elevated.   She was referred to Dr Roxy Manns for surgical evaluation. She was felt to be at very high risk for surgery and the patient was interested in a less invasive approach. It was decided to proceed with PCI and staged TEER. She was evaluated by dental and cleared for surgery.  Today she presents to clinic for follow up. Here with daughter. Still with dyspnea on exertion and fatigue. No chest pain. No LE edema, orthopnea or PND. No dizziness or syncope. No blood in stool or urine. No palpitations. Does have a small lump on right arm from previous cath. She thinks it might be growing. Not painful.    Past Medical History:  Diagnosis Date  . Acute kidney injury ([place])   . Anemia of chronic disease   . Aneurysm ([place]) 07/01/2018  . Anxiety   . Cerebral thrombosis with cerebral infarction ([place]) 05/07/2012  . CHF (congestive heart failure) ([place])   .  Chronic anticoagulation 08/28/2016  . Chronic atrial fibrillation ([place]) 08/28/2016  . Chronic insomnia   . Chronic renal disease   . Coronary artery disease   . DDD (degenerative disc disease), cervical   . Esophageal stricture   . GERD (gastroesophageal reflux disease)   . Gout   . Hiatal hernia   . High risk medication use 08/28/2016  . Hypercalcemia    . Hypertensive heart disease without heart failure 08/28/2016  . Late effects of CVA (cerebrovascular accident)   . Macular degeneration, dry   . Osteopenia   . Osteoporosis   . Other hyperlipidemia 08/28/2016  . Paroxysmal SVT (supraventricular tachycardia) ([place])   . Vitamin D deficiency     Past Surgical History:  Procedure Laterality Date  . CATARACT EXTRACTION Bilateral    Right 04/02/17, Left 9/22018  . ERCP W/ SPHINCTEROTOMY AND BALLOON DILATION    . HEMORRHOID SURGERY  2016  . RIGHT/LEFT HEART CATH AND CORONARY ANGIOGRAPHY N/A 10/29/2020   Procedure: RIGHT/LEFT HEART CATH AND CORONARY ANGIOGRAPHY;  Surgeon: [name], MD;  Location: [facility];  Service: Cardiovascular;  Laterality: N/A;  . TEE WITHOUT CARDIOVERSION N/A 09/14/2020   Procedure: TRANSESOPHAGEAL ECHOCARDIOGRAM (TEE);  Surgeon: Buford Dresser, MD;  Location[facility] ENDOS[facility];  Service: Cardiovascular;  Laterality: N/A;    Current Medications: Current Meds  Medication Sig  . acetaminophen (TYLENOL) 650 MG CR tablet Take 650 mg by mouth every 8 (eight) hours as needed for pain.  Marland Kitchen alendronate (FOSAMAX) 70 MG tablet Take 70 mg by mouth every Sunday. Take with a full glass of water on an empty stomach.  Marland Kitchen allopurinol (ZYLOPRIM) 300 MG tablet Take 300 mg by mouth at bedtime.  Marland Kitchen apixaban (ELIQUIS) 2.5 MG TABS tablet Take 1 tablet (2.5 mg total) by mouth 2 (two) times daily.  . Biotin 5 MG CAPS Take 5 mg by mouth daily.  . Cholecalciferol (VITAMIN D3) 25 MCG (1000 UT) CAPS Take 1,000 Units by mouth daily.  . clopidogrel (PLAVIX) 75 MG tablet Take 1 tablet (75 mg total) by mouth daily.  . digoxin (LANOXIN) 0.125 MG tablet Take 0.0625 mg by mouth daily.  . furosemide (LASIX) 40 MG tablet Take 40 mg by mouth daily.  . metoprolol tartrate (LOPRESSOR) 50 MG tablet Take 50 mg by mouth 2 (two) times daily.  . mirtazapine (REMERON) 15 MG tablet Take 7.5 mg by mouth at bedtime.  . Multiple Vitamin (MULTIVITAMIN  WITH MINERALS) TABS tablet Take 1 tablet by mouth daily.  . Multiple Vitamins-Minerals (PRESERVISION AREDS 2) CAPS Take 1 capsule by mouth 2 (two) times daily.  Marland Kitchen omega-3 acid ethyl esters (LOVAZA) 1 g capsule Take 2 g by mouth 2 (two) times daily.  . pantoprazole (PROTONIX) 40 MG tablet Take 1 tablet (40 mg total) by mouth daily.  Vladimir Faster Glycol-Propyl Glycol (SYSTANE OP) Place 1 drop into both eyes 2 (two) times daily.  . potassium chloride SA (KLOR-CON) 20 MEQ tablet Take 20 mEq by mouth 2 (two) times daily.  . simvastatin (ZOCOR) 20 MG tablet Take 20 mg by mouth daily.  . Turmeric 500 MG TABS Take 500 mg by mouth daily.  . [DISCONTINUED] omeprazole (PRILOSEC) 20 MG capsule Take 20 mg by mouth daily as needed (Heartburn).     Allergies:   Propranolol, Sulfamethoxazole, Codeine, and Latex   Social History   Socioeconomic History  . Marital status: Unknown    Spouse name: Not on file  . Number of children: Not on file  .  Years of education: Not on file  . Highest education level: Not on file  Occupational History  . Not on file  Tobacco Use  . Smoking status: Never Smoker  . Smokeless tobacco: Current User    Types: Snuff  Substance and Sexual Activity  . Alcohol use: Never  . Drug use: Never  . Sexual activity: Not on file  Other Topics Concern  . Not on file  Social History Narrative  . Not on file   Social Determinants of Health   Financial Resource Strain: Not on file  Food Insecurity: Not on file  Transportation Needs: Not on file  Physical Activity: Not on file  Stress: Not on file  Social Connections: Not on file     Family History: The patient's family history includes Aneurysm in her maternal grandmother; Congestive Heart Failure in her father; Diabetes in her mother; Heart attack in her brother, father, and sister; Heart disease in her brother, father, maternal grandfather, and maternal grandmother; Hypertension in her brother and mother; Skin cancer in  her brother; Stroke in her maternal grandfather and mother.  ROS:   Please see the history of present illness.    All other systems reviewed and are negative.  EKGs/Labs/Other Studies Reviewed:    The following studies were reviewed today:  ECHOCARDIOGRAM REPORT       Patient Name:  DARLEAN WARMOTH Date of Exam: 07/13/2020  Medical Rec #: [id number]    Height:    66.0 in  Accession #:  [id number]    Weight:    124.6 lb  Date of Birth: [birth year]    BSA:     1.635 m  Patient Age:  21 years     BP:      138/90 mmHg  Patient Gender: F        HR:      81 bpm.  Exam Location: [facility]   Procedure: 2D Echo   Indications:  Chronic diastolic congestive heart failure ([place]) [I50.32         (ICD-10-CM)    History:    Patient has no prior history of Echocardiogram  examinations.         Mitral Valve Disease, Arrythmias:Atrial Fibrillation,         Signs/Symptoms:Hypertensive Heart Disease; Risk         Factors:Dyslipidemia.    Sonographer:  Luane School  Referring Phys: [id number] [place]    1. Left ventricular ejection fraction, by estimation, is 60 to 65%. The  left ventricle has normal function. The left ventricle has no regional  wall motion abnormalities. Left ventricular diastolic parameters are  indeterminate.  2. Right ventricular systolic function is normal. The right ventricular  size is normal. There is moderately elevated pulmonary artery systolic  pressure.  3. Left atrial size was severely dilated.  4. Consider TEE.. The mitral valve is abnormal. Moderate to severe mitral  valve regurgitation. No evidence of mitral stenosis. There is moderate  holosystolic prolapse of the middle scallop of the posterior leaflet of  the mitral valve.  5. Mild flail of posterior leaflet.  6. Tricuspid valve regurgitation is mild to moderate.  7. The aortic  valve is normal in structure. There is mild calcification  of the aortic valve. There is mild thickening of the aortic valve. Aortic  valve regurgitation is mild to moderate. No aortic stenosis is present.  8. There is mild dilatation of the ascending aorta, measuring 39 mm.  9. The  inferior vena cava is normal in size with greater than 50%  respiratory variability, suggesting right atrial pressure of 3 mmHg.   FINDINGS  Left Ventricle: Left ventricular ejection fraction, by estimation, is 60  to 65%. The left ventricle has normal function. The left ventricle has no  regional wall motion abnormalities. The left ventricular internal cavity  size was normal in size. There is  no left ventricular hypertrophy. Left ventricular diastolic parameters  are indeterminate.   Right Ventricle: The right ventricular size is normal. No increase in  right ventricular wall thickness. Right ventricular systolic function is  normal. There is moderately elevated pulmonary artery systolic pressure.  The tricuspid regurgitant velocity is  3.33 m/s, and with an assumed right atrial pressure of 3 mmHg, the  estimated right ventricular systolic pressure is 62.3 mmHg.   Left Atrium: Left atrial size was severely dilated.   Right Atrium: Right atrial size was normal in size.   Pericardium: There is no evidence of pericardial effusion.   Mitral Valve: Consider TEE. The mitral valve is abnormal. There is  moderate holosystolic prolapse of the middle scallop of the posterior  leaflet of the mitral valve. Mild flail of the middle scallop of the  posterior mitral valve leaflet. There is mild  thickening of the mitral valve leaflet(s). Moderate to severe mitral valve  regurgitation. No evidence of mitral valve stenosis.   Tricuspid Valve: The tricuspid valve is normal in structure. Tricuspid  valve regurgitation is mild to moderate. No evidence of tricuspid  stenosis.   Aortic Valve: The aortic valve is  normal in structure. There is mild  calcification of the aortic valve. There is mild thickening of the aortic  valve. Aortic valve regurgitation is mild to moderate. Aortic  regurgitation PHT measures 387 msec. No aortic  stenosis is present.   Pulmonic Valve: The pulmonic valve was normal in structure. Pulmonic valve  regurgitation is not visualized. No evidence of pulmonic stenosis.   Aorta: The aortic root is normal in size and structure. There is mild  dilatation of the ascending aorta, measuring 39 mm.   Venous: The inferior vena cava is normal in size with greater than 50%  respiratory variability, suggesting right atrial pressure of 3 mmHg.   IAS/Shunts: No atrial level shunt detected by color flow Doppler.     LEFT VENTRICLE  PLAX 2D  LVIDd:     4.90 cm   Diastology  LVIDs:     2.80 cm   LV e' medial:  4.45 cm/s  LV PW:     1.00 cm   LV E/e' medial: 33.3  LV IVS:    1.20 cm   LV e' lateral:  6.70 cm/s  LVOT diam:   1.90 cm   LV E/e' lateral: 22.1  LV SV:     63  LV SV Index:  38  LVOT Area:   2.84 cm    LV Volumes (MOD)  LV vol d, MOD A2C: 60.3 ml  LV vol d, MOD A4C: 54.4 ml  LV vol s, MOD A2C: 22.5 ml  LV vol s, MOD A4C: 24.8 ml  LV SV MOD A2C:   37.8 ml  LV SV MOD A4C:   54.4 ml  LV SV MOD BP:   35.1 ml   RIGHT VENTRICLE      IVC  RV S prime:   9.20 cm/s IVC diam: 1.90 cm  TAPSE (M-mode): 1.3 cm   LEFT ATRIUM  Index    RIGHT ATRIUM      Index  LA diam:    4.20 cm 2.57 cm/m RA Area:   17.40 cm  LA Vol (A2C):  95.8 ml 58.58 ml/m RA Volume:  44.40 ml 27.15 ml/m  LA Vol (A4C):  77.7 ml 47.51 ml/m  LA Biplane Vol: 87.9 ml 53.75 ml/m  AORTIC VALVE  LVOT Vmax:  125.00 cm/s  LVOT Vmean: 75.550 cm/s  LVOT VTI:  0.220 m  AI PHT:   387 msec    AORTA  Ao Root diam: 3.40 cm  Ao Asc diam: 3.85 cm   MITRAL VALVE        TRICUSPID VALVE  MV Area (PHT):  4.36 cm   TR Peak grad:  44.4 mmHg  MV Decel Time: 174 msec   TR Vmax:    333.00 cm/s  MV E velocity: 148.00 cm/s               SHUNTS               Systemic VTI: 0.22 m               Systemic Diam: 1.90 cm   Jenne Campus MD  Electronically signed by Jenne Campus MD  Signature Date/Time: 07/13/2020/12:06:54 PM       TRANSESOPHOGEAL ECHO REPORT       Patient Name:  [name] Date of Exam: 09/14/2020  Medical Rec #: [id number]    Height:    66.0 in  Accession #:  [id number]    Weight:    125.0 lb  Date of Birth: Dec 24, 1940    BSA:     1.638 m  Patient Age:  89 years     BP:      142/82 mmHg  Patient Gender: F        HR:      64 bpm.  Exam Location: Inpatient   Procedure: Transesophageal Echo and 3D Echo   Indications:   Severe Mitral Regurgitation    History:     Patient has prior history of Echocardiogram examinations,  most          recent 07/13/2020. CHF, Stroke, Arrythmias:Atrial  Fibrillation;          Risk Factors:Dyslipidemia.    Sonographer:   Bernadene Person RDCS  Referring Phys: [id number] Richardo Priest  Diagnosing Phys: Buford Dresser MD   PROCEDURE: After discussion of the risks and benefits of a TEE, an  informed consent was obtained from the patient. The transesophogeal probe  was passed without difficulty through the esophogus of the patient.  Sedation performed by different physician.  The patient was monitored while under deep sedation. Anesthestetic  sedation was provided intravenously by Anesthesiology: 198.25mg  of  Propofol, 60mg  of Lidocaine. Image quality was good. The patient's vital  signs; including heart rate, blood pressure,  and oxygen saturation; remained stable throughout the procedure. The  patient developed no complications during the procedure.   IMPRESSIONS    1. Left  ventricular ejection fraction, by estimation, is 50 to 55%. The  left ventricle has low normal function.  2. Right ventricular systolic function is normal. The right ventricular  size is normal.  3. No left atrial/left atrial appendage thrombus was detected.  4. A small pericardial effusion is present.  5. The mitral valve is abnormal. Severe mitral valve regurgitation. There  is severe holosystolic prolapse of the middle scallop of the posterior  leaflet of the mitral valve.  6. The aortic valve is tricuspid. Aortic valve regurgitation is mild. No  aortic stenosis is present.  7. There is Moderate (Grade III) plaque involving the descending aorta.   Conclusion(s)/Recommendation(s): There is a flail P2 leaflet with  holosystolic prolapse. MR jet wraps entire atrium in an eccentric manner,  consistent with severe MR. There is prolapse of a small chordal structure  and there is also an independently  mobile chord in the subvalvular apparatus. This may be cause of flail.   FINDINGS  Left Ventricle: Left ventricular ejection fraction, by estimation, is 50  to 55%. The left ventricle has low normal function. The left ventricular  internal cavity size was normal in size.   Right Ventricle: The right ventricular size is normal. No increase in  right ventricular wall thickness. Right ventricular systolic function is  normal.   Left Atrium: Left atrial size was not assessed. No left atrial/left atrial  appendage thrombus was detected.   Right Atrium: Right atrial size was not assessed.   Pericardium: A small pericardial effusion is present.   Mitral Valve: The mitral valve is abnormal. There is severe holosystolic  prolapse of the middle scallop of the posterior leaflet of the mitral  valve. There is mild thickening of the mitral valve leaflet(s). Severe  mitral valve regurgitation. There is no  evidence of mitral valve vegetation.   Tricuspid Valve: The tricuspid valve is  normal in structure. Tricuspid  valve regurgitation is mild . No evidence of tricuspid stenosis. There is  no evidence of tricuspid valve vegetation.   Aortic Valve: The aortic valve is tricuspid. Aortic valve regurgitation is  mild. No aortic stenosis is present. There is no evidence of aortic valve  vegetation.   Pulmonic Valve: The pulmonic valve was grossly normal. Pulmonic valve  regurgitation is mild. No evidence of pulmonic stenosis. There is no  evidence of pulmonic valve vegetation.   Aorta: The aortic root and ascending aorta are structurally normal, with  no evidence of dilitation. There is moderate (Grade III) plaque involving  the descending aorta.   IAS/Shunts: No atrial level shunt detected by color flow Doppler.   Buford Dresser MD  Electronically signed by Buford Dresser MD  Signature Date/Time: 09/14/2020/6:23:50 PM       RIGHT/LEFT HEART CATH AND CORONARY ANGIOGRAPHY  Conclusion  1. Severe two-vessel coronary artery disease with severe complex stenosis of the mid RCA and severe stenosis of the proximal LAD 2. Known severe mitral regurgitation with right heart catheterization demonstrating preserved cardiac output, mild pulmonary hypertension with mean PA pressure 27, and pulmonary wedge V wave of 17 mmHg  Recommendations: Continued multidisciplinary heart team review. Recommend formal surgical consultation. I am not sure this elderly patient will be candidate for conventional surgery. Other considerations would be PCI and transcatheter edge-to-edge repair of the mitral valve versus palliative medical therapy. Heart team review with Dr Bettina Gavia and multidisciplinary valve team.   Indications  Severe mitral regurgitation [I34.0 (ICD-10-CM)]   Procedural Details  Technical Details INDICATION: Severe symptomatic mitral regurgitation  PROCEDURAL DETAILS: There was an indwelling IV in a right antecubital vein. Using normal sterile  technique, the IV was changed out for a 5 Fr brachial sheath over a 0.018 inch wire. The right wrist was then prepped, draped, and anesthetized with 1% lidocaine. Using the modified Seldinger technique a 5/6 French Slender sheath was placed in the right radial artery. Intra-arterial verapamil was administered through the radial artery sheath. IV heparin was administered after a JR4 catheter  was advanced into the central aorta. A Swan-Ganz catheter was used for the right heart catheterization. Standard protocol was followed for recording of right heart pressures and sampling of oxygen saturations. Fick cardiac output was calculated. Standard Judkins catheters were used for selective coronary angiography. LV pressure is recorded and an aortic valve pullback is performed. There were no immediate procedural complications. The patient was transferred to the post catheterization recovery area for further monitoring.    Estimated blood loss <50 mL.   During this procedure medications were administered to achieve and maintain moderate conscious sedation while the patient's heart rate, blood pressure, and oxygen saturation were continuously monitored and I was present face-to-face 100% of this time.   Medications (Filter: Administrations occurring from 0731 to 0821 on 10/29/20) (important) Continuous medications are totaled by the amount administered until 10/29/20 0821.    midazolam (VERSED) injection (mg) Total dose:  1 mg  Date/Time Rate/Dose/Volume Action   10/29/20 0749 1 mg Given    fentaNYL (SUBLIMAZE) injection (mcg) Total dose:  25 mcg  Date/Time Rate/Dose/Volume Action   10/29/20 0749 25 mcg Given    lidocaine (PF) (XYLOCAINE) 1 % injection (mL) Total volume:  4 mL  Date/Time Rate/Dose/Volume Action   10/29/20 0751 2 mL Given   0752 2 mL Given    Radial Cocktail/Verapamil only (mL) Total volume:  10 mL  Date/Time Rate/Dose/Volume Action   10/29/20 0756 10 mL  Given    Heparin (Porcine) in NaCl 1000-0.9 UT/500ML-% SOLN (mL) Total volume:  1,000 mL  Date/Time Rate/Dose/Volume Action   10/29/20 0756 500 mL Given   0756 500 mL Given    heparin sodium (porcine) injection (Units) Total dose:  3,000 Units  Date/Time Rate/Dose/Volume Action   10/29/20 0802 3,000 Units Given    iohexol (OMNIPAQUE) 350 MG/ML injection (mL) Total volume:  50 mL  Date/Time Rate/Dose/Volume Action   10/29/20 0815 50 mL Given    Sedation Time  Sedation Time Physician-1: 24 minutes 14 seconds   Contrast  Medication Name Total Dose  iohexol (OMNIPAQUE) 350 MG/ML injection 50 mL    Radiation/Fluoro  Fluoro time: 4.4 (min) DAP: 7638 (mGycm2) Cumulative Air Kerma: 158 (mGy)   Coronary Findings   Diagnostic Dominance: Right  Left Main  Vessel is large. There is mild diffuse disease throughout the vessel. The left main is patent with no significant obstruction. The vessel divides into the LAD and left circumflex.  Left Anterior Descending  Prox LAD lesion is 75% stenosed with 0% stenosed side branch in 1st Diag. The lesion is moderately calcified. The proximal LAD has a long segment 75 to 80% stenosis. There is a proximal landing zone at the ostium if stenting is to be considered. There is poststenotic dilatation of the LAD with an area of ectasia. The mid LAD is diffusely diseased after the second diagonal branch with 60 to 70% stenosis but in a small caliber mid to distal LAD.  Mid LAD lesion is 65% stenosed. The LAD is a small to medium caliber vessel throughout this segment  Left Circumflex  There is mild diffuse disease throughout the vessel. The circumflex has diffuse plaquing but no high-grade stenoses. The first OM is very small. The distal circumflex supplies 2 large obtuse marginal branches that have mild diffuse plaquing but no significant stenoses. Those vessels are tortuous.  Prox Cx lesion is 40% stenosed.  Right  Coronary Artery  The mid RCA is severely diseased. There is 80 to 90% stenosis followed by a discrete  95% stenosis then there is segmental disease at the junction of the mid/distal RCA of approximately 50 to 60%. The distal vessel is patent. The PDA and PLA branches are patent.  Prox RCA lesion is 90% stenosed. The lesion is moderately calcified.  Mid RCA-1 lesion is 95% stenosed. The lesion is focal.  Mid RCA-2 lesion is 60% stenosed. The lesion is moderately calcified.   Intervention   No interventions have been documented.  Coronary Diagrams   Diagnostic Dominance: Right    Intervention    Implants    No implant documentation for this case.    Syngo Images  Show images for CARDIAC CATHETERIZATION  Images on Long Term Storage  Show images for Brett, Soza to Procedure Log  Procedure Log     Hemo Data  Flowsheet Row Most Recent Value  Fick Cardiac Output 4.13 L/min  Fick Cardiac Output Index 2.52 (L/min)/BSA  RA A Wave -99 mmHg  RA V Wave 4 mmHg  RA Mean 2 mmHg  RV Systolic Pressure 41 mmHg  RV Diastolic Pressure 0 mmHg  RV EDP 2 mmHg  PA Systolic Pressure 40 mmHg  PA Diastolic Pressure 17 mmHg  PA Mean 27 mmHg  PW A Wave -99 mmHg  PW V Wave 14 mmHg  PW Mean 11 mmHg  AO Systolic Pressure 580 mmHg  AO Diastolic Pressure 88 mmHg  AO Mean 998 mmHg  LV Systolic Pressure 338 mmHg  LV Diastolic Pressure 6 mmHg  LV EDP 22 mmHg  AOp Systolic Pressure 250 mmHg  AOp Diastolic Pressure 72 mmHg  AOp Mean Pressure 539 mmHg  LVp Systolic Pressure 767 mmHg  LVp Diastolic Pressure 5 mmHg  LVp EDP Pressure 8 mmHg  QP/QS 1  TPVR Index 10.73 HRUI  TSVR Index 46.9 HRUI  PVR SVR Ratio 0.14  TPVR/TSVR Ratio 0.23    EKG:  EKG is  ordered today.  The ekg ordered today demonstrates afib with PVCs Recent Labs: 10/20/2020: BUN 22; Creatinine, Ser 1.14; Platelets 201 10/29/2020: Hemoglobin 12.2; Potassium 3.5; Sodium 143  Recent  Lipid Panel No results found for: CHOL, TRIG, HDL, CHOLHDL, VLDL, LDLCALC, LDLDIRECT   Risk Assessment/Calculations:    CHA2DS2-VASc Score = 6  This indicates a 9.7% annual risk of stroke. The patient's score is based upon: CHF History: Yes HTN History: Yes Diabetes History: No Stroke History: No Vascular Disease History: Yes Age Score: 2 Gender Score: 1      Physical Exam:    VS:  BP 122/74   Pulse 76   Ht 5\' 6"  (1.676 m)   Wt 125 lb 6.4 oz (56.9 kg)   SpO2 91%   BMI 20.24 kg/m     Wt Readings from Last 3 Encounters:  12/16/20 125 lb 6.4 oz (56.9 kg)  11/08/20 125 lb (56.7 kg)  10/29/20 125 lb (56.7 kg)     GEN:  Well nourished, well developed in no acute distress HEENT: Normal NECK: No JVD LYMPHATICS: No lymphadenopathy CARDIAC: irreg irreg, 3/6 holosystolic murmur at apex.no rubs, gallops RESPIRATORY:  Clear to auscultation without rales, wheezing or rhonchi  ABDOMEN: Soft, non-tender, non-distended MUSCULOSKELETAL:  No edema; No deformity  SKIN: Warm and dry . Dime sized raised outpouching at site of previous radial cath. No tenderness.  NEUROLOGIC:  Alert and oriented x 3 PSYCHIATRIC:  Normal affect   ASSESSMENT:    1. Nonrheumatic mitral valve regurgitation   2. Coronary artery disease   3. Chronic atrial fibrillation (HCC)  PLAN:    In order of problems listed above:  Severe MR: plan is for PCI + TEER. First step will be to get set up for PCI of the RCA and FFR-PCI of the LAD. She has been given instructions on when to hold Eliquis and will be loaded with plavix.   The risks [stroke (1 in 1000), death (1 in 89), kidney failure [usually temporary] (1 in 500), bleeding (1 in 200), allergic reaction [possibly serious] (1 in 200)], benefits (diagnostic support and management of coronary artery disease) and alternatives of a cardiac catheterization were discussed in detail with Ms. Rens and she is willing to proceed.  Multivessel CAD: as above,  being set up for PCI of the RCA and FFR-PCI of the LAD with Dr. Burt Knack on 5/11. Of note, she has a small lump on her right wrist since last radial cath. She thinks it might be growing. It is non tender. There is a good radial pulse and no bruit. Will have Dr. Burt Knack examine when he brings her in for cath.   Persistent Afib: rate well controlled. Continue on Eliquis.   Medication Adjustments/Labs and Tests Ordered: Current medicines are reviewed at length with the patient today.  Concerns regarding medicines are outlined above.  Orders Placed This Encounter  Procedures  . Basic metabolic panel  . CBC with Differential/Platelet  . EKG 12-Lead   Meds ordered this encounter  Medications  . clopidogrel (PLAVIX) 75 MG tablet    Sig: Take 1 tablet (75 mg total) by mouth daily.    Dispense:  30 tablet    Refill:  11  . pantoprazole (PROTONIX) 40 MG tablet    Sig: Take 1 tablet (40 mg total) by mouth daily.    Dispense:  30 tablet    Refill:  11    Patient Instructions  Medication instructions: 1) STOP ELIQUIS 5/8 AFTER your PM dose (you will be instructed when to resume at the hospital) 2) TAKE CLOPIDOGREL (Plavix) 300 mg (4 tablets total) on 5/9 3) START PLAVIX 75 mg daily on 5/10 4) STOP OMEPRAZOLE 5) START PANTROPRAZOLE 40 mg daily   COVID SCREENING INFORMATION (5/9): You are scheduled for your drive-thru COVID screening on 5/9 between 1:30PM and 2:00PM. Pre-Procedural COVID-19 Testing Site [address] [place], [place] [address] You will need to go home after your screening and quarantine until your procedure.   CATHETERIZATION INSTRUCTIONS (5/11): You are scheduled for a Cardiac Catheterization on Wednesday, May 11 with Dr. [name].  1. Please arrive at the [facility] (Main Entrance A) at [facility]: [address] [place], [place] [address] at 8:00 AM (This time is two hours before your procedure to ensure your preparation). Free valet parking service is  available. You are allowed ONE visitor in the waiting room during your procedure. Both you and your guest must wear masks. Special note: Every effort is made to have your procedure done on time. Please understand that emergencies sometimes delay scheduled procedures.  2. Diet: Do not eat solid foods after midnight.  You may have clear liquids until 5am upon the day of the procedure.  3. Medication instructions in preparation for your procedure:  1) Take your last dose of Eliquis 5/8 (take your PM dose).   2) TAKE ASPIRIN 81 mg the morning of your cath  3) MAKE SURE TO TAKE YOUR PLAVIX the morning of your cath  4) HOLD LASIX the morning of your cath  5) You may take your  other meds as directed with a sip of water    4. Plan for one night stay--bring personal belongings. 5. Bring a current list of your medications and current insurance cards. 6. You MUST have a responsible person to drive you home. 7. Someone MUST be with you the first 24 hours after you arrive home or your discharge will be delayed. 8. Please wear clothes that are easy to get on and off and wear slip-on shoes.  Thank you for allowing [place] to care for you!   -- [facility]     Signed, Angelena Form, PA-C  12/16/2020 2:17 PM    [place]

## 2020-12-17 LAB — BASIC METABOLIC PANEL
BUN/Creatinine Ratio: 17 (ref 12–28)
BUN: 16 mg/dL (ref 8–27)
CO2: 22 mmol/L (ref 20–29)
Calcium: 9.2 mg/dL (ref 8.7–10.3)
Chloride: 108 mmol/L — ABNORMAL HIGH (ref 96–106)
Creatinine, Ser: 0.92 mg/dL (ref 0.57–1.00)
Glucose: 151 mg/dL — ABNORMAL HIGH (ref 65–99)
Potassium: 4.3 mmol/L (ref 3.5–5.2)
Sodium: 150 mmol/L — ABNORMAL HIGH (ref 134–144)
eGFR: 62 mL/min/{1.73_m2} (ref >59)

## 2020-12-17 LAB — CBC WITH DIFFERENTIAL/PLATELET
Basophils Absolute: 0.1 10*3/uL (ref 0.0–0.2)
Basos: 1 %
EOS (ABSOLUTE): 0.1 10*3/uL (ref 0.0–0.4)
Eos: 2 %
Hematocrit: 39.3 % (ref 34.0–46.6)
Hemoglobin: 13.5 g/dL (ref 11.1–15.9)
Immature Grans (Abs): 0 10*3/uL (ref 0.0–0.1)
Immature Granulocytes: 0 %
Lymphocytes Absolute: 1.8 10*3/uL (ref 0.7–3.1)
Lymphs: 28 %
MCH: 32.1 pg (ref 26.6–33.0)
MCHC: 34.4 g/dL (ref 31.5–35.7)
MCV: 93 fL (ref 79–97)
Monocytes Absolute: 0.6 10*3/uL (ref 0.1–0.9)
Monocytes: 9 %
Neutrophils Absolute: 3.9 10*3/uL (ref 1.4–7.0)
Neutrophils: 60 %
Platelets: 180 10*3/uL (ref 150–450)
RBC: 4.21 x10E6/uL (ref 3.77–5.28)
RDW: 13.8 % (ref 11.7–15.4)
WBC: 6.5 10*3/uL (ref 3.4–10.8)

## 2020-12-17 NOTE — Progress Notes (Signed)
Spoke to pt and she verbalized changing last dose of ELiquis from 5/8 PM to 5/9 PM

## 2020-12-17 NOTE — Progress Notes (Signed)
Let's have her take last dose 5/9 pm to minimize time off eliquis

## 2020-12-20 ENCOUNTER — Other Ambulatory Visit (HOSPITAL_COMMUNITY)
Admission: RE | Admit: 2020-12-20 | Discharge: 2020-12-20 | Disposition: A | Discharge status: Home / Self Care | Payer: Medicare HMO | Source: Ambulatory Visit | Attending: Cardiovascular Disease | Admitting: Cardiovascular Disease

## 2020-12-20 ENCOUNTER — Telehealth: Payer: Self-pay | Admitting: *Deleted

## 2020-12-20 DIAGNOSIS — Z01812 Encounter for preprocedural laboratory examination: Secondary | ICD-10-CM | POA: Diagnosis not present

## 2020-12-20 DIAGNOSIS — Z20822 Contact with and (suspected) exposure to covid-19: Secondary | ICD-10-CM | POA: Insufficient documentation

## 2020-12-20 NOTE — Telephone Encounter (Signed)
Pt contacted pre-coronary stent scheduled at [facility] for: Wednesday Dec 22, 2020 10 AM Verified arrival time and place: [facility]) at: 8 AM   No solid food after midnight prior to cath, clear liquids until 5 AM day of procedure.  Hold: Eliquis-none 12/21/20 until post procedure (last dose PM 12/20/20) Lasix-AM of procedure  Except hold medications AM meds can be  taken pre-cath with sips of water including: ASA 81 mg Plavix 75 mg  Pt reports she did start Plavix today per instructions: Plavix 300 mg today, then 75 mg daily  Confirmed patient has responsible adult to drive home post procedure and be with patient first 24 hours after arriving home: yes  You are allowed ONE visitor in the waiting room during the time you are at the hospital for your procedure. Both you and your visitor must wear a mask once you enter the hospital.   Reviewed procedure/mask/visitor instructions with patient.

## 2020-12-21 LAB — SARS CORONAVIRUS 2 (TAT 6-24 HRS): SARS Coronavirus 2: NEGATIVE

## 2020-12-22 ENCOUNTER — Ambulatory Visit (HOSPITAL_COMMUNITY)
Admission: RE | Disposition: A | Discharge status: Home / Self Care | Payer: Self-pay | Source: Home / Self Care | Attending: Cardiovascular Disease

## 2020-12-22 ENCOUNTER — Other Ambulatory Visit: Payer: Self-pay

## 2020-12-22 ENCOUNTER — Encounter (HOSPITAL_COMMUNITY): Payer: Self-pay | Admitting: Cardiovascular Disease

## 2020-12-22 ENCOUNTER — Ambulatory Visit (HOSPITAL_COMMUNITY)
Admission: RE | Admit: 2020-12-22 | Discharge: 2020-12-23 | Disposition: A | Discharge status: Home / Self Care | Payer: Medicare HMO | Attending: Cardiovascular Disease | Admitting: Cardiovascular Disease

## 2020-12-22 DIAGNOSIS — Z885 Allergy status to narcotic agent status: Secondary | ICD-10-CM | POA: Diagnosis not present

## 2020-12-22 DIAGNOSIS — Z955 Presence of coronary angioplasty implant and graft: Secondary | ICD-10-CM

## 2020-12-22 DIAGNOSIS — I25118 Atherosclerotic heart disease of native coronary artery with other forms of angina pectoris: Secondary | ICD-10-CM

## 2020-12-22 DIAGNOSIS — Z91040 Latex allergy status: Secondary | ICD-10-CM | POA: Insufficient documentation

## 2020-12-22 DIAGNOSIS — Z7901 Long term (current) use of anticoagulants: Secondary | ICD-10-CM | POA: Diagnosis not present

## 2020-12-22 DIAGNOSIS — I482 Chronic atrial fibrillation, unspecified: Secondary | ICD-10-CM | POA: Diagnosis present

## 2020-12-22 DIAGNOSIS — Z888 Allergy status to other drugs, medicaments and biological substances status: Secondary | ICD-10-CM | POA: Insufficient documentation

## 2020-12-22 DIAGNOSIS — I2510 Atherosclerotic heart disease of native coronary artery without angina pectoris: Secondary | ICD-10-CM | POA: Diagnosis present

## 2020-12-22 DIAGNOSIS — Z882 Allergy status to sulfonamides status: Secondary | ICD-10-CM | POA: Diagnosis not present

## 2020-12-22 DIAGNOSIS — Z79899 Other long term (current) drug therapy: Secondary | ICD-10-CM | POA: Diagnosis not present

## 2020-12-22 DIAGNOSIS — E7849 Other hyperlipidemia: Secondary | ICD-10-CM | POA: Diagnosis present

## 2020-12-22 DIAGNOSIS — I340 Nonrheumatic mitral (valve) insufficiency: Secondary | ICD-10-CM | POA: Diagnosis not present

## 2020-12-22 DIAGNOSIS — Z7902 Long term (current) use of antithrombotics/antiplatelets: Secondary | ICD-10-CM | POA: Insufficient documentation

## 2020-12-22 DIAGNOSIS — I25119 Atherosclerotic heart disease of native coronary artery with unspecified angina pectoris: Secondary | ICD-10-CM | POA: Diagnosis present

## 2020-12-22 DIAGNOSIS — R2689 Other abnormalities of gait and mobility: Secondary | ICD-10-CM | POA: Diagnosis not present

## 2020-12-22 DIAGNOSIS — E785 Hyperlipidemia, unspecified: Secondary | ICD-10-CM | POA: Insufficient documentation

## 2020-12-22 HISTORY — PX: CORONARY STENT INTERVENTION: CATH118234

## 2020-12-22 HISTORY — PX: CORONARY ATHERECTOMY: CATH118238

## 2020-12-22 HISTORY — DX: Atherosclerotic heart disease of native coronary artery with unspecified angina pectoris: I25.119

## 2020-12-22 LAB — POCT ACTIVATED CLOTTING TIME: Activated Clotting Time: 452 seconds

## 2020-12-22 SURGERY — CORONARY STENT INTERVENTION
Anesthesia: LOCAL

## 2020-12-22 MED ORDER — FENTANYL CITRATE (PF) 100 MCG/2ML IJ SOLN
INTRAMUSCULAR | Status: DC | PRN
  Administered 2020-12-22 (×2): 25 ug via INTRAVENOUS

## 2020-12-22 MED ORDER — APIXABAN 2.5 MG PO TABS
2.5000 mg | ORAL_TABLET | Freq: Two times a day (BID) | ORAL | Status: DC
  Administered 2020-12-23: 2.5 mg via ORAL
  Filled 2020-12-22: qty 1

## 2020-12-22 MED ORDER — METOPROLOL TARTRATE 50 MG PO TABS
50.0000 mg | ORAL_TABLET | Freq: Two times a day (BID) | ORAL | Status: DC
  Administered 2020-12-22 – 2020-12-23 (×2): 50 mg via ORAL
  Filled 2020-12-22 (×2): qty 1

## 2020-12-22 MED ORDER — HEPARIN SODIUM (PORCINE) 1000 UNIT/ML IJ SOLN
INTRAMUSCULAR | Status: AC
  Filled 2020-12-22: qty 1

## 2020-12-22 MED ORDER — NITROGLYCERIN 1 MG/10 ML FOR IR/CATH LAB
INTRA_ARTERIAL | Status: AC
  Filled 2020-12-22: qty 10

## 2020-12-22 MED ORDER — ALLOPURINOL 300 MG PO TABS
300.0000 mg | ORAL_TABLET | Freq: Every day | ORAL | Status: DC
  Administered 2020-12-22: 300 mg via ORAL
  Filled 2020-12-22: qty 1

## 2020-12-22 MED ORDER — SODIUM CHLORIDE 0.9 % IV SOLN: 250.0000 mL | INTRAVENOUS | Status: DC | PRN

## 2020-12-22 MED ORDER — CLOPIDOGREL BISULFATE 75 MG PO TABS: 75.0000 mg | ORAL_TABLET | ORAL | Status: DC

## 2020-12-22 MED ORDER — NITROGLYCERIN 1 MG/10 ML FOR IR/CATH LAB
INTRA_ARTERIAL | Status: DC | PRN
  Administered 2020-12-22: 150 ug via INTRACORONARY
  Administered 2020-12-22: 200 ug via INTRACORONARY
  Administered 2020-12-22 (×2): 150 ug via INTRACORONARY

## 2020-12-22 MED ORDER — MIDAZOLAM HCL 2 MG/2ML IJ SOLN
INTRAMUSCULAR | Status: AC
  Filled 2020-12-22: qty 2

## 2020-12-22 MED ORDER — POTASSIUM CHLORIDE CRYS ER 20 MEQ PO TBCR
20.0000 meq | EXTENDED_RELEASE_TABLET | Freq: Two times a day (BID) | ORAL | Status: DC
  Administered 2020-12-22 – 2020-12-23 (×2): 20 meq via ORAL
  Filled 2020-12-22 (×2): qty 1

## 2020-12-22 MED ORDER — VERAPAMIL HCL 2.5 MG/ML IV SOLN
INTRAVENOUS | Status: AC
  Filled 2020-12-22: qty 2

## 2020-12-22 MED ORDER — SODIUM CHLORIDE 0.9% FLUSH: 3.0000 mL | INTRAVENOUS | Status: DC | PRN

## 2020-12-22 MED ORDER — SODIUM CHLORIDE 0.9 % IV SOLN
5.0000 mg/kg | INTRAVENOUS | Status: AC
  Administered 2020-12-22: 283.5 mg via INTRAVENOUS
  Filled 2020-12-22: qty 11.34

## 2020-12-22 MED ORDER — HEPARIN (PORCINE) IN NACL 1000-0.9 UT/500ML-% IV SOLN
INTRAVENOUS | Status: AC
  Filled 2020-12-22: qty 500

## 2020-12-22 MED ORDER — ONDANSETRON HCL 4 MG/2ML IJ SOLN: 4.0000 mg | Freq: Four times a day (QID) | INTRAMUSCULAR | Status: DC | PRN

## 2020-12-22 MED ORDER — IOHEXOL 350 MG/ML SOLN
INTRAVENOUS | Status: DC | PRN
  Administered 2020-12-22: 110 mL

## 2020-12-22 MED ORDER — SODIUM CHLORIDE 0.9 % WEIGHT BASED INFUSION
3.0000 mL/kg/h | INTRAVENOUS | Status: DC
  Administered 2020-12-22: 3 mL/kg/h via INTRAVENOUS

## 2020-12-22 MED ORDER — DIGOXIN 125 MCG PO TABS
0.0625 mg | ORAL_TABLET | Freq: Every day | ORAL | Status: DC
  Administered 2020-12-23: 0.0625 mg via ORAL
  Filled 2020-12-22: qty 1

## 2020-12-22 MED ORDER — SIMVASTATIN 20 MG PO TABS
20.0000 mg | ORAL_TABLET | Freq: Every day | ORAL | Status: DC
  Administered 2020-12-23: 20 mg via ORAL
  Filled 2020-12-22: qty 1

## 2020-12-22 MED ORDER — ASPIRIN 81 MG PO CHEW: 81.0000 mg | CHEWABLE_TABLET | ORAL | Status: DC

## 2020-12-22 MED ORDER — LIDOCAINE HCL (PF) 1 % IJ SOLN
INTRAMUSCULAR | Status: DC | PRN
  Administered 2020-12-22: 2 mL

## 2020-12-22 MED ORDER — ASPIRIN 81 MG PO CHEW
81.0000 mg | CHEWABLE_TABLET | Freq: Every day | ORAL | Status: DC
  Administered 2020-12-23: 81 mg via ORAL
  Filled 2020-12-22: qty 1

## 2020-12-22 MED ORDER — PANTOPRAZOLE SODIUM 40 MG PO TBEC
40.0000 mg | DELAYED_RELEASE_TABLET | Freq: Every day | ORAL | Status: DC
  Administered 2020-12-23: 40 mg via ORAL
  Filled 2020-12-22: qty 1

## 2020-12-22 MED ORDER — HYDRALAZINE HCL 20 MG/ML IJ SOLN: 10.0000 mg | INTRAMUSCULAR | Status: AC | PRN

## 2020-12-22 MED ORDER — LABETALOL HCL 5 MG/ML IV SOLN: 10.0000 mg | INTRAVENOUS | Status: AC | PRN

## 2020-12-22 MED ORDER — SODIUM CHLORIDE 0.9 % IV SOLN
INTRAVENOUS | Status: AC | PRN
  Administered 2020-12-22: 1.75 mg/kg/h via INTRAVENOUS

## 2020-12-22 MED ORDER — SODIUM CHLORIDE 0.9% FLUSH
3.0000 mL | Freq: Two times a day (BID) | INTRAVENOUS | Status: DC
  Administered 2020-12-22: 3 mL via INTRAVENOUS

## 2020-12-22 MED ORDER — SODIUM CHLORIDE 0.9% FLUSH: 3.0000 mL | Freq: Two times a day (BID) | INTRAVENOUS | Status: DC

## 2020-12-22 MED ORDER — FUROSEMIDE 40 MG PO TABS
40.0000 mg | ORAL_TABLET | Freq: Every day | ORAL | Status: DC
  Filled 2020-12-22: qty 1

## 2020-12-22 MED ORDER — MIRTAZAPINE 7.5 MG PO TABS
7.5000 mg | ORAL_TABLET | Freq: Every day | ORAL | Status: DC
  Administered 2020-12-22: 7.5 mg via ORAL
  Filled 2020-12-22 (×2): qty 1

## 2020-12-22 MED ORDER — LIDOCAINE HCL (PF) 1 % IJ SOLN
INTRAMUSCULAR | Status: AC
  Filled 2020-12-22: qty 30

## 2020-12-22 MED ORDER — ACETAMINOPHEN 325 MG PO TABS: 650.0000 mg | ORAL_TABLET | ORAL | Status: DC | PRN

## 2020-12-22 MED ORDER — CLOPIDOGREL BISULFATE 75 MG PO TABS
75.0000 mg | ORAL_TABLET | Freq: Every day | ORAL | Status: DC
  Administered 2020-12-23: 75 mg via ORAL
  Filled 2020-12-22: qty 1

## 2020-12-22 MED ORDER — SODIUM CHLORIDE 0.9 % WEIGHT BASED INFUSION: 1.0000 mL/kg/h | INTRAVENOUS | Status: DC

## 2020-12-22 MED ORDER — MIDAZOLAM HCL 2 MG/2ML IJ SOLN
INTRAMUSCULAR | Status: DC | PRN
  Administered 2020-12-22 (×2): 1 mg via INTRAVENOUS

## 2020-12-22 MED ORDER — BIVALIRUDIN TRIFLUOROACETATE 250 MG IV SOLR
INTRAVENOUS | Status: AC
  Filled 2020-12-22: qty 250

## 2020-12-22 MED ORDER — SODIUM CHLORIDE 0.9 % WEIGHT BASED INFUSION: 1.0000 mL/kg/h | INTRAVENOUS | Status: AC

## 2020-12-22 MED ORDER — BIVALIRUDIN BOLUS VIA INFUSION - CUPID
INTRAVENOUS | Status: DC | PRN
  Administered 2020-12-22: 42.525 mg via INTRAVENOUS

## 2020-12-22 MED ORDER — HEPARIN (PORCINE) IN NACL 1000-0.9 UT/500ML-% IV SOLN
INTRAVENOUS | Status: DC | PRN
  Administered 2020-12-22 (×2): 500 mL

## 2020-12-22 MED ORDER — FENTANYL CITRATE (PF) 100 MCG/2ML IJ SOLN
INTRAMUSCULAR | Status: AC
  Filled 2020-12-22: qty 2

## 2020-12-22 SURGICAL SUPPLY — 33 items
BALL SAPPHIRE NC24 2.75X22 (BALLOONS) ×2
BALLN EUPHORA RX 2.0X15 (BALLOONS) ×2
BALLN SAPPHIRE 2.5X20 (BALLOONS) ×2
BALLN ~~LOC~~ EUPHORA RX 2.25X12 (BALLOONS) ×2
BALLN ~~LOC~~ EUPHORA RX 2.25X8 (BALLOONS) ×2
BALLOON EUPHORA RX 2.0X15 (BALLOONS) ×1 IMPLANT
BALLOON SAPPHIRE 2.5X20 (BALLOONS) ×1 IMPLANT
BALLOON SAPPHIRE NC24 2.75X22 (BALLOONS) ×1 IMPLANT
BALLOON ~~LOC~~ EUPHORA RX 2.25X12 (BALLOONS) ×1 IMPLANT
BALLOON ~~LOC~~ EUPHORA RX 2.25X8 (BALLOONS) ×1 IMPLANT
CATH SHOCKWAVE 2.5X12 (CATHETERS) ×1 IMPLANT
CATH VISTA GUIDE 6FR JR4 (CATHETERS) ×2 IMPLANT
CATHETER SHOCKWAVE 2.5X12 (CATHETERS) ×2
CROWN DIAMONDBACK CLASSIC 1.25 (BURR) ×2 IMPLANT
DEVICE CLOSURE PERCLS PRGLD 6F (VASCULAR PRODUCTS) ×1 IMPLANT
GLIDESHEATH SLEND SS 6F .021 (SHEATH) ×2 IMPLANT
GUIDEWIRE INQWIRE 1.5J.035X260 (WIRE) ×2 IMPLANT
INQWIRE 1.5J .035X260CM (WIRE) ×4
KIT ENCORE 26 ADVANTAGE (KITS) ×2 IMPLANT
KIT HEART LEFT (KITS) ×2 IMPLANT
KIT MICROPUNCTURE NIT STIFF (SHEATH) ×2 IMPLANT
LUBRICANT VIPERSLIDE CORONARY (MISCELLANEOUS) ×2 IMPLANT
PACK CARDIAC CATHETERIZATION (CUSTOM PROCEDURE TRAY) ×2 IMPLANT
PERCLOSE PROGLIDE 6F (VASCULAR PRODUCTS) ×2
SHEATH PINNACLE 6F 10CM (SHEATH) ×2 IMPLANT
SHEATH PROBE COVER 6X72 (BAG) ×2 IMPLANT
STENT SYNERGY XD 2.50X32 (Permanent Stent) ×1 IMPLANT
SYNERGY XD 2.50X32 (Permanent Stent) ×2 IMPLANT
TRANSDUCER W/STOPCOCK (MISCELLANEOUS) ×2 IMPLANT
TUBING CIL FLEX 10 FLL-RA (TUBING) ×2 IMPLANT
WIRE COUGAR XT STRL 190CM (WIRE) ×2 IMPLANT
WIRE EMERALD 3MM-J .035X150CM (WIRE) ×2 IMPLANT
WIRE VIPERWIRE COR FLEX .012 (WIRE) ×2 IMPLANT

## 2020-12-22 NOTE — Interval H&P Note (Signed)
Cath Lab Visit (complete for each Cath Lab visit)  Clinical Evaluation Leading to the Procedure:   ACS: No.  Non-ACS:    Anginal Classification: CCS III  Anti-ischemic medical therapy: Minimal Therapy (1 class of medications)  Non-Invasive Test Results: No non-invasive testing performed  Prior CABG: No previous CABG  History and Physical Interval Note:  12/22/2020 10:35 AM  Teresa Hughes  has presented today for surgery, with the diagnosis of cad.  The various methods of treatment have been discussed with the patient and family. After consideration of risks, benefits and other options for treatment, the patient has consented to  Procedure(s): CORONARY STENT INTERVENTION (N/A) as a surgical intervention.  The patient's history has been reviewed, patient examined, no change in status, stable for surgery.  I have reviewed the patient's chart and labs.  Questions were answered to the patient's satisfaction.     [name]

## 2020-12-23 ENCOUNTER — Other Ambulatory Visit (HOSPITAL_COMMUNITY): Payer: Self-pay

## 2020-12-23 DIAGNOSIS — Z885 Allergy status to narcotic agent status: Secondary | ICD-10-CM | POA: Diagnosis not present

## 2020-12-23 DIAGNOSIS — Z91040 Latex allergy status: Secondary | ICD-10-CM | POA: Diagnosis not present

## 2020-12-23 DIAGNOSIS — I340 Nonrheumatic mitral (valve) insufficiency: Secondary | ICD-10-CM | POA: Diagnosis not present

## 2020-12-23 DIAGNOSIS — I2510 Atherosclerotic heart disease of native coronary artery without angina pectoris: Secondary | ICD-10-CM | POA: Diagnosis not present

## 2020-12-23 DIAGNOSIS — E785 Hyperlipidemia, unspecified: Secondary | ICD-10-CM | POA: Diagnosis not present

## 2020-12-23 DIAGNOSIS — Z7902 Long term (current) use of antithrombotics/antiplatelets: Secondary | ICD-10-CM | POA: Diagnosis not present

## 2020-12-23 DIAGNOSIS — I482 Chronic atrial fibrillation, unspecified: Secondary | ICD-10-CM | POA: Diagnosis not present

## 2020-12-23 DIAGNOSIS — I25118 Atherosclerotic heart disease of native coronary artery with other forms of angina pectoris: Secondary | ICD-10-CM | POA: Diagnosis not present

## 2020-12-23 DIAGNOSIS — Z888 Allergy status to other drugs, medicaments and biological substances status: Secondary | ICD-10-CM | POA: Diagnosis not present

## 2020-12-23 DIAGNOSIS — Z882 Allergy status to sulfonamides status: Secondary | ICD-10-CM | POA: Diagnosis not present

## 2020-12-23 DIAGNOSIS — R2689 Other abnormalities of gait and mobility: Secondary | ICD-10-CM | POA: Diagnosis not present

## 2020-12-23 LAB — CBC
HCT: 33.3 % — ABNORMAL LOW (ref 36.0–46.0)
Hemoglobin: 11.3 g/dL — ABNORMAL LOW (ref 12.0–15.0)
MCH: 32 pg (ref 26.0–34.0)
MCHC: 33.9 g/dL (ref 30.0–36.0)
MCV: 94.3 fL (ref 80.0–100.0)
Platelets: 145 10*3/uL — ABNORMAL LOW (ref 150–400)
RBC: 3.53 MIL/uL — ABNORMAL LOW (ref 3.87–5.11)
RDW: 14.2 % (ref 11.5–15.5)
WBC: 7.7 10*3/uL (ref 4.0–10.5)
nRBC: 0 % (ref 0.0–0.2)

## 2020-12-23 LAB — BASIC METABOLIC PANEL
Anion gap: 8 (ref 5–15)
BUN: 16 mg/dL (ref 8–23)
CO2: 24 mmol/L (ref 22–32)
Calcium: 8.8 mg/dL — ABNORMAL LOW (ref 8.9–10.3)
Chloride: 107 mmol/L (ref 98–111)
Creatinine, Ser: 0.92 mg/dL (ref 0.44–1.00)
GFR, Estimated: >60 mL/min (ref >60)
Glucose, Bld: 117 mg/dL — ABNORMAL HIGH (ref 70–99)
Potassium: 3.7 mmol/L (ref 3.5–5.1)
Sodium: 139 mmol/L (ref 135–145)

## 2020-12-23 MED ORDER — ASPIRIN 81 MG PO CHEW
81.0000 mg | CHEWABLE_TABLET | Freq: Every day | ORAL | 0 refills | Status: AC
  Filled 2020-12-23: qty 7, 7d supply, fill #0

## 2020-12-23 MED ORDER — ROSUVASTATIN CALCIUM 20 MG PO TABS
20.0000 mg | ORAL_TABLET | Freq: Every day | ORAL | 1 refills | Status: AC
  Filled 2020-12-23: qty 30, 30d supply, fill #0

## 2020-12-23 MED ORDER — ASPIRIN 81 MG PO CHEW
81.0000 mg | CHEWABLE_TABLET | Freq: Every day | ORAL | 0 refills | Status: DC
  Filled 2020-12-23: qty 7, 7d supply, fill #0

## 2020-12-23 MED FILL — Verapamil HCl IV Soln 2.5 MG/ML: INTRAVENOUS | Qty: 2 | Status: AC

## 2020-12-23 MED FILL — Heparin Sodium (Porcine) Inj 1000 Unit/ML: INTRAMUSCULAR | Qty: 10 | Status: AC

## 2020-12-23 NOTE — Discharge Summary (Addendum)
Discharge Summary    Patient ID: Teresa Hughes MRN: [id number]; DOB: 20-Dec-1940  Admit date: 12/22/2020 Discharge date: 12/23/2020  PCP:  Nicoletta Dress, MD   [facility] Providers Cardiologist:  Shirlee More, MD   {  Discharge Diagnoses    Principal Problem:   Coronary artery disease Active Problems:   Chronic atrial fibrillation [facility])   Other hyperlipidemia   Nonrheumatic mitral valve regurgitation   Angina concurrent with and due to arteriosclerosis of coronary artery [facility])  Diagnostic Studies/Procedures    Cath: 12/22/20  Successful atherectomy and stenting of severe, calcified, complex stenoses in the mid RCA using balloon angioplasty, orbital atherectomy, and stenting  Recommendations:  Overnight observation with plans for discharge tomorrow as long as no complications arise  Aspirin 81 mg x 1 week (patient at high bleeding risk)  Clopidogrel 75 mg x 6 months  Resume apixaban 2.5 mg twice daily tomorrow morning  Arranged transcatheter edge-to-edge repair of the mitral valve after patient seen back in follow-up as long as she is clinically stable.  Diagnostic Dominance: Right    Intervention     _____________   History of Present Illness     Teresa Hughes is a 80 y.o. female with  a hx of severe multivessel coronary artery disease,longstanding persistent atrial fibrillation on long-term anticoagulation, embolic stroke, chronic diastolic congestive heart failure, chronic kidney disease, degenerative arthritis with chronic back pain, esophageal stricture, GE reflux disease, hiatal hernia, macular degeneration, and MVP with mitral regurgitation who presented to structural heart clinic for follow up.   Patient stated that she has a known of presence of a heart murmur since she was young. She denies any previous history of coronary artery disease. She has history of previous stroke with some residual and history of longstanding persistent atrial  fibrillation currently anticoagulated using Eliquis. She has long history of shortness of breath without chest discomfort dating back at least 5 or 6 years but which has progressed substantially over the past 6 to 12 months. Follow-up transthoracic echocardiogram performed July 13, 2020 revealed normal left ventricular systolic function with ejection fraction estimated 60 to 65%. There was mitral valve prolapse with moderate to severe mitral regurgitation. Transesophageal echocardiogram was recommended. Patient underwent TEE on September 14, 2020 which confirmed the presence of mitral valve prolapse with severe mitral regurgitation.There was low normal left ventricular function with ejection fraction estimated only 50 to 55% in the setting of severe mitral regurgitation. There was severe holosystolic prolapse involving a portion of the middle scallop of the posterior leaflet. There was severe left atrial enlargement. The patient was referred to the multidisciplinary heart valve clinic and has been evaluated previously by Dr. Burt Knack who discussed the potential role of transcatheter edge-to-edge repair. Diagnostic cardiac catheterization was performed October 29, 2020.Catheterization revealed severe multivessel coronary artery disease including long segment severe stenosis of the mid right coronary artery and 75% proximal stenosis of the left anterior descending coronary artery with mild nonobstructive disease in the left circumflex territory. Right heart pressures were very mildly elevated.  She was referred to Dr Roxy Manns for surgical evaluation. She was felt to be at very high risk for surgery and the patient was interested in a less invasive approach. It was decided to proceed with PCI and staged TEER. She was evaluated by dental and cleared for surgery.  She was set up for outpatient cardiac cath.   Hospital Course     1. CAD: underwent successful arterectomy and stenting of calcified stenosis of  the mRCA with DES x1. No complications noted overnight. Plan for triple therapy with ASA/plavix/eliquis for one week, then stop ASA and continue plavix/eliquis for 6 months. Does have residual disease of 75% in the pLAD and 40% mLAD to be treated medically.  -- Worked well with cardiac rehab without recurrent chest pain -- on ASA, plavix, statin and BB  2. Severe MR: following with structural heart with plans for Mitraclip.   3. HLD: on Zocor PTA, reports being on Crestor in the past but insurance coverage required a switch. Now with medicare. She is agreeable to switch back to Crestor 20mg  daily  4. Permanent afib: stable rates during admission.  -- continue metoprolol 50mg  BID -- eliquis 2.5 mg BID resumed post cath  General: Well developed, well nourished, female appearing in no acute distress. Head: Normocephalic, atraumatic.  Neck: Supple without bruits, JVD. Lungs:  Resp regular and unlabored, CTA. Heart: irreg, irreg, S1, S2, no S3, S4, 3/6 systolic murmur; no rub. Abdomen: Soft, non-tender, non-distended with normoactive bowel sounds. No hepatomegaly. No rebound/guarding. No obvious abdominal masses. Extremities: No clubbing, cyanosis, edema. Distal pedal pulses are 2+ bilaterally. Right femoral cath site stable without bruising or hematoma. Stable small lump at prior radial cath site. No bruit. Neuro: Alert and oriented X 3. Moves all extremities spontaneously. Psych: Normal affect.  Patient was seen by Dr. Claiborne Billings and deemed stable for discharge home. Follow up in the office has been arranged. Medications sent to pharmacy of choice.   Did the patient have an acute coronary syndrome (MI, NSTEMI, STEMI, etc) this admission?:  No                               Did the patient have a percutaneous coronary intervention (stent / angioplasty)?:  Yes.     Cath/PCI Registry Performance & Quality Measures: 1. Aspirin prescribed? - Yes 2. ADP Receptor Inhibitor (Plavix/Clopidogrel,  Brilinta/Ticagrelor or Effient/Prasugrel) prescribed (includes medically managed patients)? - Yes 3. High Intensity Statin (Lipitor 40-80mg  or Crestor 20-40mg ) prescribed? - Yes 4. For EF <40%, was ACEI/ARB prescribed? - Not Applicable (EF >/= 16%) 5. For EF <40%, Aldosterone Antagonist (Spironolactone or Eplerenone) prescribed? - Not Applicable (EF >/= 01%) 6. Cardiac Rehab Phase II ordered? - Yes       _____________  Discharge Vitals Blood pressure 137/74, pulse (!) 105, temperature 98 F (36.7 C), temperature source Oral, resp. rate 16, height 5\' 6"  (1.676 m), weight 55.8 kg, SpO2 93 %.  Filed Weights   12/22/20 0836 12/23/20 0431  Weight: 56.7 kg 55.8 kg    Labs & Radiologic Studies    CBC Recent Labs    12/23/20 0315  WBC 7.7  HGB 11.3*  HCT 33.3*  MCV 94.3  PLT 093*   Basic Metabolic Panel Recent Labs    12/23/20 0315  NA 139  K 3.7  CL 107  CO2 24  GLUCOSE 117*  BUN 16  CREATININE 0.92  CALCIUM 8.8*   Liver Function Tests No results for input(s): AST, ALT, ALKPHOS, BILITOT, PROT, ALBUMIN in the last 72 hours. No results for input(s): LIPASE, AMYLASE in the last 72 hours. High Sensitivity Troponin:   No results for input(s): TROPONINIHS in the last 720 hours.  BNP Invalid input(s): POCBNP D-Dimer No results for input(s): DDIMER in the last 72 hours. Hemoglobin A1C No results for input(s): HGBA1C in the last 72 hours. Fasting Lipid Panel No results for input(s): CHOL,  HDL, LDLCALC, TRIG, CHOLHDL, LDLDIRECT in the last 72 hours. Thyroid Function Tests No results for input(s): TSH, T4TOTAL, T3FREE, THYROIDAB in the last 72 hours.  Invalid input(s): FREET3 _____________  CARDIAC CATHETERIZATION  Result Date: 12/22/2020 Successful atherectomy and stenting of severe, calcified, complex stenoses in the mid RCA using balloon angioplasty, orbital atherectomy, and stenting Recommendations:  Overnight observation with plans for discharge tomorrow as long as  no complications arise  Aspirin 81 mg x 1 week (patient at high bleeding risk)  Clopidogrel 75 mg x 6 months  Resume apixaban 2.5 mg twice daily tomorrow morning  Arranged transcatheter edge-to-edge repair of the mitral valve after patient seen back in follow-up as long as she is clinically stable.  Disposition   Pt is being discharged home today in good condition.  Follow-up Plans & Appointments     Follow-up Information    [name], MD Follow up on 01/03/2021.   Specialty: Cardiology Why: at 11am for your follow up appt Contact information: [address] [place] [address] [address] [phone]              Discharge Instructions    Diet - low sodium heart healthy   Complete by: As directed    Discharge instructions   Complete by: As directed    Groin Site Care Refer to this sheet in the next few weeks. These instructions provide you with information on caring for yourself after your procedure. Your caregiver may also give you more specific instructions. Your treatment has been planned according to current medical practices, but problems sometimes occur. Call your caregiver if you have any problems or questions after your procedure. HOME CARE INSTRUCTIONS You may shower 24 hours after the procedure. Remove the bandage (dressing) and gently wash the site with plain soap and water. Gently pat the site dry.  Do not apply powder or lotion to the site.  Do not sit in a bathtub, swimming pool, or whirlpool for 5 to 7 days.  No bending, squatting, or lifting anything over 10 pounds (4.5 kg) as directed by your caregiver.  Inspect the site at least twice daily.  Do not drive home if you are discharged the same day of the procedure. Have someone else drive you.  You may drive 24 hours after the procedure unless otherwise instructed by your caregiver.  What to expect: Any bruising will usually fade within 1 to 2 weeks.  Blood that collects in the tissue  (hematoma) may be painful to the touch. It should usually decrease in size and tenderness within 1 to 2 weeks.  SEEK IMMEDIATE MEDICAL CARE IF: You have unusual pain at the groin site or down the affected leg.  You have redness, warmth, swelling, or pain at the groin site.  You have drainage (other than a small amount of blood on the dressing).  You have chills.  You have a fever or persistent symptoms for more than 72 hours.  You have a fever and your symptoms suddenly get worse.  Your leg becomes pale, cool, tingly, or numb.  You have heavy bleeding from the site. Hold pressure on the site. Marland Kitchen  PLEASE DO NOT MISS ANY DOSES OF YOUR PLAVIX!!!!! Also keep a log of you blood pressures and bring back to your follow up appt. Please call the office with any questions.   Patients taking blood thinners should generally stay away from medicines like ibuprofen, Advil, Motrin, naproxen, and Aleve due to risk of stomach bleeding. You  may take Tylenol as directed or talk to your primary doctor about alternatives.   PLEASE ENSURE THAT YOU DO NOT RUN OUT OF YOUR PLAVIX. This medication is very important to remain on for at least one year. IF you have issues obtaining this medication due to cost please CALL the office 3-5 business days prior to running out in order to prevent missing doses of this medication.   Increase activity slowly   Complete by: As directed       Discharge Medications   Allergies as of 12/23/2020      Reactions   Propranolol Other (See Comments)   Bradycardia   Sulfamethoxazole Swelling   Codeine Nausea And Vomiting   Latex Itching, Rash      Medication List    STOP taking these medications   simvastatin 20 MG tablet Commonly known as: ZOCOR     TAKE these medications   acetaminophen 650 MG CR tablet Commonly known as: TYLENOL Take 650 mg by mouth every 8 (eight) hours as needed for pain.   alendronate 70 MG tablet Commonly known as: FOSAMAX Take 70 mg by mouth  every Sunday. Take with a full glass of water on an empty stomach.   allopurinol 300 MG tablet Commonly known as: ZYLOPRIM Take 300 mg by mouth at bedtime.   apixaban 2.5 MG Tabs tablet Commonly known as: ELIQUIS Take 1 tablet (2.5 mg total) by mouth 2 (two) times daily.   aspirin 81 MG chewable tablet Chew 1 tablet (81 mg total) by mouth daily.   Biotin 5 MG Caps Take 5 mg by mouth daily.   clopidogrel 75 MG tablet Commonly known as: PLAVIX Take 1 tablet (75 mg total) by mouth daily.   digoxin 0.125 MG tablet Commonly known as: LANOXIN Take 0.0625 mg by mouth daily.   furosemide 40 MG tablet Commonly known as: LASIX Take 40 mg by mouth daily.   metoprolol tartrate 50 MG tablet Commonly known as: LOPRESSOR Take 50 mg by mouth 2 (two) times daily.   mirtazapine 15 MG tablet Commonly known as: REMERON Take 7.5 mg by mouth at bedtime.   multivitamin with minerals Tabs tablet Take 1 tablet by mouth daily.   omega-3 acid ethyl esters 1 g capsule Commonly known as: LOVAZA Take 2 g by mouth 2 (two) times daily.   pantoprazole 40 MG tablet Commonly known as: PROTONIX Take 1 tablet (40 mg total) by mouth daily.   potassium chloride SA 20 MEQ tablet Commonly known as: KLOR-CON Take 20 mEq by mouth 2 (two) times daily.   PreserVision AREDS 2 Caps Take 1 capsule by mouth 2 (two) times daily.   rosuvastatin 20 MG tablet Commonly known as: Crestor Take 1 tablet (20 mg total) by mouth daily.   SYSTANE OP Place 1 drop into both eyes 2 (two) times daily.   Turmeric 500 MG Tabs Take 500 mg by mouth daily.   Vitamin D3 25 MCG (1000 UT) Caps Take 1,000 Units by mouth daily.        Outstanding Labs/Studies   FLP/LFTs in 8 weeks  Duration of Discharge Encounter   Greater than 30 minutes including physician time.  Signed, Reino Bellis, NP 12/23/2020, 9:37 AM    Patient seen and examined. Agree with assessment and plan.  Patient feels well.  No chest pain.   Right groin catheterization site is stable.  Angiograms reviewed.  Excellent result with orbital atherectomy, PTCA and stenting of her calcified RCA.  Resume Eliquis today, aspirin  for 1 week and continue Plavix 75 mg for 6 months.  Patient will follow up with Dr. Burt Knack with subsequent transcatheter edge-to-edge repair of the mitral valve following his office evaluation.  DC today.   Troy Sine, MD, [facility] 12/23/2020 9:48 AM

## 2020-12-23 NOTE — Evaluation (Signed)
Physical Therapy Evaluation Patient Details Name: Teresa Hughes MRN: [id number] DOB: Aug 02, 1941 Today's Date: 12/23/2020   History of Present Illness  Pt is an 80 y/o female admitted secondary to severe MR. Pt is s/p cath with stent placement. To follow up outpatient for mitral clip. PMH includes CAD, a fib, CVA, CHF, and CKD.  Clinical Impression  Patient evaluated by Physical Therapy with no further acute PT needs identified. All education has been completed and the patient has no further questions. Pt overall independent with mobility tasks. Pre-TAVR assessment performed and information in separate note. Pt reports family can assist if needed.  See below for any follow-up Physical Therapy or equipment needs. PT is signing off. Thank you for this referral. If needs change, please re-consult.      Follow Up Recommendations No PT follow up    Equipment Recommendations  None recommended by PT    Recommendations for Other Services       Precautions / Restrictions Precautions Precautions: None Restrictions Weight Bearing Restrictions: No      Mobility  Bed Mobility               General bed mobility comments: Sitting EOB upon entry    Transfers Overall transfer level: Independent                  Ambulation/Gait Ambulation/Gait assistance: Independent Gait Distance (Feet): 950 Feet Assistive device: None Gait Pattern/deviations: WFL(Within Functional Limits) Gait velocity: WFL   General Gait Details: Overall steady gait. No LOB noted. Was able to perform 6 minute walk test and 5 meter walk test.  Stairs            Wheelchair Mobility    Modified Rankin (Stroke Patients Only)       Balance Overall balance assessment: No apparent balance deficits (not formally assessed)                                           Pertinent Vitals/Pain Pain Assessment: No/denies pain    Home Living Family/patient expects to be discharged  to:: Private residence Living Arrangements: Alone Available Help at Discharge: Family;Available PRN/intermittently Type of Home: House Home Access: Level entry     Home Layout: One level Home Equipment: Shower seat      Prior Function Level of Independence: Independent               Hand Dominance        Extremity/Trunk Assessment   Upper Extremity Assessment Upper Extremity Assessment: Overall WFL for tasks assessed    Lower Extremity Assessment Lower Extremity Assessment: Overall WFL for tasks assessed    Cervical / Trunk Assessment Cervical / Trunk Assessment: Normal  Communication   Communication: No difficulties  Cognition Arousal/Alertness: Awake/alert Behavior During Therapy: WFL for tasks assessed/performed Overall Cognitive Status: Within Functional Limits for tasks assessed                                        General Comments General comments (skin integrity, edema, etc.): Has L facial droop at baseline    Exercises     Assessment/Plan    PT Assessment Patent does not need any further PT services  PT Problem List         PT Treatment Interventions  PT Goals (Current goals can be found in the Care Plan section)  Acute Rehab PT Goals Patient Stated Goal: to go home PT Goal Formulation: With patient Time For Goal Achievement: 12/23/20 Potential to Achieve Goals: Good    Frequency     Barriers to discharge        Co-evaluation               AM-PAC PT "6 Clicks" Mobility  Outcome Measure Help needed turning from your back to your side while in a flat bed without using bedrails?: None Help needed moving from lying on your back to sitting on the side of a flat bed without using bedrails?: None Help needed moving to and from a bed to a chair (including a wheelchair)?: None Help needed standing up from a chair using your arms (e.g., wheelchair or bedside chair)?: None Help needed to walk in hospital room?:  None Help needed climbing 3-5 steps with a railing? : A Little 6 Click Score: 23    End of Session Equipment Utilized During Treatment: Gait belt Activity Tolerance: Patient tolerated treatment well Patient left: Other (comment) (in [facility] with volunteer staff for d/c  home) Nurse Communication: Mobility status PT Visit Diagnosis: Other abnormalities of gait and mobility (R26.89)    Time: 1115-5208 PT Time Calculation (min) (ACUTE ONLY): 24 min   Charges:   PT Evaluation $PT Eval Low Complexity: 1 Low PT Treatments $Gait Training: 8-22 mins        Teresa Hughes, DPT  Acute Rehabilitation Services  Pager: [phone] Office: [phone]   Teresa Hughes 12/23/2020, 12:07 PM

## 2020-12-23 NOTE — Progress Notes (Signed)
CARDIAC REHAB PHASE I   PRE:  Rate/Rhythm: 87 afib    BP: sitting 141/70    SaO2: 94 RA  MODE:  Ambulation: 280 ft   POST:  Rate/Rhythm: 117 afib    BP: sitting 150/90     SaO2: 94 RA  Pt ambulated without CP, some SOB but sts it is better. Did not want to walk far, tired after walk, to recliner. Discussed need for Plavix, restrictions, walking as tolerated (but not to push), and CRPII. Pt receptive. Gave heart healthy diet. Will refer to [facility] for after mitraclip. [place], ACSM 12/23/2020 9:57 AM

## 2020-12-23 NOTE — Progress Notes (Signed)
12/23/2020 PT TAVR Pre-Assessment  HPI:Pt is an 80 y/o female admitted secondary to severe MR. Pt is s/p cath with stent placement. To follow up outpatient for mitral clip. PMH includes CAD, a fib, CVA, CHF, and CKD.    Clinical Impression Statement: Pt is a 80 y.o.female being assessed for pre-TAVR.  Pt reports symptoms of SOB, fatigue with activity.  Pt has 5/5 strength, normal ROM, and good balance.  Pt ambulated 91ft during the 6 minute walk test requiring 0 rest breaks with max HR of 100, lowest O2 sat 96, BP 160/84 during mobility.  5 meter walk test produced an average gait speed of 3.47 ft/sec which indicates low fall risk.  RPE was 9 and dyspnea was 2 during mobility.   Pt's frailty rating was 3 which is considered managing well.  Pt would not benefit from continued PT in the acute care setting due to the above listed deficits in balance, strength, ROM, endurance and activity tolerance.    General UE/LE Strength and ROM:  Strength (0-5/5) ROM (limited/full)  R UE  5/5 full  L UE 5/5 full  R LE 5/5 full  L LE 5/5 full    6 Minute Walk Test:   Total Distance Walked:906 ft.    Did the pt need a rest break? No If yes, why? Pain:No; Fatigue:No; Dyspnea/O2 saturations: No Comments: Pt with mild SOB, but did not require rest breaks during test. Overall tolerated well.     Pre-Test Post-Test  BP 138/73 160/84  HR 72 100  O2 saturations (indicated RA or L/min [place]) 98 97  Modified Borg Dyspnea Scale (0 none-10 maximal) 1 2  RPE (6 very light-10 very hard) 7 9  Comments:    5 Meter Walk Test:  Trial 1 4.8 seconds  Trial 2 4.7 seconds  Trial 3 4.7 seconds  3 Trial Average/Gait Speed 4.73 seconds/3.47 ft/sec (<1.8 ft/sec indicates high fall risk)  Comments: Pt considered low fall risk given current gait speed.  Clinical Frailty Scale (1 very fit - 9 terminally ill): 3 (>/= 3/9 is considered frail)   Reuel Derby, PT, DPT  Acute Rehabilitation Services  Pager: [phone] Office: [phone]

## 2020-12-23 NOTE — Progress Notes (Signed)
PT Cancellation Note  Patient Details Name: SIBLEY ROLISON MRN: [id number] DOB: February 11, 1941   Cancelled Treatment:    Reason Eval/Treat Not Completed: Other (comment) Pt currently eating breakfast. Will follow up as schedule allows.   [name], DPT  Acute Rehabilitation Services  Pager: [phone] Office: [phone]    [name] 12/23/2020, 9:29 AM

## 2020-12-23 NOTE — Discharge Instructions (Addendum)
Femoral Site Care  This sheet gives you information about how to care for yourself after your procedure. Your health care provider may also give you more specific instructions. If you have problems or questions, contact your health care provider. What can I expect after the procedure? After the procedure, it is common to have:  Bruising that usually fades within 1-2 weeks.  Tenderness at the site. Follow these instructions at home: Wound care  Follow instructions from your health care provider about how to take care of your insertion site. Make sure you: ? Wash your hands with soap and water before you change your bandage (dressing). If soap and water are not available, use hand sanitizer. ? Change your dressing as told by your health care provider. ? Leave stitches (sutures), skin glue, or adhesive strips in place. These skin closures may need to stay in place for 2 weeks or longer. If adhesive strip edges start to loosen and curl up, you may trim the loose edges. Do not remove adhesive strips completely unless your health care provider tells you to do that.  Do not take baths, swim, or use a hot tub until your health care provider approves.  You may shower 24-48 hours after the procedure or as told by your health care provider. ? Gently wash the site with plain soap and water. ? Pat the area dry with a clean towel. ? Do not rub the site. This may cause bleeding.  Do not apply powder or lotion to the site. Keep the site clean and dry.  Check your femoral site every day for signs of infection. Check for: ? Redness, swelling, or pain. ? Fluid or blood. ? Warmth. ? Pus or a bad smell. Activity  For the first 2-3 days after your procedure, or as long as directed: ? Avoid climbing stairs as much as possible. ? Do not squat.  Do not lift anything that is heavier than 10 lb (4.5 kg), or the limit that you are told, until your health care provider says that it is safe.  Rest as  directed. ? Avoid sitting for a long time without moving. Get up to take short walks every 1-2 hours.  Do not drive for 24 hours if you were given a medicine to help you relax (sedative). General instructions  Take over-the-counter and prescription medicines only as told by your health care provider.  Keep all follow-up visits as told by your health care provider. This is important. Contact a health care provider if you have:  A fever or chills.  You have redness, swelling, or pain around your insertion site. Get help right away if:  The catheter insertion area swells very fast.  You pass out.  You suddenly start to sweat or your skin gets clammy.  The catheter insertion area is bleeding, and the bleeding does not stop when you hold steady pressure on the area.  The area near or just beyond the catheter insertion site becomes pale, cool, tingly, or numb. These symptoms may represent a serious problem that is an emergency. Do not wait to see if the symptoms will go away. Get medical help right away. Call your local emergency services (911 in the [place] Do not drive yourself to the hospital. Summary  After the procedure, it is common to have bruising that usually fades within 1-2 weeks.  Check your femoral site every day for signs of infection.  Do not lift anything that is heavier than 10 lb (4.5 kg), or   the limit that you are told, until your health care provider says that it is safe. This information is not intended to replace advice given to you by your health care provider. Make sure you discuss any questions you have with your health care provider. Document Revised: 04/02/2020 Document Reviewed: 04/02/2020 Elsevier Patient Education  2021 [place]. De-escalation of Triple Therapy Post-PCI  Underwent cardiac catheterization with placement of drug-eluting stent on 12/22/20. Plan for triple therapy with aspirin 81 mg and clopidogrel (Plavix) in addition to oral  anticoagulation using apixaban (Eliquis). Plan to discontinue aspirin on 12/29/20.

## 2020-12-29 ENCOUNTER — Telehealth (HOSPITAL_COMMUNITY): Payer: Self-pay

## 2020-12-29 NOTE — Telephone Encounter (Signed)
Per phase I cardiac rehab, fax cardiac rehab referral to [facility]. °

## 2020-12-30 DIAGNOSIS — E785 Hyperlipidemia, unspecified: Secondary | ICD-10-CM | POA: Diagnosis not present

## 2020-12-30 DIAGNOSIS — I480 Paroxysmal atrial fibrillation: Secondary | ICD-10-CM | POA: Diagnosis not present

## 2020-12-30 DIAGNOSIS — I340 Nonrheumatic mitral (valve) insufficiency: Secondary | ICD-10-CM | POA: Diagnosis not present

## 2020-12-30 DIAGNOSIS — I2510 Atherosclerotic heart disease of native coronary artery without angina pectoris: Secondary | ICD-10-CM | POA: Diagnosis not present

## 2021-01-03 ENCOUNTER — Ambulatory Visit: Payer: Medicare HMO | Admitting: Cardiovascular Disease

## 2021-01-03 ENCOUNTER — Encounter: Payer: Self-pay | Admitting: Cardiovascular Disease

## 2021-01-03 ENCOUNTER — Other Ambulatory Visit: Payer: Self-pay

## 2021-01-03 VITALS — BP 124/62 | HR 85 | Ht 66.0 in | Wt 122.8 lb

## 2021-01-03 DIAGNOSIS — I340 Nonrheumatic mitral (valve) insufficiency: Secondary | ICD-10-CM

## 2021-01-03 NOTE — Patient Instructions (Signed)
Lauren, the Structural Heart Nurse Navigator, will call you with future appointment times.

## 2021-01-03 NOTE — Progress Notes (Signed)
Cardiology Office Note:    Date:  01/03/2021   ID:  DELANIE TIRRELL, DOB 1940/12/03, MRN [id number]  PCP:  Nicoletta Dress, MD   [facility] Providers Cardiologist:  Shirlee More, MD     Referring MD: Nicoletta Dress, MD   Chief Complaint  Patient presents with  . Shortness of Breath    History of Present Illness:    Teresa Hughes is a 80 y.o. female with a hx of mitral regurgitation, presenting for follow-up evaluation.  The patient has longstanding persistent atrial fibrillation and chronic diastolic heart failure.  She has developed severe symptomatic mitral regurgitation and she underwent multidisciplinary review with formal cardiac surgical consultation after undergoing initial consultation with me October 20, 2020.  The patient underwent cardiac catheterization demonstrating critical stenosis of the right coronary artery and moderately severe stenosis of the proximal LAD.  She underwent complex atherectomy, PCI, and stenting of the RCA Dec 22, 2020 with recommendations for ongoing medical therapy of the LAD and the other residual CAD.  When she was seen by Dr. Roxy Manns with cardiac surgery, it was felt she would be at very high risk of conventional surgical mitral valve repair along with CABG.  This was in light of her advanced age and multiple comorbid medical conditions.  The patient presents today for follow-up evaluation.  The patient has had modest improvement since undergoing PCI.  She remains short of breath with activity.  She continues to have fatigue.  She has not had recent problems with orthopnea or PND.  The patient is short of breath with walking less than 1 block at a normal pace.  She denies lightheadedness, heart palpitations, or syncope.  She admits to easy bruising.  Otherwise no specific complaints today.  Past Medical History:  Diagnosis Date  . Acute kidney injury ([place])   . Anemia of chronic disease   . Aneurysm ([place]) 07/01/2018  . Anxiety   . Cerebral  thrombosis with cerebral infarction ([place]) 05/07/2012  . CHF (congestive heart failure) ([place])   . Chronic anticoagulation 08/28/2016  . Chronic atrial fibrillation ([place]) 08/28/2016  . Chronic insomnia   . Chronic renal disease   . Coronary artery disease   . DDD (degenerative disc disease), cervical   . Esophageal stricture   . GERD (gastroesophageal reflux disease)   . Gout   . Hiatal hernia   . High risk medication use 08/28/2016  . Hypercalcemia   . Hypertensive heart disease without heart failure 08/28/2016  . Late effects of CVA (cerebrovascular accident)   . Macular degeneration, dry   . Osteopenia   . Osteoporosis   . Other hyperlipidemia 08/28/2016  . Paroxysmal SVT (supraventricular tachycardia) ([place])   . Vitamin D deficiency     Past Surgical History:  Procedure Laterality Date  . CATARACT EXTRACTION Bilateral    Right 04/02/17, Left 9/22018  . CORONARY ATHERECTOMY N/A 12/22/2020   Procedure: CORONARY ATHERECTOMY;  Surgeon: [name], MD;  Location: [place] [facility];  Service: Cardiovascular;  Laterality: N/A;  . CORONARY STENT INTERVENTION N/A 12/22/2020   Procedure: CORONARY STENT INTERVENTION;  Surgeon: [name], MD;  Location: [place] [facility];  Service: Cardiovascular;  Laterality: N/A;  . ERCP W/ SPHINCTEROTOMY AND BALLOON DILATION    . HEMORRHOID SURGERY  2016  . RIGHT/LEFT HEART CATH AND CORONARY ANGIOGRAPHY N/A 10/29/2020   Procedure: RIGHT/LEFT HEART CATH AND CORONARY ANGIOGRAPHY;  Surgeon: [name], MD;  Location: [place] [facility];  Service: Cardiovascular;  Laterality: N/A;  . TEE WITHOUT CARDIOVERSION N/A 09/14/2020   Procedure: TRANSESOPHAGEAL ECHOCARDIOGRAM (TEE);  Surgeon: Buford Dresser, MD;  Location: [facility];  Service: Cardiovascular;  Laterality: N/A;    Current Medications: Current Meds  Medication Sig  . acetaminophen (TYLENOL) 650 MG CR tablet Take 650 mg by mouth every 8 (eight) hours as needed for pain.  Marland Kitchen  alendronate (FOSAMAX) 70 MG tablet Take 70 mg by mouth every Sunday. Take with a full glass of water on an empty stomach.  Marland Kitchen allopurinol (ZYLOPRIM) 300 MG tablet Take 300 mg by mouth at bedtime.  Marland Kitchen apixaban (ELIQUIS) 2.5 MG TABS tablet Take 1 tablet (2.5 mg total) by mouth 2 (two) times daily.  . Biotin 5 MG CAPS Take 5 mg by mouth daily.  . Cholecalciferol (VITAMIN D3) 25 MCG (1000 UT) CAPS Take 1,000 Units by mouth daily.  . clopidogrel (PLAVIX) 75 MG tablet Take 1 tablet (75 mg total) by mouth daily.  . digoxin (LANOXIN) 0.125 MG tablet Take 0.0625 mg by mouth daily.  . furosemide (LASIX) 40 MG tablet Take 40 mg by mouth daily.  . metoprolol tartrate (LOPRESSOR) 50 MG tablet Take 50 mg by mouth 2 (two) times daily.  . mirtazapine (REMERON) 15 MG tablet Take 7.5 mg by mouth at bedtime.  . Multiple Vitamin (MULTIVITAMIN WITH MINERALS) TABS tablet Take 1 tablet by mouth daily.  . Multiple Vitamins-Minerals (PRESERVISION AREDS 2) CAPS Take 1 capsule by mouth 2 (two) times daily.  Marland Kitchen omega-3 acid ethyl esters (LOVAZA) 1 g capsule Take 2 g by mouth 2 (two) times daily.  . pantoprazole (PROTONIX) 40 MG tablet Take 1 tablet (40 mg total) by mouth daily.  Vladimir Faster Glycol-Propyl Glycol (SYSTANE OP) Place 1 drop into both eyes 2 (two) times daily.  . potassium chloride SA (KLOR-CON) 20 MEQ tablet Take 20 mEq by mouth 2 (two) times daily.  . rosuvastatin (CRESTOR) 20 MG tablet Take 1 tablet (20 mg total) by mouth daily.  . Turmeric 500 MG TABS Take 500 mg by mouth daily.     Allergies:   Propranolol, Sulfamethoxazole, Codeine, and Latex   Social History   Socioeconomic History  . Marital status: Unknown    Spouse name: Not on file  . Number of children: Not on file  . Years of education: Not on file  . Highest education level: Not on file  Occupational History  . Not on file  Tobacco Use  . Smoking status: Never Smoker  . Smokeless tobacco: Current User    Types: Snuff  Substance and  Sexual Activity  . Alcohol use: Never  . Drug use: Never  . Sexual activity: Not on file  Other Topics Concern  . Not on file  Social History Narrative  . Not on file   Social Determinants of Health   Financial Resource Strain: Not on file  Food Insecurity: Not on file  Transportation Needs: Not on file  Physical Activity: Not on file  Stress: Not on file  Social Connections: Not on file     Family History: The patient's family history includes Aneurysm in her maternal grandmother; Congestive Heart Failure in her father; Diabetes in her mother; Heart attack in her brother, father, and sister; Heart disease in her brother, father, maternal grandfather, and maternal grandmother; Hypertension in her brother and mother; Skin cancer in her brother; Stroke in her maternal grandfather and mother.  ROS:   Please see the history of present illness.    All other  systems reviewed and are negative.  EKGs/Labs/Other Studies Reviewed:    The following studies were reviewed today: PCI 01-06-2021: Conclusion  Successful atherectomy and stenting of severe, calcified, complex stenoses in the mid RCA using balloon angioplasty, orbital atherectomy, and stenting  Recommendations:  Overnight observation with plans for discharge tomorrow as long as no complications arise  Aspirin 81 mg x 1 week (patient at high bleeding risk)  Clopidogrel 75 mg x 6 months  Resume apixaban 2.5 mg twice daily tomorrow morning  Arranged transcatheter edge-to-edge repair of the mitral valve after patient seen back in follow-up as long as she is clinically stable.  TEE 09/14/2020: IMPRESSIONS    1. Left ventricular ejection fraction, by estimation, is 50 to 55%. The  left ventricle has low normal function.  2. Right ventricular systolic function is normal. The right ventricular  size is normal.  3. No left atrial/left atrial appendage thrombus was detected.  4. A small pericardial effusion is present.   5. The mitral valve is abnormal. Severe mitral valve regurgitation. There  is severe holosystolic prolapse of the middle scallop of the posterior  leaflet of the mitral valve.  6. The aortic valve is tricuspid. Aortic valve regurgitation is mild. No  aortic stenosis is present.  7. There is Moderate (Grade III) plaque involving the descending aorta.   Conclusion(s)/Recommendation(s): There is a flail P2 leaflet with  holosystolic prolapse. MR jet wraps entire atrium in an eccentric manner,  consistent with severe MR. There is prolapse of a small chordal structure  and there is also an independently  mobile chord in the subvalvular apparatus. This may be cause of flail.  Cardiac Cath 10/29/2020: Conclusion  1.  Severe two-vessel coronary artery disease with severe complex stenosis of the mid RCA and severe stenosis of the proximal LAD 2.  Known severe mitral regurgitation with right heart catheterization demonstrating preserved cardiac output, mild pulmonary hypertension with mean PA pressure 27, and pulmonary wedge V wave of 17 mmHg  Recommendations: Continued multidisciplinary heart team review.  Recommend formal surgical consultation.  I am not sure this elderly patient will be candidate for conventional surgery.  Other considerations would be PCI and transcatheter edge-to-edge repair of the mitral valve versus palliative medical therapy. Heart team review with Dr Bettina Gavia and multidisciplinary valve team.   EKG:  EKG is not ordered today.  The ekg ordered 12/23/2020 demonstrates atrial fibrillation, heart rate 84 bpm, ST and T wave changes consider lateral ischemia  Recent Labs: 12/23/2020: BUN 16; Creatinine, Ser 0.92; Hemoglobin 11.3; Platelets 145; Potassium 3.7; Sodium 139  Recent Lipid Panel No results found for: CHOL, TRIG, HDL, CHOLHDL, VLDL, LDLCALC, LDLDIRECT   Risk Assessment/Calculations:    CHA2DS2-VASc Score = 6  This indicates a 9.7% annual risk of stroke. The  patient's score is based upon: CHF History: Yes HTN History: Yes Diabetes History: No Stroke History: No Vascular Disease History: Yes Age Score: 2 Gender Score: 1      Physical Exam:    VS:  BP 124/62   Pulse 85   Ht 5\' 6"  (1.676 m)   Wt 122 lb 12.8 oz (55.7 kg)   SpO2 97%   BMI 19.82 kg/m     Wt Readings from Last 3 Encounters:  01/03/21 122 lb 12.8 oz (55.7 kg)  12/23/20 123 lb (55.8 kg)  12/16/20 125 lb 6.4 oz (56.9 kg)     GEN:  Well nourished, well developed in no acute distress HEENT: Normal NECK: No JVD; No  carotid bruits LYMPHATICS: No lymphadenopathy CARDIAC: irregularly irregular, 3/6 holosystolic murmur at the apex RESPIRATORY:  Clear to auscultation without rales, wheezing or rhonchi  ABDOMEN: Soft, non-tender, non-distended MUSCULOSKELETAL:  No edema; No deformity  SKIN: Warm and dry NEUROLOGIC:  Alert and oriented x 3 PSYCHIATRIC:  Normal affect   ASSESSMENT:    1. Nonrheumatic mitral valve regurgitation    PLAN:    In order of problems listed above:  1. The patient has severe mitral regurgitation with New York Heart Association functional class III symptoms of chronic diastolic heart failure/shortness of breath.  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  The patient has undergone multidisciplinary heart team evaluation, including formal cardiac surgical consultation.  She was not considered a suitable candidate for mitral valve repair and coronary bypass surgery because of her advanced age and multiple comorbid medical conditions.  The patient has undergone successful atherectomy and stenting of the right coronary artery with very modest improvement in her symptoms.  She continues to have functional class III limitation, likely secondary to her severe mitral  regurgitation.  The patient has been counseled extensively regarding treatment options.  We agree that transcatheter edge-to-edge mitral valve repair with MitraClip is indicated.  I have reviewed risks, indications, and potential benefits of the MitraClip procedure with the patient.  She understands that the procedure is done percutaneously under general anesthesia with transesophageal echo guidance.  She understands the risks of serious complication such as vascular injury, infection, bleeding, myocardial infarction, stroke, arrhythmia, cardiac perforation, cardiac tamponade, need for emergency cardiac surgery, device embolization, and death, occur at low frequency of approximately 1%.  The patient provides full informed consent for the procedure.  She is given instructions on continuing clopidogrel and temporarily interrupting apixaban to minimize bleeding risk at the time of the procedure.  We will attempt to minimize her time off of apixaban with her history of prior stroke.   Medication Adjustments/Labs and Tests Ordered: Current medicines are reviewed at length with the patient today.  Concerns regarding medicines are outlined above.  No orders of the defined types were placed in this encounter.  No orders of the defined types were placed in this encounter.   There are no Patient Instructions on file for this visit.   Signed, [name], MD  01/03/2021 11:21 AM    [place]

## 2021-01-03 NOTE — H&P (View-Only) (Signed)
Cardiology Office Note:    Date:  01/03/2021   ID:  Teresa Hughes, DOB March 08, 1941, MRN [id number]  PCP:  Nicoletta Dress, MD   [facility] Providers Cardiologist:  Shirlee More, MD     Referring MD: Nicoletta Dress, MD   Chief Complaint  Patient presents with  . Shortness of Breath    History of Present Illness:    Teresa Hughes is a 80 y.o. female with a hx of mitral regurgitation, presenting for follow-up evaluation.  The patient has longstanding persistent atrial fibrillation and chronic diastolic heart failure.  She has developed severe symptomatic mitral regurgitation and she underwent multidisciplinary review with formal cardiac surgical consultation after undergoing initial consultation with me October 20, 2020.  The patient underwent cardiac catheterization demonstrating critical stenosis of the right coronary artery and moderately severe stenosis of the proximal LAD.  She underwent complex atherectomy, PCI, and stenting of the RCA Dec 22, 2020 with recommendations for ongoing medical therapy of the LAD and the other residual CAD.  When she was seen by Dr. Roxy Manns with cardiac surgery, it was felt she would be at very high risk of conventional surgical mitral valve repair along with CABG.  This was in light of her advanced age and multiple comorbid medical conditions.  The patient presents today for follow-up evaluation.  The patient has had modest improvement since undergoing PCI.  She remains short of breath with activity.  She continues to have fatigue.  She has not had recent problems with orthopnea or PND.  The patient is short of breath with walking less than 1 block at a normal pace.  She denies lightheadedness, heart palpitations, or syncope.  She admits to easy bruising.  Otherwise no specific complaints today.  Past Medical History:  Diagnosis Date  . Acute kidney injury ([place])   . Anemia of chronic disease   . Aneurysm ([place]) 07/01/2018  . Anxiety   . Cerebral  thrombosis with cerebral infarction ([place]) 05/07/2012  . CHF (congestive heart failure) ([place])   . Chronic anticoagulation 08/28/2016  . Chronic atrial fibrillation ([place]) 08/28/2016  . Chronic insomnia   . Chronic renal disease   . Coronary artery disease   . DDD (degenerative disc disease), cervical   . Esophageal stricture   . GERD (gastroesophageal reflux disease)   . Gout   . Hiatal hernia   . High risk medication use 08/28/2016  . Hypercalcemia   . Hypertensive heart disease without heart failure 08/28/2016  . Late effects of CVA (cerebrovascular accident)   . Macular degeneration, dry   . Osteopenia   . Osteoporosis   . Other hyperlipidemia 08/28/2016  . Paroxysmal SVT (supraventricular tachycardia) ([place])   . Vitamin D deficiency     Past Surgical History:  Procedure Laterality Date  . CATARACT EXTRACTION Bilateral    Right 04/02/17, Left 9/22018  . CORONARY ATHERECTOMY N/A 12/22/2020   Procedure: CORONARY ATHERECTOMY;  Surgeon: [name], MD;  Location: [facility];  Service: Cardiovascular;  Laterality: N/A;  . CORONARY STENT INTERVENTION N/A 12/22/2020   Procedure: CORONARY STENT INTERVENTION;  Surgeon: [name], MD;  Location: [facility];  Service: Cardiovascular;  Laterality: N/A;  . ERCP W/ SPHINCTEROTOMY AND BALLOON DILATION    . HEMORRHOID SURGERY  2016  . RIGHT/LEFT HEART CATH AND CORONARY ANGIOGRAPHY N/A 10/29/2020   Procedure: RIGHT/LEFT HEART CATH AND CORONARY ANGIOGRAPHY;  Surgeon: [name], MD;  Location: [facility];  Service: Cardiovascular;  Laterality: N/A;  . TEE WITHOUT CARDIOVERSION N/A 09/14/2020   Procedure: TRANSESOPHAGEAL ECHOCARDIOGRAM (TEE);  Surgeon: Buford Dresser, MD;  Location: [facility];  Service: Cardiovascular;  Laterality: N/A;    Current Medications: Current Meds  Medication Sig  . acetaminophen (TYLENOL) 650 MG CR tablet Take 650 mg by mouth every 8 (eight) hours as needed for pain.  Marland Kitchen  alendronate (FOSAMAX) 70 MG tablet Take 70 mg by mouth every Sunday. Take with a full glass of water on an empty stomach.  Marland Kitchen allopurinol (ZYLOPRIM) 300 MG tablet Take 300 mg by mouth at bedtime.  Marland Kitchen apixaban (ELIQUIS) 2.5 MG TABS tablet Take 1 tablet (2.5 mg total) by mouth 2 (two) times daily.  . Biotin 5 MG CAPS Take 5 mg by mouth daily.  . Cholecalciferol (VITAMIN D3) 25 MCG (1000 UT) CAPS Take 1,000 Units by mouth daily.  . clopidogrel (PLAVIX) 75 MG tablet Take 1 tablet (75 mg total) by mouth daily.  . digoxin (LANOXIN) 0.125 MG tablet Take 0.0625 mg by mouth daily.  . furosemide (LASIX) 40 MG tablet Take 40 mg by mouth daily.  . metoprolol tartrate (LOPRESSOR) 50 MG tablet Take 50 mg by mouth 2 (two) times daily.  . mirtazapine (REMERON) 15 MG tablet Take 7.5 mg by mouth at bedtime.  . Multiple Vitamin (MULTIVITAMIN WITH MINERALS) TABS tablet Take 1 tablet by mouth daily.  . Multiple Vitamins-Minerals (PRESERVISION AREDS 2) CAPS Take 1 capsule by mouth 2 (two) times daily.  Marland Kitchen omega-3 acid ethyl esters (LOVAZA) 1 g capsule Take 2 g by mouth 2 (two) times daily.  . pantoprazole (PROTONIX) 40 MG tablet Take 1 tablet (40 mg total) by mouth daily.  Vladimir Faster Glycol-Propyl Glycol (SYSTANE OP) Place 1 drop into both eyes 2 (two) times daily.  . potassium chloride SA (KLOR-CON) 20 MEQ tablet Take 20 mEq by mouth 2 (two) times daily.  . rosuvastatin (CRESTOR) 20 MG tablet Take 1 tablet (20 mg total) by mouth daily.  . Turmeric 500 MG TABS Take 500 mg by mouth daily.     Allergies:   Propranolol, Sulfamethoxazole, Codeine, and Latex   Social History   Socioeconomic History  . Marital status: Unknown    Spouse name: Not on file  . Number of children: Not on file  . Years of education: Not on file  . Highest education level: Not on file  Occupational History  . Not on file  Tobacco Use  . Smoking status: Never Smoker  . Smokeless tobacco: Current User    Types: Snuff  Substance and  Sexual Activity  . Alcohol use: Never  . Drug use: Never  . Sexual activity: Not on file  Other Topics Concern  . Not on file  Social History Narrative  . Not on file   Social Determinants of Health   Financial Resource Strain: Not on file  Food Insecurity: Not on file  Transportation Needs: Not on file  Physical Activity: Not on file  Stress: Not on file  Social Connections: Not on file     Family History: The patient's family history includes Aneurysm in her maternal grandmother; Congestive Heart Failure in her father; Diabetes in her mother; Heart attack in her brother, father, and sister; Heart disease in her brother, father, maternal grandfather, and maternal grandmother; Hypertension in her brother and mother; Skin cancer in her brother; Stroke in her maternal grandfather and mother.  ROS:   Please see the history of present illness.    All other  systems reviewed and are negative.  EKGs/Labs/Other Studies Reviewed:    The following studies were reviewed today: PCI Jan 05, 2021: Conclusion  Successful atherectomy and stenting of severe, calcified, complex stenoses in the mid RCA using balloon angioplasty, orbital atherectomy, and stenting  Recommendations:  Overnight observation with plans for discharge tomorrow as long as no complications arise  Aspirin 81 mg x 1 week (patient at high bleeding risk)  Clopidogrel 75 mg x 6 months  Resume apixaban 2.5 mg twice daily tomorrow morning  Arranged transcatheter edge-to-edge repair of the mitral valve after patient seen back in follow-up as long as she is clinically stable.  TEE 09/14/2020: IMPRESSIONS    1. Left ventricular ejection fraction, by estimation, is 50 to 55%. The  left ventricle has low normal function.  2. Right ventricular systolic function is normal. The right ventricular  size is normal.  3. No left atrial/left atrial appendage thrombus was detected.  4. A small pericardial effusion is present.   5. The mitral valve is abnormal. Severe mitral valve regurgitation. There  is severe holosystolic prolapse of the middle scallop of the posterior  leaflet of the mitral valve.  6. The aortic valve is tricuspid. Aortic valve regurgitation is mild. No  aortic stenosis is present.  7. There is Moderate (Grade III) plaque involving the descending aorta.   Conclusion(s)/Recommendation(s): There is a flail P2 leaflet with  holosystolic prolapse. MR jet wraps entire atrium in an eccentric manner,  consistent with severe MR. There is prolapse of a small chordal structure  and there is also an independently  mobile chord in the subvalvular apparatus. This may be cause of flail.  Cardiac Cath 10/29/2020: Conclusion  1.  Severe two-vessel coronary artery disease with severe complex stenosis of the mid RCA and severe stenosis of the proximal LAD 2.  Known severe mitral regurgitation with right heart catheterization demonstrating preserved cardiac output, mild pulmonary hypertension with mean PA pressure 27, and pulmonary wedge V wave of 17 mmHg  Recommendations: Continued multidisciplinary heart team review.  Recommend formal surgical consultation.  I am not sure this elderly patient will be candidate for conventional surgery.  Other considerations would be PCI and transcatheter edge-to-edge repair of the mitral valve versus palliative medical therapy. Heart team review with Dr Bettina Gavia and multidisciplinary valve team.   EKG:  EKG is not ordered today.  The ekg ordered 12/23/2020 demonstrates atrial fibrillation, heart rate 84 bpm, ST and T wave changes consider lateral ischemia  Recent Labs: 12/23/2020: BUN 16; Creatinine, Ser 0.92; Hemoglobin 11.3; Platelets 145; Potassium 3.7; Sodium 139  Recent Lipid Panel No results found for: CHOL, TRIG, HDL, CHOLHDL, VLDL, LDLCALC, LDLDIRECT   Risk Assessment/Calculations:    CHA2DS2-VASc Score = 6  This indicates a 9.7% annual risk of stroke. The  patient's score is based upon: CHF History: Yes HTN History: Yes Diabetes History: No Stroke History: No Vascular Disease History: Yes Age Score: 2 Gender Score: 1      Physical Exam:    VS:  BP 124/62   Pulse 85   Ht 5\' 6"  (1.676 m)   Wt 122 lb 12.8 oz (55.7 kg)   SpO2 97%   BMI 19.82 kg/m     Wt Readings from Last 3 Encounters:  01/03/21 122 lb 12.8 oz (55.7 kg)  12/23/20 123 lb (55.8 kg)  12/16/20 125 lb 6.4 oz (56.9 kg)     GEN:  Well nourished, well developed in no acute distress HEENT: Normal NECK: No JVD; No  carotid bruits LYMPHATICS: No lymphadenopathy CARDIAC: irregularly irregular, 3/6 holosystolic murmur at the apex RESPIRATORY:  Clear to auscultation without rales, wheezing or rhonchi  ABDOMEN: Soft, non-tender, non-distended MUSCULOSKELETAL:  No edema; No deformity  SKIN: Warm and dry NEUROLOGIC:  Alert and oriented x 3 PSYCHIATRIC:  Normal affect   ASSESSMENT:    1. Nonrheumatic mitral valve regurgitation    PLAN:    In order of problems listed above:  1. The patient has severe mitral regurgitation with New York Heart Association functional class III symptoms of chronic diastolic heart failure/shortness of breath.  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  The patient has undergone multidisciplinary heart team evaluation, including formal cardiac surgical consultation.  She was not considered a suitable candidate for mitral valve repair and coronary bypass surgery because of her advanced age and multiple comorbid medical conditions.  The patient has undergone successful atherectomy and stenting of the right coronary artery with very modest improvement in her symptoms.  She continues to have functional class III limitation, likely secondary to her severe mitral  regurgitation.  The patient has been counseled extensively regarding treatment options.  We agree that transcatheter edge-to-edge mitral valve repair with MitraClip is indicated.  I have reviewed risks, indications, and potential benefits of the MitraClip procedure with the patient.  She understands that the procedure is done percutaneously under general anesthesia with transesophageal echo guidance.  She understands the risks of serious complication such as vascular injury, infection, bleeding, myocardial infarction, stroke, arrhythmia, cardiac perforation, cardiac tamponade, need for emergency cardiac surgery, device embolization, and death, occur at low frequency of approximately 1%.  The patient provides full informed consent for the procedure.  She is given instructions on continuing clopidogrel and temporarily interrupting apixaban to minimize bleeding risk at the time of the procedure.  We will attempt to minimize her time off of apixaban with her history of prior stroke.   Medication Adjustments/Labs and Tests Ordered: Current medicines are reviewed at length with the patient today.  Concerns regarding medicines are outlined above.  No orders of the defined types were placed in this encounter.  No orders of the defined types were placed in this encounter.   There are no Patient Instructions on file for this visit.   Signed, [name], MD  01/03/2021 11:21 AM    [place]

## 2021-01-11 ENCOUNTER — Other Ambulatory Visit: Payer: Self-pay

## 2021-01-11 ENCOUNTER — Telehealth: Payer: Self-pay

## 2021-01-11 DIAGNOSIS — I340 Nonrheumatic mitral (valve) insufficiency: Secondary | ICD-10-CM

## 2021-01-11 NOTE — Telephone Encounter (Signed)
**Note De-Identified Teresa Hughes Obfuscation** The pt received a letter from [facility] stating that a prior authorization is needed for her Rosuvastatin. I did the prior authorization through cover my meds and received the following message: Teresa Hughes Key: [id number] Outcome: Available without authorization. Drug: Rosuvastatin Calcium 20MG  tablets Form: [employer] Electronic PA Form  I did call the pt and made her aware that her Rosuvastatin is now covered by [facility]. She thanked me for calling to let her know.

## 2021-01-17 NOTE — Progress Notes (Signed)
Surgical Instructions    Your procedure is scheduled on 01/20/21.  Report to [facility] Main Entrance "A" at 08:45 A.M., then check in with the Admitting office.  Call this number if you have problems the morning of surgery:  [phone]   If you have any questions prior to your surgery date call [phone]: Open Monday-Friday 8am-4pm    Remember:  Do not eat after midnight the night before your surgery  You may drink clear liquids until 05:00 AM the morning of your surgery.   Clear liquids allowed are: Water, Non-Citrus Juices (without pulp), Carbonated Beverages, Clear Tea, Black Coffee Only, and Gatorade     Stop takingEliquis on 6/8 (Wednesday). You will take your last dose on Tuesday evening, 6/7.  Continue taking all other medications without change through the day before surgery. On the morning of surgery do not take Furosemide and Potassium Chloride. You may take all other prescription medications with a few sips of water.            Do not wear jewelry or makeup Do not wear lotions, powders, perfumes/colognes, or deodorant. Do not shave 48 hours prior to surgery.  Men may shave face and neck. Do not bring valuables to the hospital. DO Not wear nail polish, gel polish, artificial nails, or any other type of covering on  natural nails including finger and toenails. If patients have artificial nails, gel coating, etc. that need to be removed by a nail salon please have this removed prior to surgery or surgery may need to be canceled/delayed if the surgeon/ anesthesia feels like the patient is unable to be adequately monitored.             [facility] is not responsible for any belongings or valuables.  Do NOT Smoke (Tobacco/Vaping) or drink Alcohol 24 hours prior to your procedure If you use a CPAP at night, you may bring all equipment for your overnight stay.   Contacts, glasses, dentures or bridgework may not be worn into surgery, please bring cases for these  belongings   For patients admitted to the hospital, discharge time will be determined by your treatment team.   Patients discharged the day of surgery will not be allowed to drive home, and someone needs to stay with them for 24 hours.    Special instructions:    Oral Hygiene is also important to reduce your risk of infection.  Remember - BRUSH YOUR TEETH THE MORNING OF SURGERY WITH YOUR REGULAR TOOTHPASTE   [place]- Preparing For Surgery  Before surgery, you can play an important role. Because skin is not sterile, your skin needs to be as free of germs as possible. You can reduce the number of germs on your skin by washing with CHG (chlorahexidine gluconate) Soap before surgery.  CHG is an antiseptic cleaner which kills germs and bonds with the skin to continue killing germs even after washing.     Please do not use if you have an allergy to CHG or antibacterial soaps. If your skin becomes reddened/irritated stop using the CHG.  Do not shave (including legs and underarms) for at least 48 hours prior to first CHG shower. It is OK to shave your face.  Please follow these instructions carefully.    1.  Shower the NIGHT BEFORE SURGERY and the MORNING OF SURGERY with CHG Soap.   If you chose to wash your hair, wash your hair first as usual with your normal shampoo. After you shampoo, rinse your  hair and body thoroughly to remove the shampoo.  Then [employer] and genitals (private parts) with your normal soap and rinse thoroughly to remove soap.  2. After that Use CHG Soap as you would any other liquid soap. You can apply CHG directly to the skin and wash gently with a scrungie or a clean washcloth.   3. Apply the CHG Soap to your body ONLY FROM THE NECK DOWN.  Do not use on open wounds or open sores. Avoid contact with your eyes, ears, mouth and genitals (private parts). Wash Face and genitals (private parts)  with your normal soap.   4. Wash thoroughly, paying special attention to the  area where your surgery will be performed.  5. Thoroughly rinse your body with warm water from the neck down.  6. DO NOT shower/wash with your normal soap after using and rinsing off the CHG Soap.  7. Pat yourself dry with a CLEAN TOWEL.  8. Wear CLEAN PAJAMAS to bed the night before surgery  9. Place CLEAN SHEETS on your bed the night before your surgery  10. DO NOT SLEEP WITH PETS.   Day of Surgery: Take a shower with CHG soap. Wear Clean/Comfortable clothing the morning of surgery Do not apply any deodorants/lotions.   Remember to brush your teeth WITH YOUR REGULAR TOOTHPASTE.   Please read over the following fact sheets that you were given.

## 2021-01-18 ENCOUNTER — Encounter (HOSPITAL_COMMUNITY)
Admission: RE | Admit: 2021-01-18 | Discharge: 2021-01-18 | Disposition: A | Discharge status: Home / Self Care | Payer: Medicare HMO | Source: Ambulatory Visit | Attending: Cardiovascular Disease | Admitting: Cardiovascular Disease

## 2021-01-18 ENCOUNTER — Other Ambulatory Visit: Payer: Self-pay

## 2021-01-18 ENCOUNTER — Encounter (HOSPITAL_COMMUNITY): Payer: Self-pay

## 2021-01-18 DIAGNOSIS — I312 Hemopericardium, not elsewhere classified: Secondary | ICD-10-CM | POA: Diagnosis not present

## 2021-01-18 DIAGNOSIS — I313 Pericardial effusion (noninflammatory): Secondary | ICD-10-CM | POA: Diagnosis present

## 2021-01-18 DIAGNOSIS — Z8673 Personal history of transient ischemic attack (TIA), and cerebral infarction without residual deficits: Secondary | ICD-10-CM | POA: Diagnosis not present

## 2021-01-18 DIAGNOSIS — K219 Gastro-esophageal reflux disease without esophagitis: Secondary | ICD-10-CM | POA: Diagnosis present

## 2021-01-18 DIAGNOSIS — I080 Rheumatic disorders of both mitral and aortic valves: Secondary | ICD-10-CM | POA: Diagnosis not present

## 2021-01-18 DIAGNOSIS — Z954 Presence of other heart-valve replacement: Secondary | ICD-10-CM | POA: Diagnosis not present

## 2021-01-18 DIAGNOSIS — J9811 Atelectasis: Secondary | ICD-10-CM | POA: Diagnosis not present

## 2021-01-18 DIAGNOSIS — R55 Syncope and collapse: Secondary | ICD-10-CM | POA: Diagnosis not present

## 2021-01-18 DIAGNOSIS — R579 Shock, unspecified: Secondary | ICD-10-CM | POA: Diagnosis present

## 2021-01-18 DIAGNOSIS — I672 Cerebral atherosclerosis: Secondary | ICD-10-CM | POA: Diagnosis not present

## 2021-01-18 DIAGNOSIS — N179 Acute kidney failure, unspecified: Secondary | ICD-10-CM | POA: Diagnosis present

## 2021-01-18 DIAGNOSIS — I083 Combined rheumatic disorders of mitral, aortic and tricuspid valves: Secondary | ICD-10-CM | POA: Diagnosis not present

## 2021-01-18 DIAGNOSIS — D638 Anemia in other chronic diseases classified elsewhere: Secondary | ICD-10-CM | POA: Diagnosis present

## 2021-01-18 DIAGNOSIS — R29810 Facial weakness: Secondary | ICD-10-CM | POA: Diagnosis present

## 2021-01-18 DIAGNOSIS — G4730 Sleep apnea, unspecified: Secondary | ICD-10-CM | POA: Diagnosis not present

## 2021-01-18 DIAGNOSIS — I340 Nonrheumatic mitral (valve) insufficiency: Secondary | ICD-10-CM

## 2021-01-18 DIAGNOSIS — I482 Chronic atrial fibrillation, unspecified: Secondary | ICD-10-CM | POA: Diagnosis not present

## 2021-01-18 DIAGNOSIS — Z01818 Encounter for other preprocedural examination: Secondary | ICD-10-CM

## 2021-01-18 DIAGNOSIS — Z006 Encounter for examination for normal comparison and control in clinical research program: Secondary | ICD-10-CM | POA: Diagnosis not present

## 2021-01-18 DIAGNOSIS — I4821 Permanent atrial fibrillation: Secondary | ICD-10-CM | POA: Diagnosis present

## 2021-01-18 DIAGNOSIS — N189 Chronic kidney disease, unspecified: Secondary | ICD-10-CM | POA: Diagnosis present

## 2021-01-18 DIAGNOSIS — E559 Vitamin D deficiency, unspecified: Secondary | ICD-10-CM | POA: Diagnosis not present

## 2021-01-18 DIAGNOSIS — I2510 Atherosclerotic heart disease of native coronary artery without angina pectoris: Secondary | ICD-10-CM | POA: Diagnosis present

## 2021-01-18 DIAGNOSIS — I5042 Chronic combined systolic (congestive) and diastolic (congestive) heart failure: Secondary | ICD-10-CM | POA: Diagnosis present

## 2021-01-18 DIAGNOSIS — G9340 Encephalopathy, unspecified: Secondary | ICD-10-CM | POA: Diagnosis present

## 2021-01-18 DIAGNOSIS — D62 Acute posthemorrhagic anemia: Secondary | ICD-10-CM | POA: Diagnosis not present

## 2021-01-18 DIAGNOSIS — I272 Pulmonary hypertension, unspecified: Secondary | ICD-10-CM | POA: Diagnosis present

## 2021-01-18 DIAGNOSIS — I6930 Unspecified sequelae of cerebral infarction: Secondary | ICD-10-CM | POA: Diagnosis not present

## 2021-01-18 DIAGNOSIS — Z978 Presence of other specified devices: Secondary | ICD-10-CM | POA: Diagnosis not present

## 2021-01-18 DIAGNOSIS — I700 Atherosclerosis of aorta: Secondary | ICD-10-CM | POA: Diagnosis present

## 2021-01-18 DIAGNOSIS — F419 Anxiety disorder, unspecified: Secondary | ICD-10-CM | POA: Diagnosis present

## 2021-01-18 DIAGNOSIS — Q211 Atrial septal defect: Secondary | ICD-10-CM | POA: Diagnosis not present

## 2021-01-18 DIAGNOSIS — J9601 Acute respiratory failure with hypoxia: Secondary | ICD-10-CM | POA: Diagnosis present

## 2021-01-18 DIAGNOSIS — I5032 Chronic diastolic (congestive) heart failure: Secondary | ICD-10-CM | POA: Diagnosis not present

## 2021-01-18 DIAGNOSIS — I351 Nonrheumatic aortic (valve) insufficiency: Secondary | ICD-10-CM | POA: Diagnosis not present

## 2021-01-18 DIAGNOSIS — R531 Weakness: Secondary | ICD-10-CM | POA: Diagnosis not present

## 2021-01-18 DIAGNOSIS — D631 Anemia in chronic kidney disease: Secondary | ICD-10-CM | POA: Diagnosis present

## 2021-01-18 DIAGNOSIS — Z20822 Contact with and (suspected) exposure to covid-19: Secondary | ICD-10-CM | POA: Diagnosis present

## 2021-01-18 DIAGNOSIS — I314 Cardiac tamponade: Secondary | ICD-10-CM | POA: Diagnosis present

## 2021-01-18 DIAGNOSIS — R578 Other shock: Secondary | ICD-10-CM | POA: Diagnosis not present

## 2021-01-18 DIAGNOSIS — I130 Hypertensive heart and chronic kidney disease with heart failure and stage 1 through stage 4 chronic kidney disease, or unspecified chronic kidney disease: Secondary | ICD-10-CM | POA: Diagnosis present

## 2021-01-18 DIAGNOSIS — I480 Paroxysmal atrial fibrillation: Secondary | ICD-10-CM | POA: Diagnosis not present

## 2021-01-18 DIAGNOSIS — I471 Supraventricular tachycardia: Secondary | ICD-10-CM | POA: Diagnosis not present

## 2021-01-18 HISTORY — DX: Unspecified asthma, uncomplicated: J45.909

## 2021-01-18 HISTORY — DX: Sleep apnea, unspecified: G47.30

## 2021-01-18 HISTORY — DX: Pneumonia, unspecified organism: J18.9

## 2021-01-18 HISTORY — DX: Cardiac murmur, unspecified: R01.1

## 2021-01-18 HISTORY — DX: Cardiac arrhythmia, unspecified: I49.9

## 2021-01-18 HISTORY — DX: Essential (primary) hypertension: I10

## 2021-01-18 HISTORY — DX: Dyspnea, unspecified: R06.00

## 2021-01-18 LAB — SARS CORONAVIRUS 2 (TAT 6-24 HRS): SARS Coronavirus 2: NEGATIVE

## 2021-01-18 LAB — CBC
HCT: 39 % (ref 36.0–46.0)
Hemoglobin: 13.2 g/dL (ref 12.0–15.0)
MCH: 32 pg (ref 26.0–34.0)
MCHC: 33.8 g/dL (ref 30.0–36.0)
MCV: 94.7 fL (ref 80.0–100.0)
Platelets: 170 10*3/uL (ref 150–400)
RBC: 4.12 MIL/uL (ref 3.87–5.11)
RDW: 14.2 % (ref 11.5–15.5)
WBC: 8.5 10*3/uL (ref 4.0–10.5)
nRBC: 0 % (ref 0.0–0.2)

## 2021-01-18 LAB — BLOOD GAS, ARTERIAL
Acid-base deficit: 0.2 mmol/L (ref 0.0–2.0)
Bicarbonate: 23.5 mmol/L (ref 20.0–28.0)
FIO2: 21
O2 Saturation: 97.8 %
Patient temperature: 37
pCO2 arterial: 35.7 mmHg (ref 32.0–48.0)
pH, Arterial: 7.435 (ref 7.350–7.450)
pO2, Arterial: 95.1 mmHg (ref 83.0–108.0)

## 2021-01-18 LAB — URINALYSIS, ROUTINE W REFLEX MICROSCOPIC
Bilirubin Urine: NEGATIVE
Glucose, UA: NEGATIVE mg/dL
Hgb urine dipstick: NEGATIVE
Ketones, ur: NEGATIVE mg/dL
Leukocytes,Ua: NEGATIVE
Nitrite: NEGATIVE
Protein, ur: NEGATIVE mg/dL
Specific Gravity, Urine: 1.013 (ref 1.005–1.030)
pH: 5 (ref 5.0–8.0)

## 2021-01-18 LAB — COMPREHENSIVE METABOLIC PANEL
ALT: 17 U/L (ref 0–44)
AST: 24 U/L (ref 15–41)
Albumin: 3.8 g/dL (ref 3.5–5.0)
Alkaline Phosphatase: 58 U/L (ref 38–126)
Anion gap: 11 (ref 5–15)
BUN: 17 mg/dL (ref 8–23)
CO2: 23 mmol/L (ref 22–32)
Calcium: 9.2 mg/dL (ref 8.9–10.3)
Chloride: 106 mmol/L (ref 98–111)
Creatinine, Ser: 1.02 mg/dL — ABNORMAL HIGH (ref 0.44–1.00)
GFR, Estimated: 55 mL/min — ABNORMAL LOW (ref >60)
Glucose, Bld: 125 mg/dL — ABNORMAL HIGH (ref 70–99)
Potassium: 4.1 mmol/L (ref 3.5–5.1)
Sodium: 140 mmol/L (ref 135–145)
Total Bilirubin: 1 mg/dL (ref 0.3–1.2)
Total Protein: 6.4 g/dL — ABNORMAL LOW (ref 6.5–8.1)

## 2021-01-18 LAB — TYPE AND SCREEN
ABO/RH(D): O NEG
Antibody Screen: NEGATIVE

## 2021-01-18 LAB — SURGICAL PCR SCREEN
MRSA, PCR: NEGATIVE
Staphylococcus aureus: NEGATIVE

## 2021-01-18 LAB — PROTIME-INR
INR: 1.3 — ABNORMAL HIGH (ref 0.8–1.2)
Prothrombin Time: 15.9 seconds — ABNORMAL HIGH (ref 11.4–15.2)

## 2021-01-18 NOTE — Progress Notes (Signed)
Notified Theodosia Quay of elevated PT result. She will inform Dr. Burt Knack.

## 2021-01-18 NOTE — Progress Notes (Addendum)
PCP - Nelda Bucks Cardiologist - [name]  PPM/ICD - denies   Chest x-ray - 01/18/21 EKG - 12/24/20 Stress Test - over 10 years ago, pt states it was normal ECHO - 09/14/20 Cardiac Cath - 10/29/20 and 12/22/20  Sleep Study - records requested from [place] [facility] CPAP - has one but does not wear it nightly  Stop takingEliquis on 6/8 (Wednesday). You will take your last dose on Tuesday evening, 6/7.  Continue taking all other medications without change through the day before surgery. On the morning of surgery do not take Furosemide and Potassium Chloride. You may take all other prescription medications with a few sips of water.    ERAS Protcol -no PRE-SURGERY Ensure or G2- no  COVID TEST- 01/18/21 in PAT   Anesthesia review: yes, cardiac history and PT 15.9  Patient denies shortness of breath, fever, cough and chest pain at PAT appointment   All instructions explained to the patient, with a verbal understanding of the material. Patient agrees to go over the instructions while at home for a better understanding. Patient also instructed to self quarantine after being tested for COVID-19. The opportunity to ask questions was provided.

## 2021-01-20 ENCOUNTER — Inpatient Hospital Stay (HOSPITAL_COMMUNITY): Payer: Medicare HMO | Admitting: Physician Assistant

## 2021-01-20 ENCOUNTER — Other Ambulatory Visit: Payer: Self-pay

## 2021-01-20 ENCOUNTER — Encounter (HOSPITAL_COMMUNITY): Payer: Self-pay | Admitting: Cardiovascular Disease

## 2021-01-20 ENCOUNTER — Inpatient Hospital Stay (HOSPITAL_COMMUNITY)
Admission: RE | Disposition: A | Discharge status: Home / Self Care | Payer: Self-pay | Source: Home / Self Care | Attending: Cardiovascular Disease

## 2021-01-20 ENCOUNTER — Encounter (HOSPITAL_COMMUNITY)
Admission: RE | Disposition: A | Discharge status: Home / Self Care | Payer: Self-pay | Source: Home / Self Care | Attending: Cardiovascular Disease

## 2021-01-20 ENCOUNTER — Inpatient Hospital Stay (HOSPITAL_COMMUNITY)
Admission: RE | Admit: 2021-01-20 | Discharge: 2021-01-20 | Disposition: A | Discharge status: Home / Self Care | Payer: Medicare HMO | Source: Ambulatory Visit | Attending: Cardiovascular Disease | Admitting: Cardiovascular Disease

## 2021-01-20 ENCOUNTER — Telehealth: Payer: Self-pay | Admitting: Physician Assistant

## 2021-01-20 ENCOUNTER — Inpatient Hospital Stay (HOSPITAL_COMMUNITY): Payer: Medicare HMO

## 2021-01-20 ENCOUNTER — Inpatient Hospital Stay (HOSPITAL_COMMUNITY)
Admission: RE | Admit: 2021-01-20 | Discharge: 2021-01-24 | DRG: 266 | Disposition: A | Discharge status: Home / Self Care | Payer: Medicare HMO | Attending: Cardiovascular Disease | Admitting: Cardiovascular Disease

## 2021-01-20 ENCOUNTER — Inpatient Hospital Stay (HOSPITAL_COMMUNITY): Payer: Medicare HMO | Admitting: Anesthesiology

## 2021-01-20 DIAGNOSIS — D638 Anemia in other chronic diseases classified elsewhere: Secondary | ICD-10-CM | POA: Diagnosis present

## 2021-01-20 DIAGNOSIS — I493 Ventricular premature depolarization: Secondary | ICD-10-CM | POA: Diagnosis present

## 2021-01-20 DIAGNOSIS — Z20822 Contact with and (suspected) exposure to covid-19: Secondary | ICD-10-CM | POA: Diagnosis present

## 2021-01-20 DIAGNOSIS — I6930 Unspecified sequelae of cerebral infarction: Secondary | ICD-10-CM | POA: Diagnosis not present

## 2021-01-20 DIAGNOSIS — Z7901 Long term (current) use of anticoagulants: Secondary | ICD-10-CM

## 2021-01-20 DIAGNOSIS — I2510 Atherosclerotic heart disease of native coronary artery without angina pectoris: Secondary | ICD-10-CM | POA: Diagnosis present

## 2021-01-20 DIAGNOSIS — R579 Shock, unspecified: Secondary | ICD-10-CM | POA: Diagnosis not present

## 2021-01-20 DIAGNOSIS — Z95818 Presence of other cardiac implants and grafts: Secondary | ICD-10-CM | POA: Insufficient documentation

## 2021-01-20 DIAGNOSIS — I272 Pulmonary hypertension, unspecified: Secondary | ICD-10-CM | POA: Diagnosis present

## 2021-01-20 DIAGNOSIS — R578 Other shock: Secondary | ICD-10-CM | POA: Diagnosis not present

## 2021-01-20 DIAGNOSIS — I672 Cerebral atherosclerosis: Secondary | ICD-10-CM | POA: Diagnosis not present

## 2021-01-20 DIAGNOSIS — R29810 Facial weakness: Secondary | ICD-10-CM | POA: Diagnosis not present

## 2021-01-20 DIAGNOSIS — Z954 Presence of other heart-valve replacement: Secondary | ICD-10-CM | POA: Diagnosis not present

## 2021-01-20 DIAGNOSIS — Z9889 Other specified postprocedural states: Secondary | ICD-10-CM

## 2021-01-20 DIAGNOSIS — K219 Gastro-esophageal reflux disease without esophagitis: Secondary | ICD-10-CM | POA: Diagnosis present

## 2021-01-20 DIAGNOSIS — I482 Chronic atrial fibrillation, unspecified: Secondary | ICD-10-CM | POA: Diagnosis present

## 2021-01-20 DIAGNOSIS — F419 Anxiety disorder, unspecified: Secondary | ICD-10-CM | POA: Diagnosis present

## 2021-01-20 DIAGNOSIS — I314 Cardiac tamponade: Secondary | ICD-10-CM | POA: Diagnosis present

## 2021-01-20 DIAGNOSIS — Q211 Atrial septal defect: Secondary | ICD-10-CM | POA: Diagnosis not present

## 2021-01-20 DIAGNOSIS — I5042 Chronic combined systolic (congestive) and diastolic (congestive) heart failure: Secondary | ICD-10-CM | POA: Diagnosis present

## 2021-01-20 DIAGNOSIS — J9601 Acute respiratory failure with hypoxia: Secondary | ICD-10-CM | POA: Diagnosis present

## 2021-01-20 DIAGNOSIS — J9811 Atelectasis: Secondary | ICD-10-CM | POA: Diagnosis not present

## 2021-01-20 DIAGNOSIS — Z882 Allergy status to sulfonamides status: Secondary | ICD-10-CM

## 2021-01-20 DIAGNOSIS — N179 Acute kidney failure, unspecified: Secondary | ICD-10-CM | POA: Diagnosis present

## 2021-01-20 DIAGNOSIS — D62 Acute posthemorrhagic anemia: Secondary | ICD-10-CM | POA: Diagnosis not present

## 2021-01-20 DIAGNOSIS — Z978 Presence of other specified devices: Secondary | ICD-10-CM | POA: Diagnosis not present

## 2021-01-20 DIAGNOSIS — I130 Hypertensive heart and chronic kidney disease with heart failure and stage 1 through stage 4 chronic kidney disease, or unspecified chronic kidney disease: Secondary | ICD-10-CM | POA: Diagnosis present

## 2021-01-20 DIAGNOSIS — Z91040 Latex allergy status: Secondary | ICD-10-CM

## 2021-01-20 DIAGNOSIS — Z8673 Personal history of transient ischemic attack (TIA), and cerebral infarction without residual deficits: Secondary | ICD-10-CM | POA: Diagnosis not present

## 2021-01-20 DIAGNOSIS — Z833 Family history of diabetes mellitus: Secondary | ICD-10-CM

## 2021-01-20 DIAGNOSIS — Z7902 Long term (current) use of antithrombotics/antiplatelets: Secondary | ICD-10-CM

## 2021-01-20 DIAGNOSIS — Z006 Encounter for examination for normal comparison and control in clinical research program: Secondary | ICD-10-CM

## 2021-01-20 DIAGNOSIS — Z808 Family history of malignant neoplasm of other organs or systems: Secondary | ICD-10-CM

## 2021-01-20 DIAGNOSIS — Z79899 Other long term (current) drug therapy: Secondary | ICD-10-CM

## 2021-01-20 DIAGNOSIS — I4821 Permanent atrial fibrillation: Secondary | ICD-10-CM | POA: Diagnosis not present

## 2021-01-20 DIAGNOSIS — Z8249 Family history of ischemic heart disease and other diseases of the circulatory system: Secondary | ICD-10-CM

## 2021-01-20 DIAGNOSIS — I351 Nonrheumatic aortic (valve) insufficiency: Secondary | ICD-10-CM | POA: Diagnosis not present

## 2021-01-20 DIAGNOSIS — I3139 Other pericardial effusion (noninflammatory): Secondary | ICD-10-CM

## 2021-01-20 DIAGNOSIS — R55 Syncope and collapse: Secondary | ICD-10-CM | POA: Diagnosis not present

## 2021-01-20 DIAGNOSIS — N189 Chronic kidney disease, unspecified: Secondary | ICD-10-CM | POA: Diagnosis present

## 2021-01-20 DIAGNOSIS — I312 Hemopericardium, not elsewhere classified: Secondary | ICD-10-CM | POA: Diagnosis not present

## 2021-01-20 DIAGNOSIS — D631 Anemia in chronic kidney disease: Secondary | ICD-10-CM | POA: Diagnosis present

## 2021-01-20 DIAGNOSIS — Z885 Allergy status to narcotic agent status: Secondary | ICD-10-CM

## 2021-01-20 DIAGNOSIS — I700 Atherosclerosis of aorta: Secondary | ICD-10-CM | POA: Diagnosis present

## 2021-01-20 DIAGNOSIS — I083 Combined rheumatic disorders of mitral, aortic and tricuspid valves: Secondary | ICD-10-CM | POA: Diagnosis not present

## 2021-01-20 DIAGNOSIS — E559 Vitamin D deficiency, unspecified: Secondary | ICD-10-CM | POA: Diagnosis present

## 2021-01-20 DIAGNOSIS — G9340 Encephalopathy, unspecified: Secondary | ICD-10-CM | POA: Diagnosis present

## 2021-01-20 DIAGNOSIS — Z720 Tobacco use: Secondary | ICD-10-CM

## 2021-01-20 DIAGNOSIS — E7849 Other hyperlipidemia: Secondary | ICD-10-CM | POA: Diagnosis present

## 2021-01-20 DIAGNOSIS — R531 Weakness: Secondary | ICD-10-CM | POA: Diagnosis not present

## 2021-01-20 DIAGNOSIS — Z823 Family history of stroke: Secondary | ICD-10-CM

## 2021-01-20 DIAGNOSIS — I480 Paroxysmal atrial fibrillation: Secondary | ICD-10-CM | POA: Diagnosis not present

## 2021-01-20 DIAGNOSIS — I080 Rheumatic disorders of both mitral and aortic valves: Secondary | ICD-10-CM | POA: Diagnosis not present

## 2021-01-20 DIAGNOSIS — I313 Pericardial effusion (noninflammatory): Secondary | ICD-10-CM | POA: Diagnosis present

## 2021-01-20 DIAGNOSIS — I340 Nonrheumatic mitral (valve) insufficiency: Principal | ICD-10-CM

## 2021-01-20 DIAGNOSIS — Z955 Presence of coronary angioplasty implant and graft: Secondary | ICD-10-CM

## 2021-01-20 DIAGNOSIS — Z888 Allergy status to other drugs, medicaments and biological substances status: Secondary | ICD-10-CM

## 2021-01-20 DIAGNOSIS — M810 Age-related osteoporosis without current pathological fracture: Secondary | ICD-10-CM | POA: Diagnosis present

## 2021-01-20 DIAGNOSIS — I5032 Chronic diastolic (congestive) heart failure: Secondary | ICD-10-CM | POA: Diagnosis not present

## 2021-01-20 HISTORY — DX: Presence of other cardiac implants and grafts: Z98.890

## 2021-01-20 HISTORY — DX: Nonrheumatic mitral (valve) insufficiency: I34.0

## 2021-01-20 HISTORY — PX: TEE WITHOUT CARDIOVERSION: SHX5443

## 2021-01-20 HISTORY — PX: PERICARDIOCENTESIS: CATH118255

## 2021-01-20 HISTORY — PX: MITRAL VALVE REPAIR: CATH118311

## 2021-01-20 HISTORY — DX: Presence of other cardiac implants and grafts: Z95.818

## 2021-01-20 LAB — POCT I-STAT 7, (LYTES, BLD GAS, ICA,H+H)
Acid-base deficit: 1 mmol/L (ref 0.0–2.0)
Bicarbonate: 23.8 mmol/L (ref 20.0–28.0)
Calcium, Ion: 1.04 mmol/L — ABNORMAL LOW (ref 1.15–1.40)
HCT: 36 % (ref 36.0–46.0)
Hemoglobin: 12.2 g/dL (ref 12.0–15.0)
O2 Saturation: 100 %
Patient temperature: 97.3
Potassium: 2.9 mmol/L — ABNORMAL LOW (ref 3.5–5.1)
Sodium: 145 mmol/L (ref 135–145)
TCO2: 25 mmol/L (ref 22–32)
pCO2 arterial: 38.1 mmHg (ref 32.0–48.0)
pH, Arterial: 7.401 (ref 7.350–7.450)
pO2, Arterial: 265 mmHg — ABNORMAL HIGH (ref 83.0–108.0)

## 2021-01-20 LAB — ECHO TEE
MV M vel: 4.56 m/s
MV Peak grad: 83.2 mmHg
MV VTI: 1.18 cm2
Radius: 1 cm

## 2021-01-20 LAB — BASIC METABOLIC PANEL
Anion gap: 11 (ref 5–15)
BUN: 13 mg/dL (ref 8–23)
CO2: 19 mmol/L — ABNORMAL LOW (ref 22–32)
Calcium: 7.1 mg/dL — ABNORMAL LOW (ref 8.9–10.3)
Chloride: 110 mmol/L (ref 98–111)
Creatinine, Ser: 1.07 mg/dL — ABNORMAL HIGH (ref 0.44–1.00)
GFR, Estimated: 52 mL/min — ABNORMAL LOW (ref >60)
Glucose, Bld: 249 mg/dL — ABNORMAL HIGH (ref 70–99)
Potassium: 3.1 mmol/L — ABNORMAL LOW (ref 3.5–5.1)
Sodium: 140 mmol/L (ref 135–145)

## 2021-01-20 LAB — GLUCOSE, CAPILLARY: Glucose-Capillary: 167 mg/dL — ABNORMAL HIGH (ref 70–99)

## 2021-01-20 LAB — CBC
HCT: 32.7 % — ABNORMAL LOW (ref 36.0–46.0)
Hemoglobin: 10.7 g/dL — ABNORMAL LOW (ref 12.0–15.0)
MCH: 31.7 pg (ref 26.0–34.0)
MCHC: 32.7 g/dL (ref 30.0–36.0)
MCV: 96.7 fL (ref 80.0–100.0)
Platelets: 162 10*3/uL (ref 150–400)
RBC: 3.38 MIL/uL — ABNORMAL LOW (ref 3.87–5.11)
RDW: 14.1 % (ref 11.5–15.5)
WBC: 13 10*3/uL — ABNORMAL HIGH (ref 4.0–10.5)
nRBC: 0 % (ref 0.0–0.2)

## 2021-01-20 LAB — POCT ACTIVATED CLOTTING TIME
Activated Clotting Time: 231 seconds
Activated Clotting Time: 347 seconds
Activated Clotting Time: 155 seconds

## 2021-01-20 LAB — MAGNESIUM: Magnesium: 1.8 mg/dL (ref 1.7–2.4)

## 2021-01-20 LAB — BRAIN NATRIURETIC PEPTIDE: B Natriuretic Peptide: 168.8 pg/mL — ABNORMAL HIGH (ref 0.0–100.0)

## 2021-01-20 LAB — TYPE AND SCREEN
ABO/RH(D): O NEG
Antibody Screen: NEGATIVE

## 2021-01-20 LAB — TROPONIN I (HIGH SENSITIVITY)
Troponin I (High Sensitivity): 278 ng/L (ref <18)
Troponin I (High Sensitivity): 267 ng/L (ref <18)

## 2021-01-20 LAB — ABO/RH: ABO/RH(D): O NEG

## 2021-01-20 SURGERY — MITRAL VALVE REPAIR
Anesthesia: General

## 2021-01-20 SURGERY — PERICARDIOCENTESIS
Anesthesia: LOCAL

## 2021-01-20 MED ORDER — POTASSIUM CHLORIDE 10 MEQ/50ML IV SOLN
10.0000 meq | INTRAVENOUS | Status: AC
  Administered 2021-01-20 (×2): 10 meq via INTRAVENOUS
  Filled 2021-01-20 (×2): qty 50

## 2021-01-20 MED ORDER — CEFAZOLIN SODIUM-DEXTROSE 2-4 GM/100ML-% IV SOLN
2.0000 g | INTRAVENOUS | Status: AC
  Administered 2021-01-20: 2 g via INTRAVENOUS
  Filled 2021-01-20: qty 100

## 2021-01-20 MED ORDER — EPINEPHRINE HCL 5 MG/250ML IV SOLN IN NS
INTRAVENOUS | Status: AC
  Filled 2021-01-20: qty 250

## 2021-01-20 MED ORDER — DIGOXIN 125 MCG PO TABS
0.0625 mg | ORAL_TABLET | Freq: Every day | ORAL | Status: DC
  Administered 2021-01-21: 0.0625 mg
  Filled 2021-01-20: qty 1

## 2021-01-20 MED ORDER — FENTANYL CITRATE (PF) 100 MCG/2ML IJ SOLN
25.0000 ug | INTRAMUSCULAR | Status: DC | PRN
  Administered 2021-01-20: 12.5 ug via INTRAVENOUS
  Filled 2021-01-20: qty 2

## 2021-01-20 MED ORDER — FENTANYL CITRATE (PF) 100 MCG/2ML IJ SOLN: INTRAMUSCULAR | Status: DC | PRN

## 2021-01-20 MED ORDER — METOPROLOL TARTRATE 50 MG PO TABS
50.0000 mg | ORAL_TABLET | Freq: Two times a day (BID) | ORAL | Status: DC
  Administered 2021-01-21: 50 mg
  Filled 2021-01-20: qty 1

## 2021-01-20 MED ORDER — DEXMEDETOMIDINE HCL IN NACL 400 MCG/100ML IV SOLN
0.4000 ug/kg/h | INTRAVENOUS | Status: DC
  Administered 2021-01-20: 0.2 ug/kg/h via INTRAVENOUS
  Administered 2021-01-20: 0.6 ug/kg/h via INTRAVENOUS
  Filled 2021-01-20 (×3): qty 100

## 2021-01-20 MED ORDER — PROTAMINE SULFATE 10 MG/ML IV SOLN
INTRAVENOUS | Status: DC | PRN
  Administered 2021-01-20: 30 mg via INTRAVENOUS

## 2021-01-20 MED ORDER — SODIUM CHLORIDE 0.9% FLUSH: 3.0000 mL | INTRAVENOUS | Status: DC | PRN

## 2021-01-20 MED ORDER — MAGNESIUM SULFATE 2 GM/50ML IV SOLN
2.0000 g | Freq: Once | INTRAVENOUS | Status: AC
  Administered 2021-01-20: 2 g via INTRAVENOUS
  Filled 2021-01-20: qty 50

## 2021-01-20 MED ORDER — ORAL CARE MOUTH RINSE: 15.0000 mL | Freq: Once | OROMUCOSAL | Status: AC

## 2021-01-20 MED ORDER — ORAL CARE MOUTH RINSE
15.0000 mL | OROMUCOSAL | Status: DC
  Administered 2021-01-20 – 2021-01-21 (×7): 15 mL via OROMUCOSAL

## 2021-01-20 MED ORDER — MIRTAZAPINE 15 MG PO TABS: 7.5000 mg | ORAL_TABLET | Freq: Every day | ORAL | Status: DC

## 2021-01-20 MED ORDER — ONDANSETRON HCL 4 MG/2ML IJ SOLN: 4.0000 mg | Freq: Four times a day (QID) | INTRAMUSCULAR | Status: DC | PRN

## 2021-01-20 MED ORDER — NOREPINEPHRINE 4 MG/250ML-% IV SOLN
INTRAVENOUS | Status: AC
  Filled 2021-01-20: qty 250

## 2021-01-20 MED ORDER — CHLORHEXIDINE GLUCONATE 0.12 % MT SOLN
15.0000 mL | Freq: Once | OROMUCOSAL | Status: AC
  Administered 2021-01-20: 15 mL via OROMUCOSAL
  Filled 2021-01-20 (×2): qty 15

## 2021-01-20 MED ORDER — ROCURONIUM BROMIDE 10 MG/ML (PF) SYRINGE
PREFILLED_SYRINGE | INTRAVENOUS | Status: DC | PRN
  Administered 2021-01-20: 60 mg via INTRAVENOUS

## 2021-01-20 MED ORDER — APIXABAN 2.5 MG PO TABS: 2.5000 mg | ORAL_TABLET | Freq: Two times a day (BID) | ORAL | Status: DC

## 2021-01-20 MED ORDER — NOREPINEPHRINE 4 MG/250ML-% IV SOLN: 0.0000 ug/min | INTRAVENOUS | Status: DC

## 2021-01-20 MED ORDER — HEPARIN (PORCINE) IN NACL 2000-0.9 UNIT/L-% IV SOLN
INTRAVENOUS | Status: DC | PRN
  Administered 2021-01-20 (×3): 1000 mL

## 2021-01-20 MED ORDER — CHLORHEXIDINE GLUCONATE 0.12% ORAL RINSE (MEDLINE KIT)
15.0000 mL | Freq: Two times a day (BID) | OROMUCOSAL | Status: DC
  Administered 2021-01-20 – 2021-01-22 (×5): 15 mL via OROMUCOSAL

## 2021-01-20 MED ORDER — FENTANYL CITRATE (PF) 100 MCG/2ML IJ SOLN
INTRAMUSCULAR | Status: DC | PRN
  Administered 2021-01-20: 25 ug via INTRAVENOUS

## 2021-01-20 MED ORDER — ONDANSETRON HCL 4 MG/2ML IJ SOLN
INTRAMUSCULAR | Status: DC | PRN
  Administered 2021-01-20: 4 mg via INTRAVENOUS

## 2021-01-20 MED ORDER — HEPARIN (PORCINE) IN NACL 1000-0.9 UT/500ML-% IV SOLN
INTRAVENOUS | Status: AC
  Filled 2021-01-20: qty 500

## 2021-01-20 MED ORDER — HEPARIN SODIUM (PORCINE) 1000 UNIT/ML IJ SOLN
INTRAMUSCULAR | Status: DC | PRN
  Administered 2021-01-20: 4000 [IU] via INTRAVENOUS
  Administered 2021-01-20: 6000 [IU] via INTRAVENOUS
  Administered 2021-01-20: 2000 [IU] via INTRAVENOUS

## 2021-01-20 MED ORDER — SODIUM CHLORIDE 0.9 % IV BOLUS: 500.0000 mL | Freq: Once | INTRAVENOUS | Status: DC

## 2021-01-20 MED ORDER — FENTANYL CITRATE (PF) 100 MCG/2ML IJ SOLN
INTRAMUSCULAR | Status: AC
  Filled 2021-01-20: qty 2

## 2021-01-20 MED ORDER — ETOMIDATE 2 MG/ML IV SOLN
INTRAVENOUS | Status: AC
  Filled 2021-01-20: qty 20

## 2021-01-20 MED ORDER — PHENYLEPHRINE 40 MCG/ML (10ML) SYRINGE FOR IV PUSH (FOR BLOOD PRESSURE SUPPORT)
PREFILLED_SYRINGE | INTRAVENOUS | Status: DC | PRN
  Administered 2021-01-20 (×2): 20 ug via INTRAVENOUS

## 2021-01-20 MED ORDER — DOPAMINE-DEXTROSE 3.2-5 MG/ML-% IV SOLN
INTRAVENOUS | Status: AC
  Filled 2021-01-20: qty 250

## 2021-01-20 MED ORDER — PHENYLEPHRINE HCL-NACL 10-0.9 MG/250ML-% IV SOLN
INTRAVENOUS | Status: DC | PRN
  Administered 2021-01-20: 25 ug/min via INTRAVENOUS

## 2021-01-20 MED ORDER — MIDAZOLAM HCL 2 MG/2ML IJ SOLN
INTRAMUSCULAR | Status: AC
  Filled 2021-01-20: qty 2

## 2021-01-20 MED ORDER — HEPARIN (PORCINE) IN NACL 2000-0.9 UNIT/L-% IV SOLN
INTRAVENOUS | Status: AC
  Filled 2021-01-20: qty 1000

## 2021-01-20 MED ORDER — CHLORHEXIDINE GLUCONATE 4 % EX LIQD: 60.0000 mL | Freq: Once | CUTANEOUS | Status: DC

## 2021-01-20 MED ORDER — ROCURONIUM BROMIDE 10 MG/ML (PF) SYRINGE
PREFILLED_SYRINGE | INTRAVENOUS | Status: AC
  Filled 2021-01-20: qty 10

## 2021-01-20 MED ORDER — FENTANYL CITRATE (PF) 100 MCG/2ML IJ SOLN
25.0000 ug | INTRAMUSCULAR | Status: DC | PRN
  Administered 2021-01-20: 25 ug via INTRAVENOUS
  Filled 2021-01-20: qty 2

## 2021-01-20 MED ORDER — FENTANYL CITRATE (PF) 100 MCG/2ML IJ SOLN
INTRAMUSCULAR | Status: DC | PRN
  Administered 2021-01-20: 100 ug via INTRAVENOUS

## 2021-01-20 MED ORDER — ROSUVASTATIN CALCIUM 20 MG PO TABS: 20.0000 mg | ORAL_TABLET | Freq: Every day | ORAL | Status: DC

## 2021-01-20 MED ORDER — EPINEPHRINE 1 MG/10ML IJ SOSY
PREFILLED_SYRINGE | INTRAMUSCULAR | Status: AC
  Filled 2021-01-20: qty 10

## 2021-01-20 MED ORDER — LIDOCAINE 2% (20 MG/ML) 5 ML SYRINGE
INTRAMUSCULAR | Status: DC | PRN
  Administered 2021-01-20: 40 mg via INTRAVENOUS

## 2021-01-20 MED ORDER — CLOPIDOGREL BISULFATE 75 MG PO TABS: 75.0000 mg | ORAL_TABLET | Freq: Every day | ORAL | Status: DC

## 2021-01-20 MED ORDER — NEOSTIGMINE METHYLSULFATE 10 MG/10ML IV SOLN: 2.0000 mg | Freq: Once | INTRAVENOUS | Status: DC

## 2021-01-20 MED ORDER — CHLORHEXIDINE GLUCONATE 4 % EX LIQD: 30.0000 mL | CUTANEOUS | Status: DC

## 2021-01-20 MED ORDER — PROPOFOL 10 MG/ML IV BOLUS
INTRAVENOUS | Status: DC | PRN
  Administered 2021-01-20: 10 mg via INTRAVENOUS
  Administered 2021-01-20: 30 mg via INTRAVENOUS

## 2021-01-20 MED ORDER — LIDOCAINE HCL (PF) 1 % IJ SOLN
INTRAMUSCULAR | Status: DC | PRN
  Administered 2021-01-20: 5 mL

## 2021-01-20 MED ORDER — CLOPIDOGREL BISULFATE 75 MG PO TABS
75.0000 mg | ORAL_TABLET | Freq: Every day | ORAL | Status: DC
  Administered 2021-01-21: 75 mg
  Filled 2021-01-20: qty 1

## 2021-01-20 MED ORDER — MIDAZOLAM HCL 2 MG/2ML IJ SOLN
INTRAMUSCULAR | Status: DC | PRN
  Administered 2021-01-20: 2 mg via INTRAVENOUS

## 2021-01-20 MED ORDER — ALLOPURINOL 300 MG PO TABS
300.0000 mg | ORAL_TABLET | Freq: Every day | ORAL | Status: DC
  Administered 2021-01-20: 300 mg
  Filled 2021-01-20: qty 1

## 2021-01-20 MED ORDER — SUGAMMADEX SODIUM 200 MG/2ML IV SOLN
2.0000 mg/kg | INTRAVENOUS | Status: AC
  Administered 2021-01-20: 110 mg via INTRAVENOUS
  Filled 2021-01-20: qty 2

## 2021-01-20 MED ORDER — DOCUSATE SODIUM 50 MG/5ML PO LIQD
100.0000 mg | Freq: Two times a day (BID) | ORAL | Status: DC
  Administered 2021-01-20 – 2021-01-21 (×2): 100 mg
  Filled 2021-01-20 (×2): qty 10

## 2021-01-20 MED ORDER — SODIUM CHLORIDE 0.9 % IV SOLN: INTRAVENOUS | Status: DC

## 2021-01-20 MED ORDER — LACTATED RINGERS IV SOLN: INTRAVENOUS | Status: DC

## 2021-01-20 MED ORDER — DEXAMETHASONE SODIUM PHOSPHATE 10 MG/ML IJ SOLN
INTRAMUSCULAR | Status: DC | PRN
  Administered 2021-01-20: 5 mg via INTRAVENOUS

## 2021-01-20 MED ORDER — LIDOCAINE HCL (PF) 1 % IJ SOLN
INTRAMUSCULAR | Status: AC
  Filled 2021-01-20: qty 30

## 2021-01-20 MED ORDER — PANTOPRAZOLE SODIUM 40 MG IV SOLR
40.0000 mg | Freq: Every day | INTRAVENOUS | Status: DC
  Administered 2021-01-21: 40 mg via INTRAVENOUS
  Filled 2021-01-20: qty 40

## 2021-01-20 MED ORDER — SUGAMMADEX SODIUM 200 MG/2ML IV SOLN
INTRAVENOUS | Status: DC | PRN
  Administered 2021-01-20: 120 mg via INTRAVENOUS

## 2021-01-20 MED ORDER — PANTOPRAZOLE SODIUM 40 MG PO TBEC: 40.0000 mg | DELAYED_RELEASE_TABLET | Freq: Every day | ORAL | Status: DC

## 2021-01-20 MED ORDER — POTASSIUM CHLORIDE 20 MEQ PO PACK
20.0000 meq | PACK | Freq: Two times a day (BID) | ORAL | Status: DC
  Administered 2021-01-20 – 2021-01-21 (×2): 20 meq
  Filled 2021-01-20 (×2): qty 1

## 2021-01-20 MED ORDER — FUROSEMIDE 40 MG PO TABS
40.0000 mg | ORAL_TABLET | Freq: Every day | ORAL | Status: DC
  Administered 2021-01-21: 40 mg
  Filled 2021-01-20: qty 1

## 2021-01-20 MED ORDER — HEPARIN (PORCINE) IN NACL 1000-0.9 UT/500ML-% IV SOLN
INTRAVENOUS | Status: DC | PRN
  Administered 2021-01-20: 500 mL

## 2021-01-20 MED ORDER — PHENYLEPHRINE HCL (PRESSORS) 10 MG/ML IV SOLN: INTRAVENOUS | Status: DC | PRN

## 2021-01-20 MED ORDER — SODIUM CHLORIDE 0.9 % IV SOLN: 250.0000 mL | INTRAVENOUS | Status: DC | PRN

## 2021-01-20 MED ORDER — ACETAMINOPHEN 325 MG PO TABS
650.0000 mg | ORAL_TABLET | ORAL | Status: DC | PRN
  Administered 2021-01-21 (×2): 650 mg via ORAL
  Filled 2021-01-20 (×2): qty 2

## 2021-01-20 MED ORDER — CHLORHEXIDINE GLUCONATE 0.12 % MT SOLN: 15.0000 mL | Freq: Once | OROMUCOSAL | Status: DC

## 2021-01-20 MED ORDER — POLYETHYLENE GLYCOL 3350 17 G PO PACK
17.0000 g | PACK | Freq: Every day | ORAL | Status: DC
  Administered 2021-01-21: 17 g
  Filled 2021-01-20: qty 1

## 2021-01-20 MED ORDER — DOPAMINE-DEXTROSE 3.2-5 MG/ML-% IV SOLN
INTRAVENOUS | Status: DC | PRN
  Administered 2021-01-20: 10 ug/kg/min via INTRAVENOUS

## 2021-01-20 MED ORDER — SODIUM CHLORIDE 0.9% FLUSH
3.0000 mL | Freq: Two times a day (BID) | INTRAVENOUS | Status: DC
  Administered 2021-01-20 – 2021-01-24 (×7): 3 mL via INTRAVENOUS

## 2021-01-20 SURGICAL SUPPLY — 18 items
CATH MITRA STEERABLE GUIDE (CATHETERS) ×2 IMPLANT
CLIP MITRA G4 DELIVERY SYS XTW (Clip) ×2 IMPLANT
CLOSURE PERCLOSE PROSTYLE (VASCULAR PRODUCTS) ×4 IMPLANT
ELECT DEFIB PAD ADLT CADENCE (PAD) ×2 IMPLANT
KIT DILATOR VASC 18G NDL (KITS) ×2 IMPLANT
KIT HEART LEFT (KITS) ×4 IMPLANT
KIT MICROPUNCTURE NIT STIFF (SHEATH) ×2 IMPLANT
KIT VERSACROSS LRG ACCESS (CATHETERS) ×2 IMPLANT
PACK CARDIAC CATHETERIZATION (CUSTOM PROCEDURE TRAY) ×2 IMPLANT
SHEATH PINNACLE 8F 10CM (SHEATH) ×2 IMPLANT
SHEATH PROBE COVER 6X72 (BAG) ×2 IMPLANT
SHIELD RADPAD SCOOP 12X17 (MISCELLANEOUS) ×2 IMPLANT
STOPCOCK MORSE 400PSI 3WAY (MISCELLANEOUS) ×10 IMPLANT
SYSTEM MITRACLIP G4 (SYSTAGENIX WOUND MANAGEMENT) ×2 IMPLANT
TRANSDUCER W/STOPCOCK (MISCELLANEOUS) ×2 IMPLANT
TUBING ART PRESS 72  MALE/FEM (TUBING) ×3
TUBING ART PRESS 72 MALE/FEM (TUBING) ×3 IMPLANT
TUBING CIL FLEX 10 FLL-RA (TUBING) ×2 IMPLANT

## 2021-01-20 SURGICAL SUPPLY — 8 items
ELECT DEFIB PAD ADLT CADENCE (PAD) ×1 IMPLANT
KIT CV 3L 7FR 20CM SULFAFREE (SET/KITS/TRAYS/PACK) ×1 IMPLANT
MAT PREVALON FULL STRYKER (MISCELLANEOUS) ×2 IMPLANT
PACK CARDIAC CATHETERIZATION (CUSTOM PROCEDURE TRAY) ×1 IMPLANT
PROTECTION STATION PRESSURIZED (MISCELLANEOUS) ×2
STATION PROTECTION PRESSURIZED (MISCELLANEOUS) IMPLANT
TRANSDUCER W/MONITORING KIT (MISCELLANEOUS) ×1 IMPLANT
TRAY PERICARDIOCENTESIS 6FX60 (TRAY / TRAY PROCEDURE) ×1 IMPLANT

## 2021-01-20 NOTE — Transfer of Care (Signed)
Immediate Anesthesia Transfer of Care Note  Patient: Teresa Hughes  Procedure(s) Performed: MITRAL VALVE REPAIR TRANSESOPHAGEAL ECHOCARDIOGRAM (TEE)  Patient Location: Cath Lab  Anesthesia Type:General  Level of Consciousness: drowsy and patient cooperative  Airway & Oxygen Therapy: Patient Spontanous Breathing and Patient connected to nasal cannula oxygen  Post-op Assessment: Report given to RN  Post vital signs: Reviewed and stable  Last Vitals:  Vitals Value Taken Time  BP 153/67 01/20/21 1252  Temp    Pulse 71 01/20/21 1254  Resp 21 01/20/21 1254  SpO2 99 % 01/20/21 1254  Vitals shown include unvalidated device data.  Last Pain:  Vitals:   01/20/21 0911  TempSrc:   PainSc: 0-No pain      Patients Stated Pain Goal: 3 (97/87/76 [id number])  Complications: No notable events documented.

## 2021-01-20 NOTE — Anesthesia Postprocedure Evaluation (Signed)
Anesthesia Post Note  Patient: Teresa Hughes  Procedure(s) Performed: MITRAL VALVE REPAIR TRANSESOPHAGEAL ECHOCARDIOGRAM (TEE)     Patient location during evaluation: PACU Anesthesia Type: General Level of consciousness: sedated Pain management: pain level controlled Vital Signs Assessment: post-procedure vital signs reviewed and stable Respiratory status: spontaneous breathing and respiratory function stable Cardiovascular status: stable Postop Assessment: no apparent nausea or vomiting Anesthetic complications: no   No notable events documented.  Last Vitals:  Vitals:   01/20/21 1400 01/20/21 1405  BP: (!) 100/49 96/64  Pulse: (!) 28 (!) 49  Resp: (!) 24 15  Temp:    SpO2: 94% 97%    Last Pain:  Vitals:   01/20/21 1257  TempSrc:   PainSc: 0-No pain                 Reeanna Acri DANIEL

## 2021-01-20 NOTE — Code Documentation (Addendum)
Arrived to scene and patient noted to have SBP 50s. Per MD Johnsie Cancel, pt had a mitral clipping. Post procedure, pt was noted to have some mild left sided symptoms with arm and a slight facial droop. Code Stroke called.   After Code Stroke, pt became hemodynamically unstable. SBP in 50s. Pt needed to be stabilized, intubated, and ECHO completed. Unable to complete NIHSS on patient due to situation  MD Sethi at the bedside. Evaluated situation. Pt is unable to get tPA due to recent procedure and receiving Heparin during the procedure per MD Leonie Man. Pt is unstable and cardiology will continue care at this time. Neurology to consult once patient is stabilized and cardiac situation cleared.

## 2021-01-20 NOTE — Progress Notes (Signed)
Post-Procedure Note: Events noted as outlined in Dr. Kyla Balzarine notes.  The patient left the hybrid Cath Lab in stable condition after her MitraClip procedure.  While she was in the recovery area, she developed severe hypotension and was found to have a large pericardial effusion consistent with hemopericardium.  The patient was resuscitated as he outlines and then was taken to the Cath Lab emergently for pericardiocentesis.  400 cc of blood was drained from the pericardium by Dr. Irish Lack.  The patient immediately stabilized and came off of inotropic agents.  She transferred back to the CV-ICU in stable condition with a pericardial drain in place.  I have seen her both in the Cath Lab and in the CCU after her return there.  She is normotensive off of all vasopressor agents with good oxygen saturations greater than 92% with mechanical ventilation.  I have updated her daughter Rise Paganini in the CVICU waiting room.  I have tried to call her other daughter, Butch Penny, but I get a busy signal.  We will continue to follow closely.  Appreciate the care of both the cardiology and CCM teams.  [name] 01/20/2021 4:44 PM

## 2021-01-20 NOTE — Progress Notes (Signed)
Patient ID: Teresa Hughes, female   DOB: 02-Dec-1940, 80 y.o.   MRN: [id number]  Called to see patient in cath lab recovery for ? Code stroke and hypotension Patient just had mitral clip procedure.   Rhythm stable SR/PAF rates 80-110 BP only 50-72 mmHg systolic with acute drop  Resuscitated with two wide bore iv's fluid Dopamine 5 ug/min started  Two rounds epi given  Patient never required CPR Pale Venous cath site fine with no hematoma Neuro non focal moving all extremities  CCM called to intubate and protect airway RFV line started for dopamine  TTE initially showed pericardial effusion  Preserved EF and mitral clip still in good position  With trivial MR Shunt from transeptal still just left to right  Sats 100% after intubation with full BS bilaterally  TEE probe passed after intubation Large pericardial effusion compared to procedure With tamponade and fluid around LAA as well Moderate AR no change from procedure  Dr Irish Lack notified for emergent pericardiocentesis Patient to cath lab one intubated in stable condition With SR rate 90 and BP 128/78 mmHg  Appreciate Dr Lake Bells and Kary Kos assistance In resuscitation  Family daughter Rise Paganini updated Dr Burt Knack now in cath lab one  Critical care time 1:15 hour including imaging  Jenkins Rouge MD [facility]

## 2021-01-20 NOTE — Progress Notes (Signed)
Pt seen again this evening. Remains hemodynamically stable. Bedside echo performed with handheld device, shows small residual effusion anterior to the heart. Discussed with Dr Lake Bells and he plans to give reversal agent to rocuronium so that her neuro status can be better evaluated since she has remained minimally responsive. Updated daughter, Butch Penny, who is now present, and brought her to the patient's bedside.   Teresa Hughes .td 7:18 PM

## 2021-01-20 NOTE — Anesthesia Procedure Notes (Signed)
Arterial Line Insertion Start/End6/04/2021 9:56 AM, 01/20/2021 10:06 AM Performed by: Duane Boston, MD  Patient location: Pre-op. Preanesthetic checklist: patient identified, IV checked, site marked, risks and benefits discussed, surgical consent, monitors and equipment checked, pre-op evaluation, timeout performed and anesthesia consent Lidocaine 1% used for infiltration Left, radial was placed Catheter size: 20 Fr Hand hygiene performed  and maximum sterile barriers used   Attempts: 1 Procedure performed using ultrasound guided technique. Ultrasound Notes:anatomy identified, needle tip was noted to be adjacent to the nerve/plexus identified, no ultrasound evidence of intravascular and/or intraneural injection and image(s) printed for medical record Following insertion, dressing applied. Post procedure assessment: normal and unchanged  Post procedure complications: second provider assisted. Patient tolerated the procedure well with no immediate complications.

## 2021-01-20 NOTE — Anesthesia Procedure Notes (Signed)
Procedure Name: Intubation Date/Time: 01/20/2021 10:47 AM Performed by: Barrington Ellison, CRNA Pre-anesthesia Checklist: Patient identified, Emergency Drugs available, Suction available and Patient being monitored Patient Re-evaluated:Patient Re-evaluated prior to induction Oxygen Delivery Method: Circle System Utilized Preoxygenation: Pre-oxygenation with 100% oxygen Induction Type: IV induction Ventilation: Mask ventilation without difficulty and Oral airway inserted - appropriate to patient size Laryngoscope Size: Mac and 3 Grade View: Grade I Tube type: Oral Tube size: 7.0 mm Number of attempts: 1 Airway Equipment and Method: Stylet and Oral airway Placement Confirmation: ETT inserted through vocal cords under direct vision, positive ETCO2 and breath sounds checked- equal and bilateral Secured at: 20 cm Tube secured with: Tape Dental Injury: Teeth and Oropharynx as per pre-operative assessment

## 2021-01-20 NOTE — Progress Notes (Signed)
   I was asked to evaluate patient in the cath lab holding area around 2:25pm following MitraClip after patient started to complain of throat pain and BP was dropping. When patient was hypotensive with systolic BP in the 76L to 80s. 500cc bolus of normal saline. She appear uncomfortable and altered. However, she could not tell me exactly where she was having pain. She was not responding to verbal commands. She then developed some left sided deficits and possible mild left sided facial drip. Code Stroke was called. However, then patient became more hypotensive and developed respiratory distress. Heart sounds were very difficult to appreciated on exam. No crackles were noted. Dr. Johnsie Cancel came to bedside and STAT Echo was ordered. Patient BVM and Critical Care was consulted to help place PICC line and intubate patient.  Patient was received more IV fluids and was started on Dopamine (also received 1 amp of Epinephrine). Code Stroke was cancelled for the time being to allow for acute cardiovascular issues to be addressed. There was concern for cardiac tamponade so Dr. Johnsie Cancel performed bedside TEE after patient was intubated which confirmed large pericardial effusion. She was then taken to the cath labor emergent pericardiocentesis.   Please see other code note for more details.  Darreld Mclean, PA-C 01/20/2021 3:25 PM

## 2021-01-20 NOTE — Procedures (Signed)
Central Venous Catheter Insertion Procedure Note  Teresa Hughes  [id number]  05-Jun-1941  Date:01/20/21  Time:5:01 PM   Provider Performing:Pete Johnette Abraham Kary Kos   Procedure: Insertion of Non-tunneled Central Venous Catheter(36556) without US guidance  Indication(s) Medication administration and Difficult access  Consent Unable to obtain consent due to emergent nature of procedure.  Anesthesia Topical only with 1% lidocaine   Timeout Verified patient identification, verified procedure, site/side was marked, verified correct patient position, special equipment/implants available, medications/allergies/relevant history reviewed, required imaging and test results available.  Sterile Technique Maximal sterile technique including full sterile barrier drape, hand hygiene, sterile gown, sterile gloves, mask, hair covering, sterile ultrasound probe cover (if used).  Procedure Description Area of catheter insertion was cleaned with chlorhexidine and draped in sterile fashion.  Without real-time ultrasound guidance a central venous catheter was placed into the left femoral vein. Nonpulsatile blood flow and easy flushing noted in all ports.  The catheter was sutured in place and sterile dressing applied.  Complications/Tolerance None; patient tolerated the procedure well. Chest X-ray is ordered to verify placement for internal jugular or subclavian cannulation.   Chest x-ray is not ordered for femoral cannulation.  EBL Minimal  Specimen(s) None Erick Colace ACNP-BC [place] Pager # [phone] OR # [phone] if no answer

## 2021-01-20 NOTE — Consult Note (Signed)
NAME:  Teresa Hughes, MRN:  [id number], DOB:  12-28-1940, LOS: 0 ADMISSION DATE:  01/20/2021, CONSULTATION DATE:  01/20/21 REFERRING MD:  Burt Knack - cards, CHIEF COMPLAINT:  Shock, respiratory failure    History of Present Illness:   80 yo F PMH mitral insufficiency Afib (on eliquis last dose 6/7 and plavix continued with recent stent 08/6965) Diastolic HF, prior CVA who presented 6/9 for mitraclip. In cath lab holding pt became hypotensive, was started on 565ml NS. Complained of generalized pain as well as stroke pain, then noted to have acute L sided weakness, L facial droop, and altered mental status. A code stroke was called, but patient rapidly declined becoming more hypoxic and requiring BVM. At time of arrival, neuro at bedside-- pt currently too unstable for CT H. Critical care consulted urgently for intubation and shock.   Started on dopamine, NE. 2 amp bicarb pushed. On ultrasound, appears to have tamponade physiology. Following intubation and line placement will go emergently to cath lab   Pertinent  Medical History  Mitral insufficiency  Diastolic heart failure Afib  CVA DDD Esophageal stricture   Significant Hospital Events: Including procedures, antibiotic start and stop dates in addition to other pertinent events   6/9 mitraclip. Acute hypotension and L sided deficits, progressive hypoxia. CCM consulted for intubation and further care   Interim History / Subjective:  Acute hypotension, hypoxia, hypotension, L sided weakness.    Objective   Blood pressure 96/64, pulse (!) 49, temperature (!) 97.3 F (36.3 C), resp. rate 15, height 5\' 6"  (1.676 m), weight 55.4 kg, SpO2 97 %.        Intake/Output Summary (Last 24 hours) at 01/20/2021 1445 Last data filed at 01/20/2021 1230 Gross per 24 hour  Intake 800 ml  Output 30 ml  Net 770 ml   Filed Weights   01/20/21 0854  Weight: 55.4 kg    Examination: General: Critically ill appearing elderly F HENT: NCAT ETT secure  anicteric sclera  Lungs: CTAb symmetrical chest expansion, mechanically ventilated  Cardiovascular:  tachycardic with ventricular ectopy s1s2 no rgm  Abdomen: soft round ndnt  Extremities: no acute joint deformity. Mild distal cyanosis. No clubbing Neuro: sedated, paralyzed  GU: defer   Labs/imaging that I have personally reviewed  (right click and "Reselect all SmartList Selections" daily)  CBC - WBC 13 hgb 10.7  Resolved Hospital Problem list     Assessment & Plan:     Acute Hypoxic Respiratory Failure -Developed post mitral valve repair P: Continue ventilator support with lung protective strategies Wean PEEP and FiO2 for sats greater than 90%. Head of bed elevated 30 degrees. Plateau pressures less than 30 cm H20. Follow intermittent chest x-ray and ABG.   SAT/SBT when appropriate  Ensure adequate pulmonary hygiene  Follow cultures VAP bundle in place (dexmed, PRN fent) PAD protocol   Obstructive shock Cardiac tamponade post mitral repair -Seen on emergent bedside ECHO in cath lab holding POD0 mitraclip P: Emergent;y to cath lab, intervention per cards Epi, dopa gtt Continuous telemetry Last dose of Eliquis 6/7 Last dose of Plavix 9 Stat type and screen  Severe mitral regurgitation - S/P mitraclip 6/9 Systolic congestive heart failure -EF 50-55% on ECHO 09/14/2020 P: Primary management per cardiology Continuous telemetry Strict intake and output Repeat ECHO pending   Acute encephalopathy -?r/t  possible CVA vs hypoxia, hypoperfusion  Acute L sided weakness/ concern for code stroke -Seen post mitral repair with new onset left facial droop with altered mental status.  Hx of prior CVA with cerebral thrombus 04/2012 P: Management per neurology Maintain neuro protective measures; goal for eurothermia, euglycemia, eunatermia, normoxia, and PCO2 goal of 35-40 STAT head CT per code stroke   Chronic A-fib -Rate controlled with digoxin and lopressor,  anticoagulated with Eliquis and Plavix P: Continuous telemetry Hold anticoagulation   Evolving acute kidney injury P: Follow renal function  Monitor urine output Trend Bmet Avoid nephrotoxins Ensure adequate renal perfusion  IV hydration   Hx of HTN Hx of HLD -Home medications include Digoxin, Lasix, Lopressor, and Crestor P: Hold home medication due to hemodynamic instability     Best practice (right click and "Reselect all SmartList Selections" daily)  Diet:  NPO Pain/Anxiety/Delirium protocol (if indicated): Yes (RASS goal -1) VAP protocol (if indicated): Yes DVT prophylaxis: SCD GI prophylaxis: PPI Glucose control:  SSI No Central venous access:  Yes, and it is still needed Arterial line:  Yes, and it is still needed Foley:  N/A Mobility:  bed rest  PT consulted: N/A Last date of multidisciplinary goals of care discussion [pending] Code Status:  full code Disposition: ICU   Labs   CBC: Recent Labs  Lab 01/18/21 1053  WBC 8.5  HGB 13.2  HCT 39.0  MCV 94.7  PLT 858    Basic Metabolic Panel: Recent Labs  Lab 01/18/21 1053  NA 140  K 4.1  CL 106  CO2 23  GLUCOSE 125*  BUN 17  CREATININE 1.02*  CALCIUM 9.2   GFR: Estimated Creatinine Clearance: 37.2 mL/min (A) (by C-G formula based on SCr of 1.02 mg/dL (H)). Recent Labs  Lab 01/18/21 1053  WBC 8.5    Liver Function Tests: Recent Labs  Lab 01/18/21 1053  AST 24  ALT 17  ALKPHOS 58  BILITOT 1.0  PROT 6.4*  ALBUMIN 3.8   No results for input(s): LIPASE, AMYLASE in the last 168 hours. No results for input(s): AMMONIA in the last 168 hours.  ABG    Component Value Date/Time   PHART 7.435 01/18/2021 1118   PCO2ART 35.7 01/18/2021 1118   PO2ART 95.1 01/18/2021 1118   HCO3 23.5 01/18/2021 1118   TCO2 27 10/29/2020 0803   ACIDBASEDEF 0.2 01/18/2021 1118   O2SAT 97.8 01/18/2021 1118     Coagulation Profile: Recent Labs  Lab 01/18/21 1053  INR 1.3*    Cardiac Enzymes: No  results for input(s): CKTOTAL, CKMB, CKMBINDEX, TROPONINI in the last 168 hours.  HbA1C: No results found for: HGBA1C  CBG: Recent Labs  Lab 01/20/21 1435  GLUCAP 167*    Review of Systems:   Unable to obtain due to encephalopathy  Past Medical History:  She,  has a past medical history of Acute kidney injury ([place]), Anemia of chronic disease, Aneurysm ([place]) (07/01/2018), Anxiety, Asthma, Cerebral thrombosis with cerebral infarction [facility]) (05/07/2012), CHF (congestive heart failure) ([place]), Chronic anticoagulation (08/28/2016), Chronic atrial fibrillation ([place]) (08/28/2016), Chronic insomnia, Chronic renal disease, Coronary artery disease, DDD (degenerative disc disease), cervical, Dyspnea, Dysrhythmia, Esophageal stricture, GERD (gastroesophageal reflux disease), Gout, Heart murmur, Hiatal hernia, High risk medication use (08/28/2016), Hypercalcemia, Hypertension, Hypertensive heart disease without heart failure (08/28/2016), Late effects of CVA (cerebrovascular accident), Macular degeneration, dry, Osteopenia, Osteoporosis, Other hyperlipidemia (08/28/2016), Paroxysmal SVT (supraventricular tachycardia) ([place]), Pneumonia, Sleep apnea, and Vitamin D deficiency.   Surgical History:   Past Surgical History:  Procedure Laterality Date   CARDIAC CATHETERIZATION     CATARACT EXTRACTION Bilateral    Right 04/02/17, Left 9/22018   CORONARY ATHERECTOMY N/A 12/22/2020  Procedure: CORONARY ATHERECTOMY;  Surgeon: [name], MD;  Location: [place] CV LAB;  Service: Cardiovascular;  Laterality: N/A;   CORONARY STENT INTERVENTION N/A 12/22/2020   Procedure: CORONARY STENT INTERVENTION;  Surgeon: [name], MD;  Location: [place] CV LAB;  Service: Cardiovascular;  Laterality: N/A;   ERCP W/ SPHINCTEROTOMY AND BALLOON DILATION     EYE SURGERY     HEMORRHOID SURGERY  2016   RIGHT/LEFT HEART CATH AND CORONARY ANGIOGRAPHY N/A 10/29/2020   Procedure: RIGHT/LEFT HEART CATH AND CORONARY  ANGIOGRAPHY;  Surgeon: [name], MD;  Location: [place] CV LAB;  Service: Cardiovascular;  Laterality: N/A;   TEE WITHOUT CARDIOVERSION N/A 09/14/2020   Procedure: TRANSESOPHAGEAL ECHOCARDIOGRAM (TEE);  Surgeon: Buford Dresser, MD;  Location: [facility] ENDOSCOPY;  Service: Cardiovascular;  Laterality: N/A;     Social History:   reports that she has never smoked. Her smokeless tobacco use includes snuff. She reports that she does not drink alcohol and does not use drugs.   Family History:  Her family history includes Aneurysm in her maternal grandmother; Congestive Heart Failure in her father; Diabetes in her mother; Heart attack in her brother, father, and sister; Heart disease in her brother, father, maternal grandfather, and maternal grandmother; Hypertension in her brother and mother; Skin cancer in her brother; Stroke in her maternal grandfather and mother.   Allergies Allergies  Allergen Reactions   Propranolol Other (See Comments)    Bradycardia   Sulfamethoxazole Swelling   Codeine Nausea And Vomiting   Latex Itching and Rash     Home Medications  Prior to Admission medications   Medication Sig Start Date End Date Taking? Authorizing Provider  alendronate (FOSAMAX) 70 MG tablet Take 70 mg by mouth every Sunday. Take with a full glass of water on an empty stomach.   Yes [provider]  allopurinol (ZYLOPRIM) 300 MG tablet Take 300 mg by mouth at bedtime.   Yes [provider]  apixaban (ELIQUIS) 2.5 MG TABS tablet Take 1 tablet (2.5 mg total) by mouth 2 (two) times daily. 10/30/20  Yes [name], MD  Biotin 5 MG CAPS Take 5 mg by mouth daily.   Yes [provider]  Cholecalciferol (VITAMIN D3) 25 MCG (1000 UT) CAPS Take 1,000 Units by mouth daily.   Yes [provider]  clopidogrel (PLAVIX) 75 MG tablet Take 1 tablet (75 mg total) by mouth daily. 12/16/20  Yes Eileen Stanford, PA-C  digoxin (LANOXIN) 0.125 MG tablet Take 0.0625  mg by mouth daily.   Yes [provider]  furosemide (LASIX) 40 MG tablet Take 40 mg by mouth daily.   Yes [provider]  metoprolol tartrate (LOPRESSOR) 50 MG tablet Take 50 mg by mouth 2 (two) times daily.   Yes [provider]  mirtazapine (REMERON) 15 MG tablet Take 7.5 mg by mouth at bedtime. 08/27/20  Yes [provider]  Multiple Vitamin (MULTIVITAMIN WITH MINERALS) TABS tablet Take 1 tablet by mouth daily.   Yes [provider]  Multiple Vitamins-Minerals (PRESERVISION AREDS 2) CAPS Take 1 capsule by mouth 2 (two) times daily.   Yes [provider]  omega-3 acid ethyl esters (LOVAZA) 1 g capsule Take 2 g by mouth 2 (two) times daily. 06/04/20  Yes [provider]  pantoprazole (PROTONIX) 40 MG tablet Take 1 tablet (40 mg total) by mouth daily. 12/16/20  Yes Eileen Stanford, PA-C  Polyethyl Glycol-Propyl Glycol (SYSTANE OP) Place 1 drop into both eyes 2 (two)  times daily.   Yes [provider]  potassium chloride SA (KLOR-CON) 20 MEQ tablet Take 20 mEq by mouth 2 (two) times daily.   Yes [provider]  rosuvastatin (CRESTOR) 20 MG tablet Take 1 tablet (20 mg total) by mouth daily. 12/23/20 06/21/21 Yes Cheryln Manly, NP     Critical care time: 50 minutes      CRITICAL CARE Performed by: Cristal Generous   Total critical care time: 50 minutes  Critical care time was exclusive of separately billable procedures and treating other patients.  Critical care was necessary to treat or prevent imminent or life-threatening deterioration.  Critical care was time spent personally by me on the following activities: development of treatment plan with patient and/or surrogate as well as nursing, discussions with consultants, evaluation of patient's response to treatment, examination of patient, obtaining history from patient or surrogate, ordering and performing treatments and interventions, ordering and review  of laboratory studies, ordering and review of radiographic studies, pulse oximetry and re-evaluation of patient's condition.  Eliseo Gum MSN, AGACNP-BC [place] for pager  01/20/2021, 3:31 PM

## 2021-01-20 NOTE — Progress Notes (Signed)
Pt transported from [place] 20 to CT1 and back on the ventilator. RT, RN and transport accompanied pt. VSS throughout. RT will continue to monitor and be available.

## 2021-01-20 NOTE — Progress Notes (Signed)
[facility] P[facility]M  Came back to evaluate the patient because she is not moving any extremities. Has a slight flicker of tongue on physical exam.  No response to painful stimuli. She received 70mg  rocuronium at 1445.  Head CT negative. It is unclear if she is having an acute stroke or not now or if this is still an effect of the rocuronium. Will give a neuromuscular blockade reversal agent.  If not moving after that will need to call code stroke again.  Roselie Awkward, MD [place] P[facility]M Pager: [phone] Cell: [phone] If no response, please call [phone] until 7pm After 7:00 pm call Elink  [phone]

## 2021-01-20 NOTE — Telephone Encounter (Signed)
Error

## 2021-01-20 NOTE — Anesthesia Preprocedure Evaluation (Addendum)
Anesthesia Evaluation  Patient identified by MRN, date of birth, ID band Patient awake    Reviewed: Allergy & Precautions, NPO status , Patient's Chart, lab work & pertinent test results  History of Anesthesia Complications Negative for: history of anesthetic complications  Airway Mallampati: I  TM Distance: >3 FB Neck ROM: Full    Dental  (+) Edentulous Upper, Dental Advisory Given, Missing,    Pulmonary neg pulmonary ROS,    Pulmonary exam normal        Cardiovascular hypertension, Pt. on medications and Pt. on home beta blockers +CHF  + dysrhythmias Atrial Fibrillation and Supra Ventricular Tachycardia  Rhythm:Irregular Rate:Normal + Systolic murmurs TTE 78/24: EF 60-65%, moderately elevated PASP, severe LAE, moderate to severe MR, moderate  holosystolic prolapse of the middle scallop of the posterior leaflet of the mitral valve, mild flail of posterior leaflet, mild to moderate TR, mild to moderate AR, mild dilatation of the ascending aorta measuring 39 mm    Neuro/Psych Anxiety CVA    GI/Hepatic Neg liver ROS, hiatal hernia, GERD  Medicated and Controlled,  Endo/Other  negative endocrine ROS  Renal/GU Renal InsufficiencyRenal disease  negative genitourinary   Musculoskeletal  (+) Arthritis ,   Abdominal   Peds  Hematology negative hematology ROS (+)   Anesthesia Other Findings Day of surgery medications reviewed with patient.  Reproductive/Obstetrics negative OB ROS                            Anesthesia Physical  Anesthesia Plan  ASA: 4  Anesthesia Plan: General   Post-op Pain Management:    Induction: Intravenous  PONV Risk Score and Plan: 3 and Treatment may vary due to age or medical condition, Ondansetron and Dexamethasone  Airway Management Planned: Oral ETT  Additional Equipment: Arterial line  Intra-op Plan:   Post-operative Plan: Extubation in OR  Informed  Consent: I have reviewed the patients History and Physical, chart, labs and discussed the procedure including the risks, benefits and alternatives for the proposed anesthesia with the patient or authorized representative who has indicated his/her understanding and acceptance.     Dental advisory given  Plan Discussed with: Anesthesiologist and CRNA  Anesthesia Plan Comments:        Anesthesia Quick Evaluation

## 2021-01-20 NOTE — Progress Notes (Signed)
[facility]  Now moving around after receiving NMBD reversal agent Restart precedex as blood pressure tolerates Plan extubation in AM if no recurrent bleeding  Roselie Awkward, MD [place] [facility] Pager: [phone] Cell: [phone] If no response, please call [phone] until 7pm After 7:00 pm call Elink  [phone]

## 2021-01-20 NOTE — Consult Note (Signed)
NEUROLOGY CONSULTATION NOTE   Date of service: January 20, 2021 Patient Name: Teresa Hughes MRN:  [id number] DOB:  [birth year] Reason for consult: "acute change in mentation after elective mitral valve clipping" Requesting Provider: [name], MD _ _ _   _ __   _ __ _ _  __ __   _ __   __ _  History of Present Illness  Teresa Hughes is a 80 y.o. female with PMH significant for mitral insufficiency, HLD, macular degneration, GERD, gout, prior R MCA stroke in the insular region, known 22mm basilar aneurysm and fibromuscular dysplasia of BL cervical ICAs who is s/p mitral valve clipping. In the recovery, had change in mentation and decline in BP about 1 hours after procedure. Per notes, had left sided symptoms and a stroke code was activated.  Stroke code could not be completed due to patient being too unstable. She was deemed not a tPA candidate 2/2 recent procedure. She was started on pressors and plan to stabilize cardiac tamponade and pericardial drain, followed by neuro evaluation. CT Head w/o contrast with no acute abnormality.  She is intubated on my evaluation but is awake and follows commands. Unable to provide any meaningful history secondary to intubation.  mRS: 0 tPA: contraindicated 2/2 cardiac tamponade Thrombectomy: Low suspicion for an LVO NIHSS components Score: Comment  1a Level of Conscious 0[x]  1[]  2[]  3[]      1b LOC Questions 0[]  1[x]  2[]       1c LOC Commands 0[x]  1[]  2[]       2 Best Gaze 0[x]  1[]  2[]       3 Visual 0[x]  1[]  2[]  3[]      4 Facial Palsy 0[x]  1[]  2[]  3[]      5a Motor Arm - left 0[x]  1[]  2[]  3[]  4[]  UN[]    5b Motor Arm - Right 0[x]  1[]  2[]  3[]  4[]  UN[]    6a Motor Leg - Left 0[x]  1[]  2[]  3[]  4[]  UN[]    6b Motor Leg - Right 0[x]  1[]  2[]  3[]  4[]  UN[]    7 Limb Ataxia 0[x]  1[]  2[]  3[]  UN[]     8 Sensory 0[x]  1[]  2[]       9 Best Language 0[]  1[]  2[]  3[]  UN[x]     10 Dysarthria 0[]  1[]  2[x]  UN[]      11 Extinct. and Inattention 0[x]  1[]  2[]       TOTAL: 3       ROS   Unable to obtain a detailed review of systems secondary to intubation.  Past History   Past Medical History:  Diagnosis Date  . Acute kidney injury ([place])   . Anemia of chronic disease   . Aneurysm ([place]) 07/01/2018  . Anxiety   . Asthma   . Cerebral thrombosis with cerebral infarction ([place]) 05/07/2012  . CHF (congestive heart failure) ([place])   . Chronic anticoagulation 08/28/2016  . Chronic atrial fibrillation ([place]) 08/28/2016  . Chronic insomnia   . Chronic renal disease   . Coronary artery disease   . DDD (degenerative disc disease), cervical   . Dyspnea   . Dysrhythmia   . Esophageal stricture   . GERD (gastroesophageal reflux disease)   . Gout   . Heart murmur   . Hiatal hernia   . High risk medication use 08/28/2016  . Hypercalcemia   . Hypertension   . Hypertensive heart disease without heart failure 08/28/2016  . Late effects of CVA (cerebrovascular accident)   . Macular degeneration, dry   . Osteopenia   . Osteoporosis   .  Other hyperlipidemia 08/28/2016  . Paroxysmal SVT (supraventricular tachycardia) ([place])   . Pneumonia   . Sleep apnea   . Vitamin D deficiency    Past Surgical History:  Procedure Laterality Date  . CARDIAC CATHETERIZATION    . CATARACT EXTRACTION Bilateral    Right 04/02/17, Left 9/22018  . CORONARY ATHERECTOMY N/A 12/22/2020   Procedure: CORONARY ATHERECTOMY;  Surgeon: [name], MD;  Location: [facility];  Service: Cardiovascular;  Laterality: N/A;  . CORONARY STENT INTERVENTION N/A 12/22/2020   Procedure: CORONARY STENT INTERVENTION;  Surgeon: [name], MD;  Location: [facility];  Service: Cardiovascular;  Laterality: N/A;  . ERCP W/ SPHINCTEROTOMY AND BALLOON DILATION    . EYE SURGERY    . HEMORRHOID SURGERY  2016  . RIGHT/LEFT HEART CATH AND CORONARY ANGIOGRAPHY N/A 10/29/2020   Procedure: RIGHT/LEFT HEART CATH AND CORONARY ANGIOGRAPHY;  Surgeon: [name], MD;  Location: [facility];   Service: Cardiovascular;  Laterality: N/A;  . TEE WITHOUT CARDIOVERSION N/A 09/14/2020   Procedure: TRANSESOPHAGEAL ECHOCARDIOGRAM (TEE);  Surgeon: Buford Dresser, MD;  Location: [facility] END[facility];  Service: Cardiovascular;  Laterality: N/A;   Family History  Problem Relation Age of Onset  . Stroke Mother   . Hypertension Mother   . Diabetes Mother   . Heart disease Father   . Heart attack Father   . Congestive Heart Failure Father   . Heart attack Sister   . Skin cancer Brother   . Aneurysm Maternal Grandmother        Abdominal  . Heart disease Maternal Grandmother   . Stroke Maternal Grandfather   . Heart disease Maternal Grandfather   . Hypertension Brother   . Heart attack Brother   . Heart disease Brother    Social History   Socioeconomic History  . Marital status: Unknown    Spouse name: Not on file  . Number of children: Not on file  . Years of education: Not on file  . Highest education level: Not on file  Occupational History  . Not on file  Tobacco Use  . Smoking status: Never  . Smokeless tobacco: Current    Types: Snuff  Vaping Use  . Vaping Use: Never used  Substance and Sexual Activity  . Alcohol use: Never  . Drug use: Never  . Sexual activity: Not on file  Other Topics Concern  . Not on file  Social History Narrative  . Not on file   Social Determinants of Health   Financial Resource Strain: Not on file  Food Insecurity: Not on file  Transportation Needs: Not on file  Physical Activity: Not on file  Stress: Not on file  Social Connections: Not on file   Allergies  Allergen Reactions  . Propranolol Other (See Comments)    Bradycardia  . Sulfamethoxazole Swelling  . Codeine Nausea And Vomiting  . Latex Itching and Rash    Medications   Medications Prior to Admission  Medication Sig Dispense Refill Last Dose  . alendronate (FOSAMAX) 70 MG tablet Take 70 mg by mouth every Sunday. Take with a full glass of water on an empty stomach.    01/19/2021  . allopurinol (ZYLOPRIM) 300 MG tablet Take 300 mg by mouth at bedtime.   01/19/2021  . apixaban (ELIQUIS) 2.5 MG TABS tablet Take 1 tablet (2.5 mg total) by mouth 2 (two) times daily. 60 tablet 11 01/18/2021  . Biotin 5 MG CAPS Take 5 mg by mouth daily.  01/19/2021  . Cholecalciferol (VITAMIN D3) 25 MCG (1000 UT) CAPS Take 1,000 Units by mouth daily.   01/19/2021  . clopidogrel (PLAVIX) 75 MG tablet Take 1 tablet (75 mg total) by mouth daily. 30 tablet 11 01/20/2021 at 0800  . digoxin (LANOXIN) 0.125 MG tablet Take 0.0625 mg by mouth daily.   01/20/2021 at 0800  . furosemide (LASIX) 40 MG tablet Take 40 mg by mouth daily.   01/19/2021  . metoprolol tartrate (LOPRESSOR) 50 MG tablet Take 50 mg by mouth 2 (two) times daily.   01/20/2021 at 0800  . mirtazapine (REMERON) 15 MG tablet Take 7.5 mg by mouth at bedtime.   01/20/2021 at 0800  . Multiple Vitamin (MULTIVITAMIN WITH MINERALS) TABS tablet Take 1 tablet by mouth daily.   01/19/2021  . Multiple Vitamins-Minerals (PRESERVISION AREDS 2) CAPS Take 1 capsule by mouth 2 (two) times daily.   01/19/2021  . omega-3 acid ethyl esters (LOVAZA) 1 g capsule Take 2 g by mouth 2 (two) times daily.   01/20/2021 at 0800  . pantoprazole (PROTONIX) 40 MG tablet Take 1 tablet (40 mg total) by mouth daily. 30 tablet 11 01/20/2021 at 0800  . Polyethyl Glycol-Propyl Glycol (SYSTANE OP) Place 1 drop into both eyes 2 (two) times daily.   01/19/2021  . potassium chloride SA (KLOR-CON) 20 MEQ tablet Take 20 mEq by mouth 2 (two) times daily.   01/19/2021  . rosuvastatin (CRESTOR) 20 MG tablet Take 1 tablet (20 mg total) by mouth daily. 90 tablet 1 01/20/2021 at 0800     Vitals   Vitals:   01/20/21 1833 01/20/21 1835 01/20/21 1844 01/20/21 1900  BP: (!) 71/39 (!) 87/49 105/65 (!) 99/58  Pulse:  67 88 65  Resp: 18 18 (!) 22 18  Temp:      TempSrc:      SpO2:  97% 99% 100%  Weight:      Height:         Body mass index is 19.71 kg/m.  Physical Exam   General: Laying  comfortably in bed; in no acute distress.  HENT: Normal oropharynx and mucosa. Normal external appearance of ears and nose.  Neck: Supple, no pain or tenderness  CV: No JVD. No peripheral edema.  Pulmonary: Symmetric Chest rise. Breathing over vent. Abdomen: Soft to touch, non-tender.  Ext: No cyanosis, edema, or deformity  Skin: No rash. Normal palpation of skin.   Musculoskeletal: Normal digits and nails by inspection. No clubbing.   Neurologic Examination  Mental status/Cognition: Alert, oriented to self, month and year.  Unable to answer orientation questions due to intubation.  Good attention Speech/language: Comprehension intact.  Unable to evaluate for expressive aphasia due to intubation. cranial nerves:   CN II Pupils equal and reactive to light, no VF deficits   CN III,IV,VI EOM intact, no gaze preference or deviation, no nystagmus   CN V normal sensation in V1, V2, and V3 segments bilaterally   CN VII no asymmetry, no nasolabial fold flattening   CN VIII Turns haed towrds speech   CN IX & X Unable to assess due to intubation.   CN XI 5/5 head turn and 5/5 shoulder shrug bilaterally   CN XII Unable to assess due to intubation.   Motor:  Muscle bulk: poor, tone normal, pronator drift none tremor none Mvmt Root Nerve  Muscle Right Left Comments  SA C5/6 Ax Deltoid     EF C5/6 Mc Biceps 5 5   EE C6/7/8 Rad Triceps  5 5   WF C6/7 Med FCR     WE C7/8 PIN ECU     F Ab C8/T1 U ADM/FDI 5 5   HF L1/2/3 Fem Illopsoas 5 5   KE L2/3/4 Fem Quad     DF L4/5 D Peron Tib Ant 5 5   PF S1/2 Tibial Grc/Sol 5 5    Reflexes:  Right Left Comments  Pectoralis      Biceps (C5/6) 2 2   Brachioradialis (C5/6) 2 2    Triceps (C6/7) 2 2    Patellar (L3/4) 2 2    Achilles (S1)      Hoffman      Plantar     Jaw jerk    Sensation:  Light touch Intact throughout   Pin prick    Temperature    Vibration   Proprioception    Coordination/Complex Motor:  - Finger to Nose intact BL -  Heel to shin unable to assess. - Rapid alternating movement are normal - Gait: unable to assess 2/2 intubation.  Labs   CBC:  Recent Labs  Lab 01/18/21 1053 01/20/21 1504 01/20/21 1801  WBC 8.5 13.0*  --   HGB 13.2 10.7* 12.2  HCT 39.0 32.7* 36.0  MCV 94.7 96.7  --   PLT 170 162  --     Basic Metabolic Panel:  Lab Results  Component Value Date   NA 145 01/20/2021   K 2.9 (L) 01/20/2021   CO2 19 (L) 01/20/2021   GLUCOSE 249 (H) 01/20/2021   BUN 13 01/20/2021   CREATININE 1.07 (H) 01/20/2021   CALCIUM 7.1 (L) 01/20/2021   GFRNONAA 52 (L) 01/20/2021   GFRAA 64 09/08/2020   Lipid Panel: No results found for: LDLCALC HgbA1c: No results found for: HGBA1C Urine Drug Screen: No results found for: LABOPIA, COCAINSCRNUR, LABBENZ, AMPHETMU, THCU, LABBARB  Alcohol Level No results found for: [place]  CT Head without contrast: Personally reviewed and CTH was negative for a large hypodensity concerning for a large territory infarct or hyperdensity concerning for an ICH  Impression   Teresa Hughes is a 80 y.o. female admitted with elective mitral valve clipping complicated by cardiac tamponade and hypotension with sudden onset AMS and some concern for LUE and LLE weakness. Too unstable to complete stroke eval, once stabilized from cardiac standpoint and neuromuscular blockade reversed, she is noted to be awake, following commands in all extremities with no focal weakness or numbness, still intubated.  I suspect the noted L sided weakness was probably recrudescence of prior stroke symptoms in the setting of hypotension and cardiac tamponade, she appears to have had a prior R MCA stroke back in 2018 with signs of prior R insular infarct noted on the current CT Head. Unlikely to be a seizure, hypotension 2/2 tamponade is a better alternate explanation for AMS and weakness.  She does have afibb and her Eliquis is on hold 2/2 tamponade.   Recommendations  - Can resume Anticoagulation  when appropriate per cards. - Recommend repeat neuro exam x1 when extubated to assess for any expressive aphasia or vision abnormalities. ______________________________________________________________________   Thank you for the opportunity to take part in the care of this patient. If you have any further questions, please contact the neurology consultation attending.  Signed,  Teresa Hughes Pager Number [phone] _ _ _   _ __   _ __ _ _  __ __   _ __   __ _

## 2021-01-20 NOTE — Interval H&P Note (Signed)
History and Physical Interval Note:  01/20/2021 9:53 AM  Teresa Hughes  has presented today for surgery, with the diagnosis of Severe Mitral Insufficiency.  The various methods of treatment have been discussed with the patient and family. After consideration of risks, benefits and other options for treatment, the patient has consented to  Procedure(s): MITRAL VALVE REPAIR (N/A) TRANSESOPHAGEAL ECHOCARDIOGRAM (TEE) (N/A) as a surgical intervention.  The patient's history has been reviewed, patient examined, no change in status, stable for surgery.  I have reviewed the patient's chart and labs.  Questions were answered to the patient's satisfaction.     [name]

## 2021-01-21 ENCOUNTER — Inpatient Hospital Stay (HOSPITAL_COMMUNITY): Payer: Medicare HMO

## 2021-01-21 ENCOUNTER — Encounter (HOSPITAL_COMMUNITY): Payer: Self-pay | Admitting: Interventional Cardiology

## 2021-01-21 DIAGNOSIS — Z954 Presence of other heart-valve replacement: Secondary | ICD-10-CM

## 2021-01-21 DIAGNOSIS — Z978 Presence of other specified devices: Secondary | ICD-10-CM

## 2021-01-21 DIAGNOSIS — I4821 Permanent atrial fibrillation: Secondary | ICD-10-CM

## 2021-01-21 DIAGNOSIS — Z006 Encounter for examination for normal comparison and control in clinical research program: Secondary | ICD-10-CM | POA: Diagnosis not present

## 2021-01-21 DIAGNOSIS — R531 Weakness: Secondary | ICD-10-CM

## 2021-01-21 DIAGNOSIS — J9601 Acute respiratory failure with hypoxia: Secondary | ICD-10-CM | POA: Diagnosis not present

## 2021-01-21 DIAGNOSIS — I080 Rheumatic disorders of both mitral and aortic valves: Secondary | ICD-10-CM | POA: Diagnosis not present

## 2021-01-21 DIAGNOSIS — Z20822 Contact with and (suspected) exposure to covid-19: Secondary | ICD-10-CM | POA: Diagnosis not present

## 2021-01-21 DIAGNOSIS — I313 Pericardial effusion (noninflammatory): Secondary | ICD-10-CM

## 2021-01-21 DIAGNOSIS — Z8673 Personal history of transient ischemic attack (TIA), and cerebral infarction without residual deficits: Secondary | ICD-10-CM

## 2021-01-21 LAB — ECHOCARDIOGRAM COMPLETE
AR max vel: 2.35 cm2
AV Area VTI: 2.49 cm2
AV Area mean vel: 2.07 cm2
AV Mean grad: 3 mmHg
AV Peak grad: 5.7 mmHg
Ao pk vel: 1.19 m/s
Area-P 1/2: 1.98 cm2
Height: 66 in
MV VTI: 1.22 cm2
P 1/2 time: 522 msec
S' Lateral: 2.6 cm
Weight: 2007.07 oz

## 2021-01-21 LAB — COMPREHENSIVE METABOLIC PANEL
ALT: 18 U/L (ref 0–44)
AST: 32 U/L (ref 15–41)
Albumin: 2.8 g/dL — ABNORMAL LOW (ref 3.5–5.0)
Alkaline Phosphatase: 38 U/L (ref 38–126)
Anion gap: 8 (ref 5–15)
BUN: 22 mg/dL (ref 8–23)
CO2: 24 mmol/L (ref 22–32)
Calcium: 7.6 mg/dL — ABNORMAL LOW (ref 8.9–10.3)
Chloride: 111 mmol/L (ref 98–111)
Creatinine, Ser: 1.12 mg/dL — ABNORMAL HIGH (ref 0.44–1.00)
GFR, Estimated: 49 mL/min — ABNORMAL LOW (ref >60)
Glucose, Bld: 139 mg/dL — ABNORMAL HIGH (ref 70–99)
Potassium: 3.9 mmol/L (ref 3.5–5.1)
Sodium: 143 mmol/L (ref 135–145)
Total Bilirubin: 1 mg/dL (ref 0.3–1.2)
Total Protein: 4.7 g/dL — ABNORMAL LOW (ref 6.5–8.1)

## 2021-01-21 LAB — CBC
HCT: 30.2 % — ABNORMAL LOW (ref 36.0–46.0)
Hemoglobin: 10 g/dL — ABNORMAL LOW (ref 12.0–15.0)
MCH: 31.4 pg (ref 26.0–34.0)
MCHC: 33.1 g/dL (ref 30.0–36.0)
MCV: 95 fL (ref 80.0–100.0)
Platelets: 153 10*3/uL (ref 150–400)
RBC: 3.18 MIL/uL — ABNORMAL LOW (ref 3.87–5.11)
RDW: 14.1 % (ref 11.5–15.5)
WBC: 9.6 10*3/uL (ref 4.0–10.5)
nRBC: 0 % (ref 0.0–0.2)

## 2021-01-21 LAB — PROTIME-INR
INR: 1.2 (ref 0.8–1.2)
Prothrombin Time: 15.1 seconds (ref 11.4–15.2)

## 2021-01-21 LAB — BASIC METABOLIC PANEL
Anion gap: 9 (ref 5–15)
BUN: 16 mg/dL (ref 8–23)
CO2: 25 mmol/L (ref 22–32)
Calcium: 8.2 mg/dL — ABNORMAL LOW (ref 8.9–10.3)
Chloride: 109 mmol/L (ref 98–111)
Creatinine, Ser: 1.03 mg/dL — ABNORMAL HIGH (ref 0.44–1.00)
GFR, Estimated: 54 mL/min — ABNORMAL LOW (ref >60)
Glucose, Bld: 167 mg/dL — ABNORMAL HIGH (ref 70–99)
Potassium: 4.2 mmol/L (ref 3.5–5.1)
Sodium: 143 mmol/L (ref 135–145)

## 2021-01-21 LAB — BRAIN NATRIURETIC PEPTIDE: B Natriuretic Peptide: 259.3 pg/mL — ABNORMAL HIGH (ref 0.0–100.0)

## 2021-01-21 MED ORDER — SODIUM CHLORIDE 0.9% FLUSH: 10.0000 mL | INTRAVENOUS | Status: DC | PRN

## 2021-01-21 MED ORDER — ROSUVASTATIN CALCIUM 20 MG PO TABS
20.0000 mg | ORAL_TABLET | Freq: Every day | ORAL | Status: DC
  Administered 2021-01-21 – 2021-01-23 (×3): 20 mg via ORAL
  Filled 2021-01-21 (×3): qty 1

## 2021-01-21 MED ORDER — CHLORHEXIDINE GLUCONATE CLOTH 2 % EX PADS
6.0000 | MEDICATED_PAD | Freq: Every day | CUTANEOUS | Status: DC
  Administered 2021-01-21: 6 via TOPICAL

## 2021-01-21 MED ORDER — CLOPIDOGREL BISULFATE 75 MG PO TABS
75.0000 mg | ORAL_TABLET | Freq: Every day | ORAL | Status: DC
  Administered 2021-01-22 – 2021-01-24 (×3): 75 mg via ORAL
  Filled 2021-01-21 (×3): qty 1

## 2021-01-21 MED ORDER — SODIUM CHLORIDE 0.9% FLUSH
10.0000 mL | Freq: Two times a day (BID) | INTRAVENOUS | Status: DC
Start: 2021-01-21 — End: 2021-01-23
  Administered 2021-01-21 – 2021-01-22 (×3): 10 mL

## 2021-01-21 MED ORDER — METOPROLOL TARTRATE 50 MG PO TABS
50.0000 mg | ORAL_TABLET | Freq: Two times a day (BID) | ORAL | Status: DC
  Administered 2021-01-22 (×2): 50 mg via ORAL
  Filled 2021-01-21 (×3): qty 1

## 2021-01-21 MED ORDER — ALLOPURINOL 300 MG PO TABS
300.0000 mg | ORAL_TABLET | Freq: Every day | ORAL | Status: DC
  Administered 2021-01-21 – 2021-01-23 (×3): 300 mg via ORAL
  Filled 2021-01-21 (×3): qty 1

## 2021-01-21 MED ORDER — POTASSIUM CHLORIDE CRYS ER 20 MEQ PO TBCR
20.0000 meq | EXTENDED_RELEASE_TABLET | Freq: Two times a day (BID) | ORAL | Status: DC
  Administered 2021-01-21 – 2021-01-24 (×6): 20 meq via ORAL
  Filled 2021-01-21 (×6): qty 1

## 2021-01-21 MED ORDER — DIGOXIN 125 MCG PO TABS
0.0625 mg | ORAL_TABLET | Freq: Every day | ORAL | Status: DC
  Administered 2021-01-22 – 2021-01-24 (×3): 0.0625 mg via ORAL
  Filled 2021-01-21 (×3): qty 1

## 2021-01-21 MED ORDER — MIRTAZAPINE 15 MG PO TABS
7.5000 mg | ORAL_TABLET | Freq: Every day | ORAL | Status: DC
  Administered 2021-01-21 – 2021-01-23 (×3): 7.5 mg via ORAL
  Filled 2021-01-21 (×3): qty 1

## 2021-01-21 MED ORDER — PANTOPRAZOLE SODIUM 40 MG PO TBEC
40.0000 mg | DELAYED_RELEASE_TABLET | Freq: Every day | ORAL | Status: DC
  Administered 2021-01-22 – 2021-01-24 (×3): 40 mg via ORAL
  Filled 2021-01-21 (×3): qty 1

## 2021-01-21 MED ORDER — POLYETHYLENE GLYCOL 3350 17 G PO PACK
17.0000 g | PACK | Freq: Every day | ORAL | Status: DC
  Administered 2021-01-22 – 2021-01-24 (×3): 17 g via ORAL
  Filled 2021-01-21 (×3): qty 1

## 2021-01-21 MED ORDER — DOCUSATE SODIUM 100 MG PO CAPS
100.0000 mg | ORAL_CAPSULE | Freq: Every day | ORAL | Status: DC
  Administered 2021-01-22 – 2021-01-24 (×3): 100 mg via ORAL
  Filled 2021-01-21 (×3): qty 1

## 2021-01-21 MED ORDER — FUROSEMIDE 40 MG PO TABS
40.0000 mg | ORAL_TABLET | Freq: Every day | ORAL | Status: DC
  Administered 2021-01-22 – 2021-01-24 (×3): 40 mg via ORAL
  Filled 2021-01-21 (×3): qty 1

## 2021-01-21 MED FILL — Epinephrine Soln Prefilled Syringe 1 MG/10ML (0.1 MG/ML): INTRAMUSCULAR | Qty: 10 | Status: AC

## 2021-01-21 MED FILL — Etomidate IV Soln 2 MG/ML: INTRAVENOUS | Qty: 10 | Status: AC

## 2021-01-21 NOTE — Progress Notes (Signed)
Progress Note  Patient Name: Teresa Hughes Date of Encounter: 01/21/2021  [facility] Cardiologist: Shirlee More, MD   Subjective   Intubated but awake. Pain in throat. Otherwise no complaints. No chest pain. Daughter at bedside.   Inpatient Medications    Scheduled Meds:  allopurinol  300 mg Per Tube QHS   chlorhexidine gluconate (MEDLINE KIT)  15 mL Mouth Rinse BID   Chlorhexidine Gluconate Cloth  6 each Topical Daily   clopidogrel  75 mg Per Tube Daily   digoxin  0.0625 mg Per Tube Daily   docusate  100 mg Per Tube BID   furosemide  40 mg Per Tube Daily   mouth rinse  15 mL Mouth Rinse 10 times per day   metoprolol tartrate  50 mg Per Tube BID   mirtazapine  7.5 mg Per Tube QHS   pantoprazole (PROTONIX) IV  40 mg Intravenous Daily   polyethylene glycol  17 g Per Tube Daily   potassium chloride  20 mEq Per Tube BID   rosuvastatin  20 mg Per Tube q1800   sodium chloride flush  10-40 mL Intracatheter Q12H   sodium chloride flush  3 mL Intravenous Q12H   Continuous Infusions:  sodium chloride     dexmedetomidine (PRECEDEX) IV infusion 0.1 mcg/kg/hr (01/21/21 0700)   norepinephrine (LEVOPHED) Adult infusion     sodium chloride     PRN Meds: sodium chloride, acetaminophen, fentaNYL (SUBLIMAZE) injection, fentaNYL (SUBLIMAZE) injection, ondansetron (ZOFRAN) IV, sodium chloride flush, sodium chloride flush   Vital Signs    Vitals:   01/21/21 0600 01/21/21 0700 01/21/21 0811 01/21/21 0844  BP: (!) 90/55 119/69    Pulse: 93 94    Resp: 18 18    Temp:   98.7 F (37.1 C)   TempSrc:   Axillary   SpO2: 97% 97%  98%  Weight: 56.9 kg     Height:        Intake/Output Summary (Last 24 hours) at 01/21/2021 0851 Last data filed at 01/21/2021 0700 Gross per 24 hour  Intake 878.35 ml  Output 1205 ml  Net -326.65 ml   Last 3 Weights 01/21/2021 01/20/2021 01/18/2021  Weight (lbs) 125 lb 7.1 oz 122 lb 2.2 oz 122 lb 3.2 oz  Weight (kg) 56.9 kg 55.4 kg 55.43 kg       Telemetry    Atrial fibrillation with controlled ventricular rate, frequent PVC's (isolated) - Personally Reviewed  ECG    Atrial fibrillation 83 bpm with PVC's, ST-T changes consider lateral ischemia - Personally Reviewed  Physical Exam  Elderly, awake and alert, ET tube in place on ventilator GEN: No acute distress.   Neck: No JVD Cardiac: irregular no murmur Respiratory: Clear to auscultation bilaterally. GI: Soft, nontender, non-distended  MS: 1+ right pretibial and trace left pretibial edema; No deformity. Right groin site clear.  Neuro:  Nonfocal  Psych: Normal affect   Labs    High Sensitivity Troponin:   Recent Labs  Lab 01/20/21 1504 01/20/21 1657  TROPONINIHS 278* 267*      Chemistry Recent Labs  Lab 01/18/21 1053 01/20/21 1504 01/20/21 1801 01/21/21 0123  NA 140 140 145 143  K 4.1 3.1* 2.9* 4.2  CL 106 110  --  109  CO2 23 19*  --  25  GLUCOSE 125* 249*  --  167*  BUN 17 13  --  16  CREATININE 1.02* 1.07*  --  1.03*  CALCIUM 9.2 7.1*  --  8.2*  PROT 6.4*  --   --   --   ALBUMIN 3.8  --   --   --   AST 24  --   --   --   ALT 17  --   --   --   ALKPHOS 58  --   --   --   BILITOT 1.0  --   --   --   GFRNONAA 55* 52*  --  54*  ANIONGAP 11 11  --  9     Hematology Recent Labs  Lab 01/18/21 1053 01/20/21 1504 01/20/21 1801 01/21/21 0123  WBC 8.5 13.0*  --  9.6  RBC 4.12 3.38*  --  3.18*  HGB 13.2 10.7* 12.2 10.0*  HCT 39.0 32.7* 36.0 30.2*  MCV 94.7 96.7  --  95.0  MCH 32.0 31.7  --  31.4  MCHC 33.8 32.7  --  33.1  RDW 14.2 14.1  --  14.1  PLT 170 162  --  153    BNP Recent Labs  Lab 01/20/21 1504  BNP 168.8*     DDimer No results for input(s): DDIMER in the last 168 hours.   Radiology    CT HEAD WO CONTRAST  Result Date: 01/20/2021 CLINICAL DATA:  Hypotension and unresponsiveness. EXAM: CT HEAD WITHOUT CONTRAST TECHNIQUE: Contiguous axial images were obtained from the base of the skull through the vertex without intravenous  contrast. COMPARISON:  08/27/2016 FINDINGS: Brain: Age related atrophy. No focal abnormality seen affecting the brainstem or cerebellum. Chronic small-vessel ischemic changes are present throughout the cerebral hemispheric white matter. 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 No sign of acute infarction, mass, hemorrhage, hydrocephalus or extra-axial collection. Vascular: There is atherosclerotic calcification of the major vessels at the base of the brain. Skull: Negative Sinuses/Orbits: Clear/normal Other: None IMPRESSION: No acute finding by CT. Atrophy and extensive chronic small-vessel ischemic changes. 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 Electronically Signed   By: Nelson Chimes M.D.   On: 01/20/2021 17:20   CARDIAC CATHETERIZATION  Result Date: 01/20/2021  Successful pericardiocentesis from the subxiphoid approach with removal of 450 cc bloody fluid.  Drain will be left in place.  Further management based on the amount of drainage.   CARDIAC CATHETERIZATION  Result Date: 01/20/2021 Successful transcatheter edge-to-edge mitral valve repair using a MitraClip G4 XT W device, positioned A2 P2, reducing mitral regurgitation from severe (4+) at baseline to mild (1+) post procedure.  Portable Chest x-ray  Result Date: 01/20/2021 CLINICAL DATA:  Endotracheal intubation EXAM: PORTABLE CHEST 1 VIEW COMPARISON:  01/18/2021 FINDINGS: Nasogastric tube enters the stomach. Endotracheal tube observed terminating just below the thoracic inlet, about 6.6 cm above the carina. The patient is rotated to the left on today's radiograph, reducing diagnostic sensitivity and specificity. Atherosclerotic calcification of the aortic arch. Indistinct opacity at the left lung base may be due to obliquity but could reflect left lower lobe airspace opacity. Narrow tubing projects over the left lower chest mitral clip noted. No visible pneumothorax. IMPRESSION: 1. Endotracheal tube is about at the thoracic inlet, 6.6 cm above the  carina. 2. Retrocardiac airspace opacity is nonspecific but could be from atelectasis. 3. Narrow tubing projects over the cardiac shadow, possibly a pericardial drain, but overall nonspecific. Electronically Signed   By: Van Clines M.D.   On: 01/20/2021 16:48   ECHO TEE  Result Date: 01/20/2021    TRANSESOPHOGEAL ECHO REPORT   Patient Name:   ELIZEBATH WEVER Date of Exam: 01/20/2021 Medical Rec #:  [id number]        Height:       66.0 in Accession #:    [id number]       Weight:       122.1 lb Date of Birth:  04/12/1941        BSA:          1.622 m Patient Age:    80 years         BP:           190/61 mmHg Patient Gender: F                HR:           85 bpm. Exam Location:  Inpatient Procedure: Transesophageal Echo and 3D Echo Indications:     I34.0 Nonrheumatic mitral (valve) insufficiency  History:         Patient has prior history of Echocardiogram examinations, most                  recent 09/14/2020. CHF, Arrythmias:Atrial Fibrillation and SVT;                  Risk Factors:Hypertension.                   Mitral Valve: Mitra-Clip valve is present in the mitral                  position. Procedure Date: 01/20/2021.  Sonographer:     Raquel Sarna Senior RDCS Referring Phys:  [place] Diagnosing Phys: Eleonore Chiquito MD  Sonographer Comments: 1 MitraClip placed A2P2 PROCEDURE: After discussion of the risks and benefits of a TEE, an informed consent was obtained from the patient. The transesophogeal probe was passed without difficulty through the esophogus of the patient. Sedation performed by different physician. Image quality was excellent. The patient's vital signs; including heart rate, blood pressure, and oxygen saturation; remained stable throughout the procedure. The patient developed no complications during the procedure. IMPRESSIONS  1. Echo guided mitraclip procedure. Severe mitral regurgitation was present due to a flail P2 segment. 2D PISA radius 1.0, ERO 0.53 cm2, R vol 84 cc. IAS puncture was  directly visualized on echo. An XTW mitraclip was placed in the A2-P2 position. The posteromedial orifice had a MG of 2 mmHG @ 109 bpm. The anterolateral orifice had a MG of 2 mmHG @ 70 bpm. There was mild central mitral regurgitation after clip placement. Systolic flow in all pulmonary veins became systolic dominant after clip placement. MVA of the anterolateral orifice was 1.92 cm2 and the posteromedial orificed was 2.31 cm2 (Total 4.2 cm2). No significant stenosis was present. There was a small iatrogenic PFO after the procedure with L to R shunting. There was a small pericardial effusion anterior to the RV before and after the procedure without any increase in size. There no apparent complications during the procedure.  2. Left ventricular ejection fraction, by estimation, is 55 to 60%. The left ventricle has normal function. The left ventricle has no regional wall motion abnormalities.  3. Right ventricular systolic function is normal. The right ventricular size is normal.  4. Left atrial size was moderately dilated. No left atrial/left atrial appendage thrombus was detected. The LAA emptying velocity was 56 cm/s.  5. A small pericardial effusion is present. The pericardial effusion is circumferential.  6. The mitral valve is myxomatous. Severe mitral valve regurgitation. No evidence of mitral stenosis. There is a Mitra-Clip present in the mitral position.  Procedure Date: 01/20/2021.  7. The aortic valve is tricuspid. Aortic valve regurgitation is mild. Mild aortic valve sclerosis is present, with no evidence of aortic valve stenosis.  8. There is Moderate (Grade III) layered plaque involving the transverse aorta and descending aorta. FINDINGS  Left Ventricle: Left ventricular ejection fraction, by estimation, is 55 to 60%. The left ventricle has normal function. The left ventricle has no regional wall motion abnormalities. The left ventricular internal cavity size was normal in size. Right Ventricle: The right  ventricular size is normal. No increase in right ventricular wall thickness. Right ventricular systolic function is normal. Left Atrium: Left atrial size was moderately dilated. No left atrial/left atrial appendage thrombus was detected. The LAA emptying velocity was 56 cm/s. Right Atrium: Right atrial size was normal in size. Pericardium: A small pericardial effusion is present. The pericardial effusion is circumferential. Mitral Valve: The mitral valve is myxomatous. Severe mitral valve regurgitation. There is a Mitra-Clip present in the mitral position. Procedure Date: 01/20/2021. No evidence of mitral valve stenosis. MV peak gradient, 7.2 mmHg. The mean mitral valve gradient is 1.5 mmHg. Tricuspid Valve: The tricuspid valve is normal in structure. Tricuspid valve regurgitation is mild . No evidence of tricuspid stenosis. Aortic Valve: The aortic valve is tricuspid. Aortic valve regurgitation is mild. Mild aortic valve sclerosis is present, with no evidence of aortic valve stenosis. Pulmonic Valve: The pulmonic valve was normal in structure. Pulmonic valve regurgitation is trivial. No evidence of pulmonic stenosis. Aorta: The aortic root and ascending aorta are structurally normal, with no evidence of dilitation. There is moderate (Grade III) layered plaque involving the transverse aorta and descending aorta. IAS/Shunts: The atrial septum is grossly normal. EKG: Rhythm strip during this exam demostrated atrial fibrillation.  LEFT VENTRICLE PLAX 2D LVOT diam:     2.20 cm LV SV:         42 LV SV Index:   26 LVOT Area:     3.80 cm  AORTIC VALVE LVOT Vmax:   73.20 cm/s LVOT Vmean:  51.100 cm/s LVOT VTI:    0.110 m  AORTA Ao Root diam: 3.67 cm Ao Asc diam:  3.55 cm MITRAL VALVE                 TRICUSPID VALVE MV Area VTI:  1.18 cm       TR Peak grad:   15.8 mmHg MV Peak grad: 7.2 mmHg       TR Vmax:        199.00 cm/s MV Mean grad: 1.5 mmHg MV Vmax:      1.34 m/s       SHUNTS MV Vmean:     53.0 cm/s      Systemic VTI:   0.11 m MR Peak grad:    83.2 mmHg   Systemic Diam: 2.20 cm MR Mean grad:    53.0 mmHg MR Vmax:         456.00 cm/s MR Vmean:        347.0 cm/s MR PISA:         6.28 cm MR PISA Eff ROA: 53 mm MR PISA Radius:  1.00 cm Eleonore Chiquito MD Electronically signed by Eleonore Chiquito MD Signature Date/Time: 01/20/2021/6:24:23 PM    Final     Cardiac Studies   POD #1 echo pending  Patient Profile     80 y.o. female with CAD s/p recent PCI and severe degenerative MR presents 6/9 for MitraClip procedure, complicated by post-op hemopericardium with  tamponade  Assessment & Plan    Severe non-rheumatic MR: s/p edge-to-edge repair with MitraClip with good MR reduction. Postop day #1 echo pending. Hemopericardium with cardiac tamponade: traumatic, post-procedure. 400 cc drained via pericardiocentesis with resolution of cardiogenic shock. Minimal drain output noted overnight. Check 2D echo today. Remove drain pending echo results. CAD s/p recent PCI: continue clopidogrel Persistent atrial fibrillation: hold apixaban secondary to hemopericardium. Continue current Rx.  Respiratory failure: secondary to hemodynamic instability in setting acute cardiac tamponade. Awake and alert, oxygenating well. Anticipate extubation today per CCM team.   Overall with marked improvement today. Appreciate care of the CCM team. Continue current Rx, hold apixaban, await 2D echo, keep in CCU today.   For questions or updates, please contact [place] Please consult [email or web address] for contact info under   Signed, [name], MD  01/21/2021, 8:51 AM

## 2021-01-21 NOTE — Progress Notes (Signed)
Spoke with RN who is aware of the central line removal

## 2021-01-21 NOTE — Evaluation (Signed)
Physical Therapy Evaluation Patient Details Name: Teresa Hughes MRN: [id number] DOB: 1941/03/08 Today's Date: 01/21/2021   History of Present Illness  80 yo admitted 6/9 for mitraclip with hypotension post procedure in cath lab holding with intubation, urgent cath with pericardiocentesis and lack of movement concerning for CVA with improvement after neuromuscular blockade reversal. Extubated 6/10. PMHx: mitral insufficiency Afib, CVA, HF, CAD, CKD  Clinical Impression  Pt very pleasant and eager to move with pt able to get to [facility] to void as well as walk 200'x 2 with min assist. Pt with decreased balance and endurance who will benefit from acute therapy to maximize mobility and independence with anticipation of follow up with cardiac rehab at [place]. Pt encouraged to mobilize with staff acutely.   HR 88-102 Supine BP 73/55 (62) Post gait 118/88 (99) SpO2 92-97% on 2L    Follow Up Recommendations No PT follow up    Equipment Recommendations  None recommended by PT    Recommendations for Other Services       Precautions / Restrictions Precautions Precautions: Fall      Mobility  Bed Mobility Overal bed mobility: Needs Assistance Bed Mobility: Supine to Sit     Supine to sit: HOB elevated;Supervision     General bed mobility comments: HOb 30 degrees with supervision for lines management    Transfers Overall transfer level: Needs assistance   Transfers: Sit to/from Stand Sit to Stand: Min guard         General transfer comment: guarding for safety with pt able to stand from bed, BSC and chair  Ambulation/Gait Ambulation/Gait assistance: Min assist Gait Distance (Feet): 200 Feet Assistive device: None Gait Pattern/deviations: Step-through pattern;Decreased stride length   Gait velocity interpretation: 1.31 - 2.62 ft/sec, indicative of limited community ambulator General Gait Details: pt able to walk 200' holding onto therapist with seated rest then an additional  200' without UE support with guarding for safety. pt with HR 88-102 and SpO2 >90% on 2L  Stairs            Wheelchair Mobility    Modified Rankin (Stroke Patients Only)       Balance Overall balance assessment: Mild deficits observed, not formally tested                                           Pertinent Vitals/Pain Pain Assessment: No/denies pain    Home Living Family/patient expects to be discharged to:: Private residence Living Arrangements: Alone Available Help at Discharge: Family;Available PRN/intermittently Type of Home: House Home Access: Level entry     Home Layout: One level Home Equipment: Grab bars - tub/shower;Grab bars - toilet;Shower seat;Walker - 2 wheels;Wheelchair - manual      Prior Function Level of Independence: Independent               Hand Dominance        Extremity/Trunk Assessment   Upper Extremity Assessment Upper Extremity Assessment: Overall WFL for tasks assessed    Lower Extremity Assessment Lower Extremity Assessment: Overall WFL for tasks assessed    Cervical / Trunk Assessment Cervical / Trunk Assessment: Normal  Communication   Communication: No difficulties  Cognition Arousal/Alertness: Awake/alert Behavior During Therapy: WFL for tasks assessed/performed Overall Cognitive Status: Within Functional Limits for tasks assessed  General Comments      Exercises     Assessment/Plan    PT Assessment Patient needs continued PT services  PT Problem List Decreased mobility;Decreased activity tolerance;Decreased balance       PT Treatment Interventions Gait training;Stair training;Functional mobility training;Balance training;Patient/family education;Therapeutic exercise;Therapeutic activities    PT Goals (Current goals can be found in the Care Plan section)  Acute Rehab PT Goals Patient Stated Goal: return home PT Goal Formulation:  With patient Time For Goal Achievement: 02/04/21 Potential to Achieve Goals: Good    Frequency Min 3X/week   Barriers to discharge        Co-evaluation               AM-PAC PT "6 Clicks" Mobility  Outcome Measure Help needed turning from your back to your side while in a flat bed without using bedrails?: A Little Help needed moving from lying on your back to sitting on the side of a flat bed without using bedrails?: A Little Help needed moving to and from a bed to a chair (including a wheelchair)?: A Little Help needed standing up from a chair using your arms (e.g., wheelchair or bedside chair)?: A Little Help needed to walk in hospital room?: A Little Help needed climbing 3-5 steps with a railing? : A Little 6 Click Score: 18    End of Session Equipment Utilized During Treatment: Gait belt;Oxygen Activity Tolerance: Patient tolerated treatment well Patient left: in chair;with call bell/phone within reach;with chair alarm set Nurse Communication: Mobility status PT Visit Diagnosis: Other abnormalities of gait and mobility (R26.89)    Time: 4270-6237 PT Time Calculation (min) (ACUTE ONLY): 31 min   Charges:   PT Evaluation $PT Eval Moderate Complexity: 1 Mod PT Treatments $Gait Training: 8-22 mins        Tavarion Babington P, PT Acute Rehabilitation Services Pager: [phone] Office: [phone]   Tanya Crothers B Freddi Forster 01/21/2021, 1:51 PM

## 2021-01-21 NOTE — Progress Notes (Signed)
   01/21/21 0844  Airway 7.5 mm  Placement Date/Time: 01/20/21 1520   Placed By: ICU physician  ETT Types: Endobronchial;Oral  Size (mm): 7.5 mm  Secured at (cm): 21 cm  Secured at (cm) 21 cm  Measured From Lips  Secured Location Left  Secured By Actuary Repositioned Yes  Prone position No  Site Condition Dry  Adult Ventilator Settings  Vent Type Servo i  Humidity HME  Vent Mode PRVC  Vt Set 470 mL  Set Rate 18 bmp  FiO2 (%) 40 %  I Time 0.8 Sec(s)  PEEP 5 cmH20  Adult Ventilator Measurements  Peak Airway Pressure 18 L/min  Mean Airway Pressure 8 cmH20  Plateau Pressure 15 cmH20  Resp Rate Spontaneous 2 br/min  Resp Rate Total 20 br/min  Exhaled Vt 447 mL  Spont TV 404 mL  Measured Ve 8.5 mL  I:E Ratio Measured 1:3  Auto PEEP 0 cmH20  Total PEEP 5 cmH20  SpO2 98 %  Adult Ventilator Alarms  Alarms On Y  Ve High Alarm 21 L/min  Ve Low Alarm 3 L/min  Resp Rate High Alarm 38 br/min  Resp Rate Low Alarm 8  PEEP Low Alarm 3 cmH2O  Press High Alarm 45 cmH2O  T Apnea 20 sec(s)  VAP Prevention  HME changed No  Ventilator changed No  Transported while on vent No  HOB> 30 Degrees Y  Equipment wiped down Yes  Daily Weaning Assessment  Daily Assessment of Readiness to Wean Wean protocol criteria met (SBT performed)  Reason not met Apnea  Breath Sounds  Bilateral Breath Sounds Clear  Airway Suctioning/Secretions  Suction Type ETT  Suction Device  Catheter  Secretion Amount Small  Secretion Color Clear  Secretion Consistency Thin  Suction Tolerance Tolerated well  Suctioning Adverse Effects None

## 2021-01-21 NOTE — Procedures (Signed)
Extubation Procedure Note  Patient Details:   Name: Teresa Hughes DOB: 09/21/1940 MRN: [id number]   Airway Documentation:    Vent end date: 01/21/21 Vent end time: 1100   Evaluation  O2 sats: stable throughout Complications: No apparent complications Patient did tolerate procedure well. Bilateral Breath Sounds: Clear   Yes, Placed on 4L [place] SPO2 95%  Gonzella Lex 01/21/2021, 11:03 AM

## 2021-01-21 NOTE — Evaluation (Signed)
Clinical/Bedside Swallow Evaluation Patient Details  Name: PARISA PINELA MRN: [id number] Date of Birth: 1941/04/08  Today's Date: 01/21/2021 Time: SLP Start Time (ACUTE ONLY): 13 SLP Stop Time (ACUTE ONLY): 3267 SLP Time Calculation (min) (ACUTE ONLY): 16 min  Past Medical History:  Past Medical History:  Diagnosis Date   Acute kidney injury ([place])    Anemia of chronic disease    Aneurysm ([place]) 07/01/2018   Anxiety    Asthma    Cerebral thrombosis with cerebral infarction ([place]) 05/07/2012   CHF (congestive heart failure) (H[facility])    Chronic anticoagulation 08/28/2016   Chronic atrial fibrillation ([place]) 08/28/2016   Chronic insomnia    Chronic renal disease    Coronary artery disease    DDD (degenerative disc disease), cervical    Dyspnea    Dysrhythmia    Esophageal stricture    GERD (gastroesophageal reflux disease)    Gout    Heart murmur    Hiatal hernia    High risk medication use 08/28/2016   Hypercalcemia    Hypertension    Hypertensive heart disease without heart failure 08/28/2016   Late effects of CVA (cerebrovascular accident)    Macular degeneration, dry    Osteopenia    Osteoporosis    Other hyperlipidemia 08/28/2016   Paroxysmal SVT (supraventricular tachycardia) ([place])    Pneumonia    Sleep apnea    Vitamin D deficiency    Past Surgical History:  Past Surgical History:  Procedure Laterality Date   CARDIAC CATHETERIZATION     CATARACT EXTRACTION Bilateral    Right 04/02/17, Left 9/22018   CORONARY ATHERECTOMY N/A 12/22/2020   Procedure: CORONARY ATHERECTOMY;  Surgeon: [name], MD;  Location: [facility];  Service: Cardiovascular;  Laterality: N/A;   CORONARY STENT INTERVENTION N/A 12/22/2020   Procedure: CORONARY STENT INTERVENTION;  Surgeon: [name], MD;  Location: [place] [facility];  Service: Cardiovascular;  Laterality: N/A;   ERCP W/ SPHINCTEROTOMY AND BALLOON DILATION     EYE SURGERY     HEMORRHOID SURGERY  2016   MITRAL VALVE  REPAIR N/A 01/20/2021   Procedure: MITRAL VALVE REPAIR;  Surgeon: [name], MD;  Location: [place] [facility];  Service: Cardiovascular;  Laterality: N/A;   PERICARDIOCENTESIS N/A 01/20/2021   Procedure: PERICARDIOCENTESIS;  Surgeon: Jettie Booze, MD;  Location: [place] [facility];  Service: Cardiovascular;  Laterality: N/A;   RIGHT/LEFT HEART CATH AND CORONARY ANGIOGRAPHY N/A 10/29/2020   Procedure: RIGHT/LEFT HEART CATH AND CORONARY ANGIOGRAPHY;  Surgeon: [name], MD;  Location: [place] [facility];  Service: Cardiovascular;  Laterality: N/A;   TEE WITHOUT CARDIOVERSION N/A 09/14/2020   Procedure: TRANSESOPHAGEAL ECHOCARDIOGRAM (TEE);  Surgeon: Buford Dresser, MD;  Location: [facility] ENDOSCOPY;  Service: Cardiovascular;  Laterality: N/A;   TEE WITHOUT CARDIOVERSION N/A 01/20/2021   Procedure: TRANSESOPHAGEAL ECHOCARDIOGRAM (TEE);  Surgeon: [name], MD;  Location: [place] [facility];  Service: Cardiovascular;  Laterality: N/A;   HPI:  80 yo admitted 6/9 for mitraclip with hypotension post procedure in cath lab holding with intubation, urgent cath with pericardiocentesis and lack of movement concerning for CVA with improvement after neuromuscular blockade reversal. Extubated 6/10. PMHx: mitral insufficiency Afib, CVA, HF, CAD, CKD   Assessment / Plan / Recommendation Clinical Impression  Pt has minimal dysphonia after brief intubation, and she reports mild L facial droop to be her baseline since prior CVA. No evidence of dysphagia or aspiration was noted across PO intake, and pt denies any subjective difficulties.  Recommend regular solids and thin liquids. SLP to sign off acutely. SLP Visit Diagnosis: Dysphagia, unspecified (R13.10)    Aspiration Risk  Mild aspiration risk    Diet Recommendation Regular;Thin liquid   Liquid Administration via: Cup;Straw Medication Administration: Whole meds with liquid Supervision: Patient able to self feed;Intermittent supervision to  cue for compensatory strategies Compensations: Slow rate;Small sips/bites Postural Changes: Seated upright at 90 degrees    Other  Recommendations Oral Care Recommendations: Oral care BID   Follow up Recommendations None      Frequency and Duration            Prognosis        Swallow Study   General HPI: 80 yo admitted 6/9 for mitraclip with hypotension post procedure in cath lab holding with intubation, urgent cath with pericardiocentesis and lack of movement concerning for CVA with improvement after neuromuscular blockade reversal. Extubated 6/10. PMHx: mitral insufficiency Afib, CVA, HF, CAD, CKD Type of Study: Bedside Swallow Evaluation Previous Swallow Assessment: none in chart Diet Prior to this Study: Regular;Thin liquids Temperature Spikes Noted: No Respiratory Status: Nasal cannula History of Recent Intubation: Yes Length of Intubations (days): 1 days Date extubated: 01/21/21 Behavior/Cognition: Alert;Cooperative;Pleasant mood Oral Cavity Assessment: Erythema (mild, around uvula) Oral Care Completed by SLP: No Oral Cavity - Dentition: Dentures, top;Missing dentition (missing some of her lower dentition, upper dentures are in place) Vision: Functional for self-feeding Self-Feeding Abilities: Able to feed self Patient Positioning: Upright in chair Baseline Vocal Quality: Hoarse (mild) Volitional Swallow: Able to elicit    Oral/Motor/Sensory Function Overall Oral Motor/Sensory Function:  (mild L facial droop at baseline from prior CVA, pt says no acute changes)   Ice Chips Ice chips: Not tested   Thin Liquid Thin Liquid: Within functional limits Presentation: Cup;Self Fed;Straw    Nectar Thick Nectar Thick Liquid: Not tested   Honey Thick Honey Thick Liquid: Not tested   Puree Puree: Within functional limits Presentation: Self Fed;Spoon   Solid     Solid: Within functional limits Presentation: Self Fed       Osie Bond., M.A. [place] Pager [phone] Office [phone]  01/21/2021,4:14 PM

## 2021-01-21 NOTE — Consult Note (Addendum)
NAME:  Teresa Hughes, MRN:  [id number], DOB:  Sep 03, 1940, LOS: 1 ADMISSION DATE:  01/20/2021, CONSULTATION DATE:  01/20/21 REFERRING MD:  Burt Knack - cards, CHIEF COMPLAINT:  Shock, respiratory failure    History of Present Illness:   80 yo F PMH mitral insufficiency Afib (on eliquis last dose 6/7 and plavix continued with recent stent 04/5637) Diastolic HF, prior CVA who presented 6/9 for mitraclip. In cath lab holding pt became hypotensive, was started on 581ml NS. Complained of generalized pain as well as stroke pain, then noted to have acute L sided weakness, L facial droop, and altered mental status. A code stroke was called, but patient rapidly declined becoming more hypoxic and requiring BVM. At time of arrival, neuro at bedside-- pt currently too unstable for CT H. Critical care consulted urgently for intubation and shock.   Started on dopamine, NE. 2 amp bicarb pushed. On ultrasound, appears to have tamponade physiology. Following intubation and line placement will go emergently to cath lab   Pertinent  Medical History  Mitral insufficiency  Diastolic heart failure Afib  CVA DDD Esophageal stricture   Significant Hospital Events: Including procedures, antibiotic start and stop dates in addition to other pertinent events   6/9 mitraclip. Acute hypotension and L sided deficits, progressive hypoxia. CCM consulted for intubation and further care   Interim History / Subjective:  6/9 emergently  taken to cath lab for pericardiocentesis.  400 cc of blood was drained from the pericardium. The patient immediately stabilized and came off of inotropic agents at that time . 6/9  about 1800 she was noted to not be moving her extremities She had been given 70mg  rocuronium at 1445. There was concern again for stroke like symptoms. She was given  NMBD reversal agent, and which resulted in movement of all extremities.  She has been stable overnight. Off pressors Remains intubated on 40%, 5 PEEP, Rate  if 18 and TV 470. Needs SBT Net negative 326 cc, 1500 cc UO  Objective   Blood pressure 119/69, pulse 94, temperature 98.7 F (37.1 C), temperature source Axillary, resp. rate 18, height 5\' 6"  (1.676 m), weight 56.9 kg, SpO2 98 %.    Vent Mode: PRVC FiO2 (%):  [40 %-100 %] 40 % Set Rate:  [18 bmp-20 bmp] 18 bmp Vt Set:  [470 mL] 470 mL PEEP:  [5 cmH20] 5 cmH20 Plateau Pressure:  [12 cmH20-18 cmH20] 15 cmH20   Intake/Output Summary (Last 24 hours) at 01/21/2021 7564 Last data filed at 01/21/2021 0700 Gross per 24 hour  Intake 878.35 ml  Output 1205 ml  Net -326.65 ml   Filed Weights   01/20/21 0854 01/21/21 0600  Weight: 55.4 kg 56.9 kg    Examination: General: Critically ill appearing elderly F, intubated, awake and alert HENT: NCAT ETT secure anicteric sclera , No LAD, No JVD Lungs: Bilateral chest excursion, symmetrical chest expansion, mechanically ventilated  Cardiovascular:  SR with occasional  ventricular ectopy,  s1s2 no rgm  Abdomen: soft round ndnt  Extremities: no acute joint deformity. Brisk refill, No clubbing Neuro: lightly sedated, awake and alert, following commands GU: defered  Labs/imaging that I have personally reviewed  (right click and "Reselect all SmartList Selections" daily)  CBC - WBC 9.6 hgb 10. Creatinine 1.03, BUN 16, GFR 54  Resolved Hospital Problem list     Assessment & Plan:     Acute Hypoxic Respiratory Failure -Developed post mitral valve repair P: Will plan to extubate 6/9 if she tolerates SBT  Head of bed elevated 30 degrees. Follow intermittent chest x-ray and ABG.   Aggressive pulmonary hygiene Check Mag in 6/11 IS when extubated Q 1  Follow cultures   Obstructive shock Cardiac tamponade post mitral repair -Seen on emergent bedside ECHO in cath lab holding POD0 mitraclip P: Emergent;y to cath lab, intervention per cards Epi, dopa gtt all weaned to off Continuous telemetry Trend troponin Last dose of Eliquis 6/7 Last  dose of Plavix 9 Trend CBC, and coag factors Echo in progress>> eval once read  Severe mitral regurgitation - S/P mitraclip 6/9 Systolic congestive heart failure -EF 50-55% on ECHO 09/14/2020 P: Primary management per cardiology Continuous telemetry Strict intake and output Trend BNP Repeat ECHO pending   Acute encephalopathy -?r/t  possible CVA vs hypoxia, hypoperfusion  - Frequent re-orientation - Blinds open , lights on during the day, and off at night  Acute L sided weakness/ concern for code stroke -Seen post mitral repair with new onset left facial droop with altered mental status.  - Resolved 6/10 Hx of prior CVA with cerebral thrombus 04/2012 P: Management per neurology Maintain neuro protective measures; goal for eurothermia, euglycemia, eunatermia, normoxia, and PCO2 goal of 35-40 STAT head CT per code stroke>> No acute finding by CT. Atrophy and extensive chronic small-vessel ischemic changes. 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   Chronic A-fib -Rate controlled with digoxin and lopressor, anticoagulated with Eliquis and Plavix P: Continuous telemetry Hold anticoagulation for now Resume per cardiology   Evolving acute kidney injury Creatinine  P: Follow renal function  Monitor urine output Trend Bmet Avoid nephrotoxins Ensure adequate renal perfusion  IV hydration   Hx of HTN Hx of HLD -Home medications include Digoxin, Lasix, Lopressor, and Crestor P: Hold home medication due to hemodynamic instability     Best practice (right click and "Reselect all SmartList Selections" daily)  Diet:  NPO Pain/Anxiety/Delirium protocol (if indicated): Yes (RASS goal -1) VAP protocol (if indicated): Yes DVT prophylaxis: SCD GI prophylaxis: PPI Glucose control:  SSI No Central venous access:  Yes, and it is still needed Arterial line:  Yes, and it is still needed Foley:  N/A Mobility:  bed rest  PT consulted: N/A Last date of multidisciplinary goals of care  discussion [pending] Code Status:  full code Disposition: ICU   Labs   CBC: Recent Labs  Lab 01/18/21 1053 01/20/21 1504 01/20/21 1801 01/21/21 0123  WBC 8.5 13.0*  --  9.6  HGB 13.2 10.7* 12.2 10.0*  HCT 39.0 32.7* 36.0 30.2*  MCV 94.7 96.7  --  95.0  PLT 170 162  --  979    Basic Metabolic Panel: Recent Labs  Lab 01/18/21 1053 01/20/21 1504 01/20/21 1801 01/21/21 0123  NA 140 140 145 143  K 4.1 3.1* 2.9* 4.2  CL 106 110  --  109  CO2 23 19*  --  25  GLUCOSE 125* 249*  --  167*  BUN 17 13  --  16  CREATININE 1.02* 1.07*  --  1.03*  CALCIUM 9.2 7.1*  --  8.2*  MG  --  1.8  --   --    GFR: Estimated Creatinine Clearance: 37.8 mL/min (A) (by C-G formula based on SCr of 1.03 mg/dL (H)). Recent Labs  Lab 01/18/21 1053 01/20/21 1504 01/21/21 0123  WBC 8.5 13.0* 9.6    Liver Function Tests: Recent Labs  Lab 01/18/21 1053  AST 24  ALT 17  ALKPHOS 58  BILITOT 1.0  PROT 6.4*  ALBUMIN  3.8   No results for input(s): LIPASE, AMYLASE in the last 168 hours. No results for input(s): AMMONIA in the last 168 hours.  ABG    Component Value Date/Time   PHART 7.401 01/20/2021 1801   PCO2ART 38.1 01/20/2021 1801   PO2ART 265 (H) 01/20/2021 1801   HCO3 23.8 01/20/2021 1801   TCO2 25 01/20/2021 1801   ACIDBASEDEF 1.0 01/20/2021 1801   O2SAT 100.0 01/20/2021 1801     Coagulation Profile: Recent Labs  Lab 01/18/21 1053  INR 1.3*    Cardiac Enzymes: No results for input(s): CKTOTAL, CKMB, CKMBINDEX, TROPONINI in the last 168 hours.  HbA1C: No results found for: HGBA1C  CBG: Recent Labs  Lab 01/20/21 1435  GLUCAP 167*    Review of Systems:   Awake and alert, following commands  Past Medical History:  She,  has a past medical history of Acute kidney injury ([place]), Anemia of chronic disease, Aneurysm ([place]) (07/01/2018), Anxiety, Asthma, Cerebral thrombosis with cerebral infarction [facility]) (05/07/2012), CHF (congestive heart failure) ([place]), Chronic  anticoagulation (08/28/2016), Chronic atrial fibrillation ([place]) (08/28/2016), Chronic insomnia, Chronic renal disease, Coronary artery disease, DDD (degenerative disc disease), cervical, Dyspnea, Dysrhythmia, Esophageal stricture, GERD (gastroesophageal reflux disease), Gout, Heart murmur, Hiatal hernia, High risk medication use (08/28/2016), Hypercalcemia, Hypertension, Hypertensive heart disease without heart failure (08/28/2016), Late effects of CVA (cerebrovascular accident), Macular degeneration, dry, Osteopenia, Osteoporosis, Other hyperlipidemia (08/28/2016), Paroxysmal SVT (supraventricular tachycardia) ([place]), Pneumonia, Sleep apnea, and Vitamin D deficiency.   Surgical History:   Past Surgical History:  Procedure Laterality Date   CARDIAC CATHETERIZATION     CATARACT EXTRACTION Bilateral    Right 04/02/17, Left 9/22018   CORONARY ATHERECTOMY N/A 12/22/2020   Procedure: CORONARY ATHERECTOMY;  Surgeon: [name], MD;  Location: [place] [facility];  Service: Cardiovascular;  Laterality: N/A;   CORONARY STENT INTERVENTION N/A 12/22/2020   Procedure: CORONARY STENT INTERVENTION;  Surgeon: [name], MD;  Location: [place] [facility];  Service: Cardiovascular;  Laterality: N/A;   ERCP W/ SPHINCTEROTOMY AND BALLOON DILATION     EYE SURGERY     HEMORRHOID SURGERY  2016   MITRAL VALVE REPAIR N/A 01/20/2021   Procedure: MITRAL VALVE REPAIR;  Surgeon: [name], MD;  Location: [place] [facility];  Service: Cardiovascular;  Laterality: N/A;   PERICARDIOCENTESIS N/A 01/20/2021   Procedure: PERICARDIOCENTESIS;  Surgeon: Jettie Booze, MD;  Location: [place] [facility];  Service: Cardiovascular;  Laterality: N/A;   RIGHT/LEFT HEART CATH AND CORONARY ANGIOGRAPHY N/A 10/29/2020   Procedure: RIGHT/LEFT HEART CATH AND CORONARY ANGIOGRAPHY;  Surgeon: [name], MD;  Location: [place] [facility];  Service: Cardiovascular;  Laterality: N/A;   TEE WITHOUT CARDIOVERSION N/A 09/14/2020    Procedure: TRANSESOPHAGEAL ECHOCARDIOGRAM (TEE);  Surgeon: Buford Dresser, MD;  Location: [facility] ENDOSCOPY;  Service: Cardiovascular;  Laterality: N/A;   TEE WITHOUT CARDIOVERSION N/A 01/20/2021   Procedure: TRANSESOPHAGEAL ECHOCARDIOGRAM (TEE);  Surgeon: [name], MD;  Location: [place] [facility];  Service: Cardiovascular;  Laterality: N/A;     Social History:   reports that she has never smoked. Her smokeless tobacco use includes snuff. She reports that she does not drink alcohol and does not use drugs.   Family History:  Her family history includes Aneurysm in her maternal grandmother; Congestive Heart Failure in her father; Diabetes in her mother; Heart attack in her brother, father, and sister; Heart disease in her brother, father, maternal grandfather, and maternal grandmother; Hypertension in her brother and mother; Skin cancer in  her brother; Stroke in her maternal grandfather and mother.   Allergies Allergies  Allergen Reactions   Propranolol Other (See Comments)    Bradycardia   Sulfamethoxazole Swelling   Codeine Nausea And Vomiting   Latex Itching and Rash     Home Medications  Prior to Admission medications   Medication Sig Start Date End Date Taking? Authorizing Provider  alendronate (FOSAMAX) 70 MG tablet Take 70 mg by mouth every Sunday. Take with a full glass of water on an empty stomach.   Yes [provider]  allopurinol (ZYLOPRIM) 300 MG tablet Take 300 mg by mouth at bedtime.   Yes [provider]  apixaban (ELIQUIS) 2.5 MG TABS tablet Take 1 tablet (2.5 mg total) by mouth 2 (two) times daily. 10/30/20  Yes [name], MD  Biotin 5 MG CAPS Take 5 mg by mouth daily.   Yes [provider]  Cholecalciferol (VITAMIN D3) 25 MCG (1000 UT) CAPS Take 1,000 Units by mouth daily.   Yes [provider]  clopidogrel (PLAVIX) 75 MG tablet Take 1 tablet (75 mg total) by mouth daily. 12/16/20  Yes Eileen Stanford, PA-C  digoxin  (LANOXIN) 0.125 MG tablet Take 0.0625 mg by mouth daily.   Yes [provider]  furosemide (LASIX) 40 MG tablet Take 40 mg by mouth daily.   Yes [provider]  metoprolol tartrate (LOPRESSOR) 50 MG tablet Take 50 mg by mouth 2 (two) times daily.   Yes [provider]  mirtazapine (REMERON) 15 MG tablet Take 7.5 mg by mouth at bedtime. 08/27/20  Yes [provider]  Multiple Vitamin (MULTIVITAMIN WITH MINERALS) TABS tablet Take 1 tablet by mouth daily.   Yes [provider]  Multiple Vitamins-Minerals (PRESERVISION AREDS 2) CAPS Take 1 capsule by mouth 2 (two) times daily.   Yes [provider]  omega-3 acid ethyl esters (LOVAZA) 1 g capsule Take 2 g by mouth 2 (two) times daily. 06/04/20  Yes [provider]  pantoprazole (PROTONIX) 40 MG tablet Take 1 tablet (40 mg total) by mouth daily. 12/16/20  Yes Eileen Stanford, PA-C  Polyethyl Glycol-Propyl Glycol (SYSTANE OP) Place 1 drop into both eyes 2 (two) times daily.   Yes [provider]  potassium chloride SA (KLOR-CON) 20 MEQ tablet Take 20 mEq by mouth 2 (two) times daily.   Yes [provider]  rosuvastatin (CRESTOR) 20 MG tablet Take 1 tablet (20 mg total) by mouth daily. 12/23/20 06/21/21 Yes Cheryln Manly, NP     Critical care time: 35 minutes      CRITICAL CARE Performed by: Magdalen Spatz   Total critical care time: 35 minutes  Critical care time was exclusive of separately billable procedures and treating other patients.  Critical care was necessary to treat or prevent imminent or life-threatening deterioration.  Critical care was time spent personally by me on the following activities: development of treatment plan with patient and/or surrogate as well as nursing, discussions with consultants, evaluation of patient's response to treatment, examination of patient, obtaining history from patient or surrogate, ordering and performing treatments  and interventions, ordering and review of laboratory studies, ordering and review of radiographic studies, pulse oximetry and re-evaluation of patient's condition.  Magdalen Spatz, MSN, AGACNP-BC [place] for pager  01/21/2021, 9:26 AM   Pulmonary critical care attending:  This is an 80 year old female, past medical history of mitral insufficiency, atrial fibrillation on Eliquis, Plavix due to recent stent  in May 1155, chronic diastolic heart failure, history of CVA.  On 01/20/2021 she went for MitraClip placement.  Postoperatively she became hypotensive.  Patient was transferred to the intensive care unit.  Ultimately was intubated for respiratory failure and airway protection.  She had obstructive shock and bleeding into the pericardium underwent drainage by interventional cardiology.  BP 101/60   Pulse 61   Temp (!) 97.5 F (36.4 C) (Oral)   Resp (!) 26   Ht 5\' 6"  (1.676 m)   Wt 56.9 kg   SpO2 94%   BMI 20.25 kg/m   General: Elderly female intubated on mechanical life support HEENT endotracheal tube in place Heart: Regular rhythm S1-S2 Lungs: Bilateral ventilated breath sounds Abdomen: Soft nontender  Labs: Reviewed  Assessment: Acute hypoxemic respiratory failure status post mitral valve repair, mitral clipping, on mechanical ventilation Obstructive shock, cardiac tamponade Severe mitral regurg status post clip Chronic systolic heart failure Chronic atrial fibrillation AKI Hypertension Hyperlipidemia  Plan: SBT SAT Possible liberation from mechanical support today. Remains in ICU close observation post extubation Wean from support as tolerated Off sedation at this time Once extubated can restart oral meds. Will need PT and OT postextubation  This patient is critically ill with multiple organ system failure; which, requires frequent high complexity decision making, assessment, support, evaluation, and titration of therapies. This  was completed through the application of advanced monitoring technologies and extensive interpretation of multiple databases. During this encounter critical care time was devoted to patient care services described in this note for 36 minutes.   Addendum, late entry: Patient was also evaluated post extubation.  Doing well. Pulmonary critical care will sign off at this time  Please call for any questions or concerns.  Garner Nash, DO [facility] 01/21/2021 5:29 PM

## 2021-01-21 NOTE — Progress Notes (Signed)
Neurology Progress Note  Subjective: - Pt extubated and doing well - OOB to chair this afternoon, fully oriented with no focal neuro deficits  Exam: Vitals:   01/21/21 1600 01/21/21 1700  BP:    Pulse: 84 61  Resp: (!) 21 (!) 26  Temp:    SpO2: 98% 94%   Physical Exam Gen: A&O x4, NAD HEENT: Atraumatic, normocephalic;mucous membranes moist; oropharynx clear, tongue without atrophy or fasciculations. Neck: Supple, trachea midline. Resp: CTAB, no w/r/r CV: RRR, no m/g/r; nml S1 and S2. 2+ symmetric peripheral pulses. Abd: soft/NT/ND; nabs x 4 quad Extrem: Nml bulk; no cyanosis, clubbing, or edema.  Neuro: *MS: A&O x4. Follows multi-step commands.  *Speech: fluent, nondysarthric. No aphasia. Naming and repetition intact.  *CN:    I: Deferred   II,III: PERRLA, VFF by confrontation, optic discs sharp   III,IV,VI: EOMI w/o nystagmus, no ptosis   V: Sensation intact from V1 to V3 to LT   VII: Eyelid closure was full.  Smile symmetric.   VIII: Hearing intact to voice   IX,X: Voice normal, palate elevates symmetrically    XI: SCM/trap 5/5 bilat   XII: Tongue protrudes midline, no atrophy or fasciculations   *Motor:   Normal bulk.  No tremor, rigidity or bradykinesia. No pronator drift.    Strength: Dlt Bic Tri WrE WrF FgS Gr HF KnF KnE PlF DoF    Left 5 5 5 5 5 5 5 5 5 5 5 5     Right 5 5 5 5 5 5 5 5 5 5 5 5     *Sensory: Intact to light touch, pinprick, temperature vibration throughout. Symmetric. Propioception intact bilat.  No double-simultaneous extinction.  *Coordination:  Finger-to-nose, heel-to-shin, rapid alternating motions were intact. *Reflexes:  2+ and symmetric throughout without clonus; toes down-going bilat *Gait: deferred   Impression: Teresa Hughes is a 80 y.o. female admitted with elective mitral valve clipping complicated by cardiac tamponade and hypotension with sudden onset AMS and some concern for LUE and LLE weakness. After stabilization from cardiac  standpoint her neurologic exam is completely normal today. Favor recrudescence of prior stroke symptoms in the setting of hypotension and cardiac tamponade, she appears to have had a prior R MCA stroke back in 2018 with signs of prior R insular infarct noted on the current CT Head.   Pt has hx a fib, eliquis currently on hold per cardiology 2/2 tamponade. Will defer to cardiology on timing of restarting therapeutic anticoagulation for secondary stroke prevention.  Neurology to sign off, please re-engage if additional questions arise.  Su Monks, MD [facility] [phone]  If 7pm- 7am, please page neurology on call as listed in [place].

## 2021-01-22 LAB — CBC
HCT: 26.7 % — ABNORMAL LOW (ref 36.0–46.0)
Hemoglobin: 8.8 g/dL — ABNORMAL LOW (ref 12.0–15.0)
MCH: 32.1 pg (ref 26.0–34.0)
MCHC: 33 g/dL (ref 30.0–36.0)
MCV: 97.4 fL (ref 80.0–100.0)
Platelets: 124 10*3/uL — ABNORMAL LOW (ref 150–400)
RBC: 2.74 MIL/uL — ABNORMAL LOW (ref 3.87–5.11)
RDW: 14.5 % (ref 11.5–15.5)
WBC: 9.7 10*3/uL (ref 4.0–10.5)
nRBC: 0 % (ref 0.0–0.2)

## 2021-01-22 LAB — TROPONIN I (HIGH SENSITIVITY): Troponin I (High Sensitivity): 156 ng/L (ref <18)

## 2021-01-22 LAB — MAGNESIUM: Magnesium: 2.2 mg/dL (ref 1.7–2.4)

## 2021-01-22 MED ORDER — APIXABAN 2.5 MG PO TABS
2.5000 mg | ORAL_TABLET | Freq: Two times a day (BID) | ORAL | Status: DC
  Administered 2021-01-22 – 2021-01-24 (×4): 2.5 mg via ORAL
  Filled 2021-01-22 (×4): qty 1

## 2021-01-22 MED ORDER — APIXABAN 2.5 MG PO TABS: 2.5000 mg | ORAL_TABLET | Freq: Two times a day (BID) | ORAL | Status: DC

## 2021-01-22 MED ORDER — PHENOL 1.4 % MT LIQD
1.0000 | OROMUCOSAL | Status: DC | PRN
  Administered 2021-01-22: 1 via OROMUCOSAL
  Filled 2021-01-22: qty 177

## 2021-01-22 NOTE — Evaluation (Signed)
Occupational Therapy Evaluation Patient Details Name: Teresa Hughes MRN: [id number] DOB: 12-25-1940 Today's Date: 01/22/2021    History of Present Illness 80 yo admitted 6/9 for mitraclip with hypotension post procedure in cath lab holding with intubation, urgent cath with pericardiocentesis and lack of movement concerning for CVA with improvement after neuromuscular blockade reversal. Extubated 6/10. PMHx: mitral insufficiency Afib, CVA, HF, CAD, CKD   Clinical Impression   PTA patient independent with ADLs, IADLs and driving. Admitted for above and presenting with problem list below, including decreased activity tolerance, generalized weakness. She currently requires min guard for transfers, up to min assist for ADLs.  She is on 2L supplemental O2 via [place], VSS with HR ranging from 104-136. Patient will benefit from continued OT services while admitted to optimize independence, safety with ADLs, IADLs, but anticipate no further needs after dc home.     Follow Up Recommendations  No OT follow up;Supervision - Intermittent    Equipment Recommendations  None recommended by OT    Recommendations for Other Services       Precautions / Restrictions Precautions Precautions: Fall Restrictions Weight Bearing Restrictions: No      Mobility Bed Mobility               General bed mobility comments: OOB in recliner upon entry    Transfers Overall transfer level: Needs assistance Equipment used: None Transfers: Sit to/from Stand Sit to Stand: Min guard         General transfer comment: min guard for safety/balance    Balance Overall balance assessment: Mild deficits observed, not formally tested                                         ADL either performed or assessed with clinical judgement   ADL Overall ADL's : Needs assistance/impaired     Grooming: Min guard;Wash/dry face;Oral care;Standing   Upper Body Bathing: Set up;Sitting   Lower Body  Bathing: Set up;Sit to/from stand   Upper Body Dressing : Set up;Sitting   Lower Body Dressing: Minimal assistance;Sit to/from stand   Toilet Transfer: Min guard;Ambulation           Functional mobility during ADLs: Min guard       Vision         Perception     Praxis      Pertinent Vitals/Pain Pain Assessment: No/denies pain     Hand Dominance Right   Extremity/Trunk Assessment Upper Extremity Assessment Upper Extremity Assessment: Overall WFL for tasks assessed   Lower Extremity Assessment Lower Extremity Assessment: Defer to PT evaluation   Cervical / Trunk Assessment Cervical / Trunk Assessment: Normal   Communication Communication Communication: No difficulties   Cognition Arousal/Alertness: Awake/alert Behavior During Therapy: WFL for tasks assessed/performed Overall Cognitive Status: Within Functional Limits for tasks assessed                                     General Comments  pt on 2L O2 via [place], SPO2 maintained during ADLs and functional mobility; HR ranged from 104-136    Exercises     Shoulder Instructions      Home Living Family/patient expects to be discharged to:: Private residence Living Arrangements: Alone Available Help at Discharge: Family;Available PRN/intermittently Type of Home: House Home Access: Level entry  Home Layout: One level     Bathroom Shower/Tub: Occupational psychologist: Standard     Home Equipment: Grab bars - tub/shower;Grab bars - toilet;Shower seat;Walker - 2 wheels;Wheelchair - manual;Toilet riser          Prior Functioning/Environment Level of Independence: Independent        Comments: independent and driving        OT Problem List: Decreased strength;Decreased activity tolerance;Impaired balance (sitting and/or standing);Decreased knowledge of precautions;Cardiopulmonary status limiting activity      OT Treatment/Interventions: Self-care/ADL  training;Therapeutic exercise;DME and/or AE instruction;Patient/family education;Therapeutic activities    OT Goals(Current goals can be found in the care plan section) Acute Rehab OT Goals Patient Stated Goal: return home OT Goal Formulation: With patient Time For Goal Achievement: 02/05/21 Potential to Achieve Goals: Good  OT Frequency: Min 2X/week   Barriers to D/C:            Co-evaluation              AM-PAC OT "6 Clicks" Daily Activity     Outcome Measure Help from another person eating meals?: None Help from another person taking care of personal grooming?: A Little Help from another person toileting, which includes using toliet, bedpan, or urinal?: A Little Help from another person bathing (including washing, rinsing, drying)?: A Little Help from another person to put on and taking off regular upper body clothing?: A Little Help from another person to put on and taking off regular lower body clothing?: A Little 6 Click Score: 19   End of Session Equipment Utilized During Treatment: Oxygen Nurse Communication: Mobility status  Activity Tolerance: Patient tolerated treatment well Patient left: in chair;with call bell/phone within reach;with chair alarm set  OT Visit Diagnosis: Other abnormalities of gait and mobility (R26.89);Muscle weakness (generalized) (M62.81)                Time: 7893-8101 OT Time Calculation (min): 19 min Charges:  OT General Charges $OT Visit: 1 Visit OT Evaluation $OT Eval Moderate Complexity: 1 Mod  Jolaine Artist, OT Acute Rehabilitation Services Pager [phone] Office [phone]   Delight Stare 01/22/2021, 1:10 PM

## 2021-01-22 NOTE — Progress Notes (Signed)
CARDIAC REHAB PHASE I   PRE:  Rate/Rhythm: 62 Afib  BP:  Sitting: 109/68      SaO2: 94 2L  MODE:  Ambulation: 370 ft   POST:  Rate/Rhythm: 117 Afib  BP:  Sitting: 159/73    SaO2: 86 2L --> 97 2L   Pt ambulated 374ft in hallway standby assist with steady gait. Pt denies CP, SOB, or dizziness. Reviewed site care, restrictions, and encouraged ambulation with emphasis on safety. Pt declines CRP II at this time.   [id number] Rufina Falco, RN BSN 01/22/2021 10:05 AM

## 2021-01-22 NOTE — Progress Notes (Addendum)
Received pt from [place], pt alert and oriented, no complaints of any pain or discomfort.  Agree with previous RN's assessment. Belongings placed in cabinet, clothes, cp and pair of eyeglasses, pt assumes responsibility.

## 2021-01-22 NOTE — Progress Notes (Signed)
 Progress Note  Patient Name: Teresa Hughes Date of Encounter: 01/22/2021  [facility] Cardiologist: Brian Munley, MD   Subjective   Feeling better. No CP or dyspnea. Still with sore throat. Has walked and been up in chair yesterday. Beta-blocker has been held with borderline BP's.   Inpatient Medications    Scheduled Meds:  allopurinol  300 mg Oral QHS   chlorhexidine gluconate (MEDLINE KIT)  15 mL Mouth Rinse BID   Chlorhexidine Gluconate Cloth  6 each Topical Daily   clopidogrel  75 mg Oral Daily   digoxin  0.0625 mg Oral Daily   docusate sodium  100 mg Oral Daily   furosemide  40 mg Oral Daily   metoprolol tartrate  50 mg Oral BID   mirtazapine  7.5 mg Oral QHS   pantoprazole  40 mg Oral Daily   polyethylene glycol  17 g Oral Daily   potassium chloride  20 mEq Oral BID   rosuvastatin  20 mg Oral QHS   sodium chloride flush  10-40 mL Intracatheter Q12H   sodium chloride flush  3 mL Intravenous Q12H   Continuous Infusions:  sodium chloride     norepinephrine (LEVOPHED) Adult infusion     sodium chloride     PRN Meds: sodium chloride, acetaminophen, ondansetron (ZOFRAN) IV, sodium chloride flush, sodium chloride flush   Vital Signs    Vitals:   01/22/21 0400 01/22/21 0440 01/22/21 0505 01/22/21 0600  BP:   (!) 96/52 (!) 97/45  Pulse: (!) 59 65 (!) 40 (!) 54  Resp: 20 19 19 (!) 22  Temp: (!) 97.2 F (36.2 C)     TempSrc: Axillary     SpO2: 91% 94% 96% 96%  Weight:    56.2 kg  Height:        Intake/Output Summary (Last 24 hours) at 01/22/2021 0703 Last data filed at 01/22/2021 0330 Gross per 24 hour  Intake 363.95 ml  Output 1800 ml  Net -1436.05 ml   Last 3 Weights 01/22/2021 01/21/2021 01/20/2021  Weight (lbs) 123 lb 14.4 oz 125 lb 7.1 oz 122 lb 2.2 oz  Weight (kg) 56.2 kg 56.9 kg 55.4 kg      Telemetry    Atrial fibrillation, controlled ventricular rate, frequent PVC's - Personally Reviewed   Physical Exam  Elderly woman, NAD GEN: No acute  distress.   Neck: No JVD Cardiac: irregularly irregular, no murmurs, rubs, or gallops.  Respiratory: Clear to auscultation bilaterally. GI: Soft, nontender, non-distended  MS: trace edema left pretibial region; No deformity. Neuro:  Nonfocal  Psych: Normal affect   Labs    High Sensitivity Troponin:   Recent Labs  Lab 01/20/21 1504 01/20/21 1657  TROPONINIHS 278* 267*      Chemistry Recent Labs  Lab 01/18/21 1053 01/20/21 1504 01/20/21 1801 01/21/21 0123 01/21/21 1150  NA 140 140 145 143 143  K 4.1 3.1* 2.9* 4.2 3.9  CL 106 110  --  109 111  CO2 23 19*  --  25 24  GLUCOSE 125* 249*  --  167* 139*  BUN 17 13  --  16 22  CREATININE 1.02* 1.07*  --  1.03* 1.12*  CALCIUM 9.2 7.1*  --  8.2* 7.6*  PROT 6.4*  --   --   --  4.7*  ALBUMIN 3.8  --   --   --  2.8*  AST 24  --   --   --  32  ALT 17  --   --   --    18  ALKPHOS 58  --   --   --  38  BILITOT 1.0  --   --   --  1.0  GFRNONAA 55* 52*  --  54* 49*  ANIONGAP 11 11  --  9 8     Hematology Recent Labs  Lab 01/20/21 1504 01/20/21 1801 01/21/21 0123 01/22/21 0247  WBC 13.0*  --  9.6 9.7  RBC 3.38*  --  3.18* 2.74*  HGB 10.7* 12.2 10.0* 8.8*  HCT 32.7* 36.0 30.2* 26.7*  MCV 96.7  --  95.0 97.4  MCH 31.7  --  31.4 32.1  MCHC 32.7  --  33.1 33.0  RDW 14.1  --  14.1 14.5  PLT 162  --  153 124*    BNP Recent Labs  Lab 01/20/21 1504 01/21/21 0959  BNP 168.8* 259.3*     DDimer No results for input(s): DDIMER in the last 168 hours.   Radiology    CT HEAD WO CONTRAST  Result Date: 01/20/2021 CLINICAL DATA:  Hypotension and unresponsiveness. EXAM: CT HEAD WITHOUT CONTRAST TECHNIQUE: Contiguous axial images were obtained from the base of the skull through the vertex without intravenous contrast. COMPARISON:  08/27/2016 FINDINGS: Brain: Age related atrophy. No focal abnormality seen affecting the brainstem or cerebellum. Chronic small-vessel ischemic changes are present throughout the cerebral hemispheric  white matter. 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 No sign of acute infarction, mass, hemorrhage, hydrocephalus or extra-axial collection. Vascular: There is atherosclerotic calcification of the major vessels at the base of the brain. Skull: Negative Sinuses/Orbits: Clear/normal Other: None IMPRESSION: No acute finding by CT. Atrophy and extensive chronic small-vessel ischemic changes. 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 Electronically Signed   By: Mark  Shogry M.D.   On: 01/20/2021 17:20   CARDIAC CATHETERIZATION  Result Date: 01/20/2021  Successful pericardiocentesis from the subxiphoid approach with removal of 450 cc bloody fluid.  Drain will be left in place.  Further management based on the amount of drainage.   CARDIAC CATHETERIZATION  Result Date: 01/20/2021 Successful transcatheter edge-to-edge mitral valve repair using a MitraClip G4 XT W device, positioned A2 P2, reducing mitral regurgitation from severe (4+) at baseline to mild (1+) post procedure.  Portable Chest x-ray  Result Date: 01/20/2021 CLINICAL DATA:  Endotracheal intubation EXAM: PORTABLE CHEST 1 VIEW COMPARISON:  01/18/2021 FINDINGS: Nasogastric tube enters the stomach. Endotracheal tube observed terminating just below the thoracic inlet, about 6.6 cm above the carina. The patient is rotated to the left on today's radiograph, reducing diagnostic sensitivity and specificity. Atherosclerotic calcification of the aortic arch. Indistinct opacity at the left lung base may be due to obliquity but could reflect left lower lobe airspace opacity. Narrow tubing projects over the left lower chest mitral clip noted. No visible pneumothorax. IMPRESSION: 1. Endotracheal tube is about at the thoracic inlet, 6.6 cm above the carina. 2. Retrocardiac airspace opacity is nonspecific but could be from atelectasis. 3. Narrow tubing projects over the cardiac shadow, possibly a pericardial drain, but overall nonspecific. Electronically Signed   By: Walter   Liebkemann M.D.   On: 01/20/2021 16:48   ECHOCARDIOGRAM COMPLETE  Result Date: 01/21/2021    ECHOCARDIOGRAM REPORT   Patient Name:   Teresa Hughes Date of Exam: 01/21/2021 Medical Rec #:  [id number]        Height:       66.0 in Accession #:    [id number]       Weight:       125.4 lb Date   of Birth:  11/04/1940        BSA:          1.640 m Patient Age:    80 years         BP:           119/69 mmHg Patient Gender: F                HR:           94 bpm. Exam Location:  Inpatient Procedure: 2D Echo, Cardiac Doppler and Color Doppler Indications:    S/P Mitral Valve Clip implantation  History:        Patient has prior history of Echocardiogram examinations, most                 recent 07/13/2020. CHF; Risk Factors:Hypertension.  Sonographer:    John Mendel Brown Referring Phys: [id number] MICHAEL COOPER  Sonographer Comments: Echo performed with patient supine and on artificial respirator. Image acquisition challenging due to respiratory motion and bandages obscured windows. IMPRESSIONS  1. Left ventricular ejection fraction, by estimation, is 60 to 65%. The left ventricle has normal function. The left ventricle has no regional wall motion abnormalities. There is severe left ventricular hypertrophy of the basal-septal segment. Left ventricular diastolic function could not be evaluated. Elevated left ventricular end-diastolic pressure.  2. Right ventricular systolic function is normal. The right ventricular size is normal. Tricuspid regurgitation signal is inadequate for assessing PA pressure.  3. Left atrial size was severely dilated.  4. Right atrial size was mildly dilated.  5. The mitral valve has been repaired/replaced. There is a Mitra-Clip present in the mitral position. Trivial mitral valve regurgitation. No evidence of mitral stenosis. The mean mitral valve gradient is 3.0 mmHg.  6. The aortic valve was not well visualized. Aortic valve regurgitation is mild. Mild aortic valve sclerosis is present, with no evidence  of aortic valve stenosis. Aortic regurgitation PHT measures 522 msec. Aortic valve area, by VTI measures 2.49 cm. Aortic valve mean gradient measures 3.0 mmHg. Aortic valve Vmax measures 1.19 m/s.  7. Aortic dilatation noted. There is mild dilatation of the aortic root, measuring 40 mm.  8. Evidence of atrial level shunting detected by color flow Doppler. There is a small patent foramen ovale. FINDINGS  Left Ventricle: Left ventricular ejection fraction, by estimation, is 60 to 65%. The left ventricle has normal function. The left ventricle has no regional wall motion abnormalities. The left ventricular internal cavity size was normal in size. There is  severe left ventricular hypertrophy of the basal-septal segment. Left ventricular diastolic function could not be evaluated due to atrial fibrillation. Left ventricular diastolic function could not be evaluated. Elevated left ventricular end-diastolic pressure. Right Ventricle: The right ventricular size is normal. No increase in right ventricular wall thickness. Right ventricular systolic function is normal. Tricuspid regurgitation signal is inadequate for assessing PA pressure. Left Atrium: Left atrial size was severely dilated. Right Atrium: Right atrial size was mildly dilated. Pericardium: There is no evidence of pericardial effusion. Mitral Valve: The mitral valve has been repaired/replaced. Trivial mitral valve regurgitation. There is a Mitra-Clip present in the mitral position. No evidence of mitral valve stenosis. MV peak gradient, 11.4 mmHg. The mean mitral valve gradient is 3.0 mmHg. Tricuspid Valve: The tricuspid valve is normal in structure. Tricuspid valve regurgitation is trivial. No evidence of tricuspid stenosis. Aortic Valve: The aortic valve was not well visualized. Aortic valve regurgitation is mild. Aortic regurgitation PHT measures 522 msec. Mild   aortic valve sclerosis is present, with no evidence of aortic valve stenosis. Aortic valve mean  gradient measures  3.0 mmHg. Aortic valve peak gradient measures 5.7 mmHg. Aortic valve area, by VTI measures 2.49 cm. Pulmonic Valve: The pulmonic valve was normal in structure. Pulmonic valve regurgitation is mild. No evidence of pulmonic stenosis. Aorta: Aortic dilatation noted. There is mild dilatation of the aortic root, measuring 40 mm. Venous: The inferior vena cava was not well visualized. IAS/Shunts: Evidence of atrial level shunting detected by color flow Doppler. A small patent foramen ovale is detected.  LEFT VENTRICLE PLAX 2D LVIDd:         4.20 cm  Diastology LVIDs:         2.60 cm  LV e' medial:    3.12 cm/s LV PW:         1.20 cm  LV E/e' medial:  46.5 LV IVS:        1.50 cm  LV e' lateral:   5.87 cm/s LVOT diam:     1.90 cm  LV E/e' lateral: 24.7 LV SV:         55 LV SV Index:   33 LVOT Area:     2.84 cm  RIGHT VENTRICLE RV Basal diam:  3.70 cm RV S prime:     8.62 cm/s LEFT ATRIUM              Index       RIGHT ATRIUM           Index LA diam:        4.00 cm  2.44 cm/m  RA Area:     18.30 cm LA Vol (A2C):   141.0 ml 85.97 ml/m RA Volume:   49.80 ml  30.36 ml/m LA Vol (A4C):   123.0 ml 74.99 ml/m LA Biplane Vol: 133.0 ml 81.09 ml/m  AORTIC VALVE AV Area (Vmax):    2.35 cm AV Area (Vmean):   2.07 cm AV Area (VTI):     2.49 cm AV Vmax:           119.00 cm/s AV Vmean:          85.333 cm/s AV VTI:            0.220 m AV Peak Grad:      5.7 mmHg AV Mean Grad:      3.0 mmHg LVOT Vmax:         98.50 cm/s LVOT Vmean:        62.400 cm/s LVOT VTI:          0.193 m LVOT/AV VTI ratio: 0.88 AI PHT:            522 msec  AORTA Ao Root diam: 4.00 cm Ao Asc diam:  3.20 cm MITRAL VALVE MV Area (PHT): 1.98 cm     SHUNTS MV Area VTI:   1.22 cm     Systemic VTI:  0.19 m MV Peak grad:  11.4 mmHg    Systemic Diam: 1.90 cm MV Mean grad:  3.0 mmHg MV Vmax:       1.69 m/s MV Vmean:      83.8 cm/s MV Decel Time: 384 msec MV E velocity: 145.00 cm/s Fransico Him MD Electronically signed by Fransico Him MD Signature  Date/Time: 01/21/2021/11:48:30 AM    Final    ECHO TEE  Result Date: 01/20/2021    TRANSESOPHOGEAL ECHO REPORT   Patient Name:   Tinnie Gens Maya Date of Exam: 01/20/2021 Medical  Rec #:  [id number]        Height:       66.0 in Accession #:    [id number]       Weight:       122.1 lb Date of Birth:  1941/01/24        BSA:          1.622 m Patient Age:    65 years         BP:           190/61 mmHg Patient Gender: F                HR:           85 bpm. Exam Location:  Inpatient Procedure: Transesophageal Echo and 3D Echo Indications:     I34.0 Nonrheumatic mitral (valve) insufficiency  History:         Patient has prior history of Echocardiogram examinations, most                  recent 09/14/2020. CHF, Arrythmias:Atrial Fibrillation and SVT;                  Risk Factors:Hypertension.                   Mitral Valve: Mitra-Clip valve is present in the mitral                  position. Procedure Date: 01/20/2021.  Sonographer:     Raquel Sarna Senior RDCS Referring Phys:  [place] Diagnosing Phys: Eleonore Chiquito MD  Sonographer Comments: 1 MitraClip placed A2P2 PROCEDURE: After discussion of the risks and benefits of a TEE, an informed consent was obtained from the patient. The transesophogeal probe was passed without difficulty through the esophogus of the patient. Sedation performed by different physician. Image quality was excellent. The patient's vital signs; including heart rate, blood pressure, and oxygen saturation; remained stable throughout the procedure. The patient developed no complications during the procedure. IMPRESSIONS  1. Echo guided mitraclip procedure. Severe mitral regurgitation was present due to a flail P2 segment. 2D PISA radius 1.0, ERO 0.53 cm2, R vol 84 cc. IAS puncture was directly visualized on echo. An XTW mitraclip was placed in the A2-P2 position. The posteromedial orifice had a MG of 2 mmHG @ 109 bpm. The anterolateral orifice had a MG of 2 mmHG @ 70 bpm. There was mild central mitral  regurgitation after clip placement. Systolic flow in all pulmonary veins became systolic dominant after clip placement. MVA of the anterolateral orifice was 1.92 cm2 and the posteromedial orificed was 2.31 cm2 (Total 4.2 cm2). No significant stenosis was present. There was a small iatrogenic PFO after the procedure with L to R shunting. There was a small pericardial effusion anterior to the RV before and after the procedure without any increase in size. There no apparent complications during the procedure.  2. Left ventricular ejection fraction, by estimation, is 55 to 60%. The left ventricle has normal function. The left ventricle has no regional wall motion abnormalities.  3. Right ventricular systolic function is normal. The right ventricular size is normal.  4. Left atrial size was moderately dilated. No left atrial/left atrial appendage thrombus was detected. The LAA emptying velocity was 56 cm/s.  5. A small pericardial effusion is present. The pericardial effusion is circumferential.  6. The mitral valve is myxomatous. Severe mitral valve regurgitation. No evidence of mitral stenosis. There is a Mitra-Clip present in  the mitral position. Procedure Date: 01/20/2021.  7. The aortic valve is tricuspid. Aortic valve regurgitation is mild. Mild aortic valve sclerosis is present, with no evidence of aortic valve stenosis.  8. There is Moderate (Grade III) layered plaque involving the transverse aorta and descending aorta. FINDINGS  Left Ventricle: Left ventricular ejection fraction, by estimation, is 55 to 60%. The left ventricle has normal function. The left ventricle has no regional wall motion abnormalities. The left ventricular internal cavity size was normal in size. Right Ventricle: The right ventricular size is normal. No increase in right ventricular wall thickness. Right ventricular systolic function is normal. Left Atrium: Left atrial size was moderately dilated. No left atrial/left atrial appendage  thrombus was detected. The LAA emptying velocity was 56 cm/s. Right Atrium: Right atrial size was normal in size. Pericardium: A small pericardial effusion is present. The pericardial effusion is circumferential. Mitral Valve: The mitral valve is myxomatous. Severe mitral valve regurgitation. There is a Mitra-Clip present in the mitral position. Procedure Date: 01/20/2021. No evidence of mitral valve stenosis. MV peak gradient, 7.2 mmHg. The mean mitral valve gradient is 1.5 mmHg. Tricuspid Valve: The tricuspid valve is normal in structure. Tricuspid valve regurgitation is mild . No evidence of tricuspid stenosis. Aortic Valve: The aortic valve is tricuspid. Aortic valve regurgitation is mild. Mild aortic valve sclerosis is present, with no evidence of aortic valve stenosis. Pulmonic Valve: The pulmonic valve was normal in structure. Pulmonic valve regurgitation is trivial. No evidence of pulmonic stenosis. Aorta: The aortic root and ascending aorta are structurally normal, with no evidence of dilitation. There is moderate (Grade III) layered plaque involving the transverse aorta and descending aorta. IAS/Shunts: The atrial septum is grossly normal. EKG: Rhythm strip during this exam demostrated atrial fibrillation.  LEFT VENTRICLE PLAX 2D LVOT diam:     2.20 cm LV SV:         42 LV SV Index:   26 LVOT Area:     3.80 cm  AORTIC VALVE LVOT Vmax:   73.20 cm/s LVOT Vmean:  51.100 cm/s LVOT VTI:    0.110 m  AORTA Ao Root diam: 3.67 cm Ao Asc diam:  3.55 cm MITRAL VALVE                 TRICUSPID VALVE MV Area VTI:  1.18 cm       TR Peak grad:   15.8 mmHg MV Peak grad: 7.2 mmHg       TR Vmax:        199.00 cm/s MV Mean grad: 1.5 mmHg MV Vmax:      1.34 m/s       SHUNTS MV Vmean:     53.0 cm/s      Systemic VTI:  0.11 m MR Peak grad:    83.2 mmHg   Systemic Diam: 2.20 cm MR Mean grad:    53.0 mmHg MR Vmax:         456.00 cm/s MR Vmean:        347.0 cm/s MR PISA:         6.28 cm MR PISA Eff ROA: 53 mm MR PISA Radius:   1.00 cm Madras O'Neal MD Electronically signed by Kamiah O'Neal MD Signature Date/Time: 01/20/2021/6:24:23 PM    Final     Cardiac Studies   Echo 01/21/21: 1. Left ventricular ejection fraction, by estimation, is 60 to 65%. The  left ventricle has normal function. The left ventricle has no regional  wall motion abnormalities.   There is severe left ventricular hypertrophy of  the basal-septal segment. Left  ventricular diastolic function could not be evaluated. Elevated left  ventricular end-diastolic pressure.   2. Right ventricular systolic function is normal. The right ventricular  size is normal. Tricuspid regurgitation signal is inadequate for assessing  PA pressure.   3. Left atrial size was severely dilated.   4. Right atrial size was mildly dilated.   5. The mitral valve has been repaired/replaced. There is a Mitra-Clip  present in the mitral position. Trivial mitral valve regurgitation. No  evidence of mitral stenosis. The mean mitral valve gradient is 3.0 mmHg.   6. The aortic valve was not well visualized. Aortic valve regurgitation  is mild. Mild aortic valve sclerosis is present, with no evidence of  aortic valve stenosis. Aortic regurgitation PHT measures 522 msec. Aortic  valve area, by VTI measures 2.49 cm.  Aortic valve mean gradient measures 3.0 mmHg. Aortic valve Vmax measures  1.19 m/s.   7. Aortic dilatation noted. There is mild dilatation of the aortic root,  measuring 40 mm.   8. Evidence of atrial level shunting detected by color flow Doppler.  There is a small patent foramen ovale.   Patient Profile     80 y.o. female with CAD s/p recent PCI and severe degenerative MR presents 6/9 for MitraClip procedure, complicated by post-op hemopericardium with tamponade  Assessment & Plan    Severe non-rheumatic MR: s/p edge-to-edge repair with MitraClip with good MR reduction. Echo yesterday shows trivial residual MR. Continues on clopidogrel after recent PCI.   Hemopericardium with cardiac tamponade: traumatic, post-procedure. 400 cc drained via pericardiocentesis with resolution of cardiogenic shock. Pericardial drain removed yesterday. Minimal residual effusion seen on yesterday's echo. Resume anticoagulation tonight once 48 hours out from event. Will need limited 2D echo at outpatient follow-up after back on anticoagulation for 4-5 days.  CAD s/p recent PCI: continue clopidogrel, no angina.  Persistent atrial fibrillation: apixaban has been on hold. Ventricular rate controlled. Pt with hx of stroke off apixaban. Balance difficult with high stroke risk and need to resume anticoagulation after hemopericardium. Favor resume apixaban this evening after full 48 hours from bleeding event. Reduce metoprolol dose since BP borderline. Respiratory failure: secondary to hemodynamic instability in setting acute cardiac tamponade. Extubated yesterday, progressing very well.  Anemia, acute blood loss: HgB down to 8.8 this am. No active bleeding at this point. Repeat in am.   Dispo: tx tele today, likely home next 24-48 hours.  For questions or updates, please contact [facility] Please consult [email or web address] for contact info under   Signed, Michael Cooper, MD  01/22/2021, 7:03 AM    

## 2021-01-23 ENCOUNTER — Inpatient Hospital Stay (HOSPITAL_COMMUNITY): Payer: Medicare HMO

## 2021-01-23 ENCOUNTER — Other Ambulatory Visit (HOSPITAL_COMMUNITY): Payer: Medicare HMO

## 2021-01-23 DIAGNOSIS — I351 Nonrheumatic aortic (valve) insufficiency: Secondary | ICD-10-CM

## 2021-01-23 DIAGNOSIS — I480 Paroxysmal atrial fibrillation: Secondary | ICD-10-CM

## 2021-01-23 DIAGNOSIS — I340 Nonrheumatic mitral (valve) insufficiency: Secondary | ICD-10-CM

## 2021-01-23 DIAGNOSIS — I5032 Chronic diastolic (congestive) heart failure: Secondary | ICD-10-CM

## 2021-01-23 LAB — BASIC METABOLIC PANEL
Anion gap: 10 (ref 5–15)
BUN: 21 mg/dL (ref 8–23)
CO2: 25 mmol/L (ref 22–32)
Calcium: 9 mg/dL (ref 8.9–10.3)
Chloride: 104 mmol/L (ref 98–111)
Creatinine, Ser: 0.99 mg/dL (ref 0.44–1.00)
GFR, Estimated: 57 mL/min — ABNORMAL LOW (ref >60)
Glucose, Bld: 130 mg/dL — ABNORMAL HIGH (ref 70–99)
Potassium: 3.8 mmol/L (ref 3.5–5.1)
Sodium: 139 mmol/L (ref 135–145)

## 2021-01-23 LAB — ECHOCARDIOGRAM LIMITED
Height: 66 in
Weight: 1957.68 oz

## 2021-01-23 LAB — HEMOGLOBIN AND HEMATOCRIT, BLOOD
HCT: 29.6 % — ABNORMAL LOW (ref 36.0–46.0)
Hemoglobin: 9.9 g/dL — ABNORMAL LOW (ref 12.0–15.0)

## 2021-01-23 MED ORDER — METOPROLOL TARTRATE 25 MG PO TABS
25.0000 mg | ORAL_TABLET | Freq: Two times a day (BID) | ORAL | Status: DC
  Administered 2021-01-23 – 2021-01-24 (×3): 25 mg via ORAL
  Filled 2021-01-23 (×2): qty 1

## 2021-01-23 NOTE — Progress Notes (Addendum)
Progress Note  Patient Name: Teresa Hughes Date of Encounter: 01/23/2021  [facility] Cardiologist: Shirlee More, MD   Subjective   Feeling okay this morning. Breathing not quite back to baseline but improving. No complaints of chest pain or LE edema. No complaints of palpitations.   Inpatient Medications    Scheduled Meds:  allopurinol  300 mg Oral QHS   apixaban  2.5 mg Oral BID   clopidogrel  75 mg Oral Daily   digoxin  0.0625 mg Oral Daily   docusate sodium  100 mg Oral Daily   furosemide  40 mg Oral Daily   metoprolol tartrate  25 mg Oral BID   mirtazapine  7.5 mg Oral QHS   pantoprazole  40 mg Oral Daily   polyethylene glycol  17 g Oral Daily   potassium chloride  20 mEq Oral BID   rosuvastatin  20 mg Oral QHS   sodium chloride flush  3 mL Intravenous Q12H   Continuous Infusions:  sodium chloride     PRN Meds: sodium chloride, acetaminophen, ondansetron (ZOFRAN) IV, phenol   Vital Signs    Vitals:   01/23/21 0056 01/23/21 0458 01/23/21 0723 01/23/21 1112  BP:  131/73 132/70 (!) 146/76  Pulse:  72 71 78  Resp:  18 20 20   Temp:  98.5 F (36.9 C) 98 F (36.7 C) 98.1 F (36.7 C)  TempSrc:  Oral Oral Oral  SpO2:  95% 94% 95%  Weight: 55.5 kg     Height:        Intake/Output Summary (Last 24 hours) at 01/23/2021 1135 Last data filed at 01/23/2021 0819 Gross per 24 hour  Intake 760 ml  Output 900 ml  Net -140 ml   Last 3 Weights 01/23/2021 01/22/2021 01/22/2021  Weight (lbs) 122 lb 5.7 oz 126 lb 12.2 oz 123 lb 14.4 oz  Weight (kg) 55.5 kg 57.5 kg 56.2 kg      Telemetry    Atrial fibrillation with rates 90s-100s, frequent PVCs - Personally Reviewed  ECG    No new tracings - Personally Reviewed  Physical Exam   GEN: No acute distress.   Neck: No JVD Cardiac: RRR, no murmurs, rubs, or gallops.  Respiratory: Clear to auscultation bilaterally. GI: Soft, nontender, non-distended  MS: No edema; No deformity. Neuro:  Nonfocal  Psych: Normal  affect   Labs    High Sensitivity Troponin:   Recent Labs  Lab 01/20/21 1504 01/20/21 1657 01/22/21 0551  TROPONINIHS 278* 267* 156*      Chemistry Recent Labs  Lab 01/18/21 1053 01/20/21 1504 01/21/21 0123 01/21/21 1150 01/23/21 0950  NA 140   < > 143 143 139  K 4.1   < > 4.2 3.9 3.8  CL 106   < > 109 111 104  CO2 23   < > 25 24 25   GLUCOSE 125*   < > 167* 139* 130*  BUN 17   < > 16 22 21   CREATININE 1.02*   < > 1.03* 1.12* 0.99  CALCIUM 9.2   < > 8.2* 7.6* 9.0  PROT 6.4*  --   --  4.7*  --   ALBUMIN 3.8  --   --  2.8*  --   AST 24  --   --  32  --   ALT 17  --   --  18  --   ALKPHOS 58  --   --  38  --   BILITOT 1.0  --   --  1.0  --   GFRNONAA 55*   < > 54* 49* 57*  ANIONGAP 11   < > 9 8 10    < > = values in this interval not displayed.     Hematology Recent Labs  Lab 01/20/21 1504 01/20/21 1801 01/21/21 0123 01/22/21 0247 01/23/21 0950  WBC 13.0*  --  9.6 9.7  --   RBC 3.38*  --  3.18* 2.74*  --   HGB 10.7*   < > 10.0* 8.8* 9.9*  HCT 32.7*   < > 30.2* 26.7* 29.6*  MCV 96.7  --  95.0 97.4  --   MCH 31.7  --  31.4 32.1  --   MCHC 32.7  --  33.1 33.0  --   RDW 14.1  --  14.1 14.5  --   PLT 162  --  153 124*  --    < > = values in this interval not displayed.    BNP Recent Labs  Lab 01/20/21 1504 01/21/21 0959  BNP 168.8* 259.3*     DDimer No results for input(s): DDIMER in the last 168 hours.   Radiology    No results found.  Cardiac Studies   Echocardiogram 01/21/21: 1. Left ventricular ejection fraction, by estimation, is 60 to 65%. The  left ventricle has normal function. The left ventricle has no regional  wall motion abnormalities. There is severe left ventricular hypertrophy of  the basal-septal segment. Left  ventricular diastolic function could not be evaluated. Elevated left  ventricular end-diastolic pressure.   2. Right ventricular systolic function is normal. The right ventricular  size is normal. Tricuspid regurgitation  signal is inadequate for assessing  PA pressure.   3. Left atrial size was severely dilated.   4. Right atrial size was mildly dilated.   5. The mitral valve has been repaired/replaced. There is a Mitra-Clip  present in the mitral position. Trivial mitral valve regurgitation. No  evidence of mitral stenosis. The mean mitral valve gradient is 3.0 mmHg.   6. The aortic valve was not well visualized. Aortic valve regurgitation  is mild. Mild aortic valve sclerosis is present, with no evidence of  aortic valve stenosis. Aortic regurgitation PHT measures 522 msec. Aortic  valve area, by VTI measures 2.49 cm.  Aortic valve mean gradient measures 3.0 mmHg. Aortic valve Vmax measures  1.19 m/s.   7. Aortic dilatation noted. There is mild dilatation of the aortic root,  measuring 40 mm.   8. Evidence of atrial level shunting detected by color flow Doppler.  There is a small patent foramen ovale.    Patient Profile     80 y.o. female with a PMH of CAD s/p PCI/DES to RCA 12/6385, chronic diastolic heart failure, permanent atrial fibrillation, severe mitral regurgitation, and GERD, who presented for mitraclip procedure this admission.   Assessment & Plan    Severe mitral regurgitation s/p MitraClip 01/20/21: patient underwent mitraclip procedure 01/20/21 for management of severe MR with good reduction in MR, down to trivial on post-op echo. Post procedure complicated by hemopericardium with cardiac tamponade managed with pericardiocentesis.  - Continue plavix - Continue po lasix  Hemopericardium: occurred shortly after MitraClip procedure. Underwent pericardiocentesis for management of cardiac tamponade with 400cc drined with resolution of cardiogenic shock. Drin removed 01/21/21.  - Will update limited echo today for close monitoring   CAD s/p PCI/DES to RCA 12/2020: no anginal complaints. Not on aspirin given need for anticoagulation - Continue plavix - Continue statin - Continue  Bblocker  Chronic diastolic CHF: appears euvolemic - Continue metoprolol, digoxin, and lasix - Continue to monitor strict I&Os and daily weights  Permanent atrial fibrillation: rates stable in the 90s-100s.  - Continue metoprolol and digoxin for rate control - Continue apixaban for stroke ppx  AMS: occurred in the setting of hypotension and cardiac tamponade. Neurology consulted an CT head showed prior R MCA stroke in 2018 and prior R insular infarct but no acute findings. Home eliquis restarted - Continue to monitor  Anemia: Hgb dropped 8.8 likely from hemopericardium, from baseline 12. Up to 9.9 today. No active bleeding - Continue to monitor  For questions or updates, please contact [place] Please consult [email or web address] for contact info under        Signed, Abigail Butts, PA-C  01/23/2021, 11:35 AM    Patient examined chart reviewed Know patient well from previous resuscitation MS normal no signs stroke. Lungs clear. Pericardiocentesis site, RFV/LFV catheter sites are good with no hematoma. Hct drifting down a touch 29.6 restarted on eliquis last night as she has chronic afib with prior CVA off anticoagulation However had LA perforation and hemopericardium BP softer. Will order limited echo to reassess for effusion Only AV sclerosis murmur on exam. Discussed with family Likely d/c in am if Hct stable and no recurrent effusion on TTE  Jenkins Rouge MD [facility]

## 2021-01-23 NOTE — Progress Notes (Signed)
  Echocardiogram 2D Echocardiogram has been performed.  Teresa Hughes 01/23/2021, 5:45 PM

## 2021-01-24 ENCOUNTER — Encounter: Payer: Self-pay | Admitting: Physician Assistant

## 2021-01-24 ENCOUNTER — Encounter (HOSPITAL_COMMUNITY): Payer: Self-pay | Admitting: Cardiovascular Disease

## 2021-01-24 ENCOUNTER — Other Ambulatory Visit: Payer: Self-pay | Admitting: Physician Assistant

## 2021-01-24 DIAGNOSIS — I312 Hemopericardium, not elsewhere classified: Secondary | ICD-10-CM

## 2021-01-24 DIAGNOSIS — I482 Chronic atrial fibrillation, unspecified: Secondary | ICD-10-CM

## 2021-01-24 DIAGNOSIS — Z9889 Other specified postprocedural states: Secondary | ICD-10-CM

## 2021-01-24 DIAGNOSIS — Z95818 Presence of other cardiac implants and grafts: Secondary | ICD-10-CM

## 2021-01-24 DIAGNOSIS — I314 Cardiac tamponade: Secondary | ICD-10-CM

## 2021-01-24 DIAGNOSIS — I340 Nonrheumatic mitral (valve) insufficiency: Secondary | ICD-10-CM

## 2021-01-24 DIAGNOSIS — I3139 Other pericardial effusion (noninflammatory): Secondary | ICD-10-CM

## 2021-01-24 HISTORY — DX: Cardiac tamponade: I31.4

## 2021-01-24 HISTORY — DX: Hemopericardium, not elsewhere classified: I31.2

## 2021-01-24 LAB — GLUCOSE, CAPILLARY: Glucose-Capillary: 113 mg/dL — ABNORMAL HIGH (ref 70–99)

## 2021-01-24 NOTE — Plan of Care (Signed)
  Problem: Acute Rehab PT Goals(only PT should resolve) Goal: Pt Will Go Supine/Side To Sit Outcome: Adequate for Discharge Goal: Patient Will Transfer Sit To/From Stand Outcome: Adequate for Discharge Goal: Pt Will Perform Standing Balance Or Pre-Gait Outcome: Adequate for Discharge Goal: Pt Will Ambulate Outcome: Adequate for Discharge   Problem: Acute Rehab OT Goals (only OT should resolve) Goal: Pt. Will Perform Grooming Outcome: Adequate for Discharge Goal: Pt. Will Transfer To Toilet Outcome: Adequate for Discharge Goal: Pt. Will Perform Toileting-Clothing Manipulation Outcome: Adequate for Discharge Goal: Pt. Will Perform Tub/Shower Transfer Outcome: Adequate for Discharge

## 2021-01-24 NOTE — Progress Notes (Signed)
D/C instructions given and reviewed. Tele and IV removed, tolerated well. Family at bedside to transport home. 

## 2021-01-24 NOTE — Progress Notes (Signed)
CARDIAC REHAB PHASE I   PRE:  Rate/Rhythm: 87 afib with PVCs    BP: sitting 157/76    SaO2: 95 RA  MODE:  Ambulation: 470 ft   POST:  Rate/Rhythm: 105 afib with PVCs    BP: sitting 139/110, recheck 157/110     SaO2: 96 RA  Pt out of bed and ambulated with contact guard. Denied c/o. Fairly steady. No c/o SOB, which is much improved from before mitraclip (when I saw her after stent). Reviewed walking, restrictions, and CRPII.  [place], ACSM 01/24/2021 8:45 AM

## 2021-01-24 NOTE — Discharge Instructions (Signed)
Home Care Following Your MitraClip Procedure      If you have any questions or concerns you can call the structural heart office at [phone] during normal business hours 8am-4pm. If you have an urgent need after hours or on the weekend, please call [phone] to talk to the on call provider for general cardiology. If you have an emergency that requires immediate attention, please call 911.   Groin Site Care Refer to this sheet in the next few weeks. These instructions provide you with information on caring for yourself after your procedure. Your caregiver may also give you more specific instructions. Your treatment has been planned according to current medical practices, but problems sometimes occur. Call your caregiver if you have any problems or questions after your procedure. HOME CARE INSTRUCTIONS  You may shower 24 hours after the procedure. Remove the bandage (dressing) and gently wash the site with plain soap and water. Gently pat the site dry.   Do not apply powder or lotion to the site.   Do not sit in a bathtub, swimming pool, or whirlpool for 5 to 7 days.   No bending, squatting, or lifting anything over 10 pounds (4.5 kg) as directed by your caregiver.   Inspect the site at least twice daily.   Do not drive home if you are discharged the same day of the procedure. Have someone else drive you.   You may drive 72 hours after the procedure unless otherwise instructed by your caregiver.  What to expect:  Any bruising will usually fade within 1 to 2 weeks.   Blood that collects in the tissue (hematoma) may be painful to the touch. It should usually decrease in size and tenderness within 1 to 2 weeks.  SEEK IMMEDIATE MEDICAL CARE IF:  You have unusual pain at the groin site or down the affected leg.   You have redness, warmth, swelling, or pain at the groin site.   You have drainage (other than a small amount of blood on the dressing).   You have chills.   You have a  fever or persistent symptoms for more than 72 hours.   You have a fever and your symptoms suddenly get worse.   Your leg becomes pale, cool, tingly, or numb.   You have bleeding from the site. Hold pressure on the site until it subsides.    After MitraClip Checklist  Check  Test Description   Follow up appointment in 1-2 weeks  Most of our patients will see our structural heart physician assistant, Katie Caylee Vlachos, or your primary cardiologist within 1-2 weeks. Your incision site will be checked and you will be cleared to resume all normal activities if you are doing well.     1 month echo and follow up  You will have an echo to check on your heart valve clip and be seen back in the office by Katie Isamar Wellbrock PA-C.   Follow up with your primary cardiologist You will need to be seen by your primary cardiologist in the following 3-6 months after your 1 month appointment in the valve clinic. Often times your Plavix or Aspirin will be discontinued during this time, but this is decided on a case by case basis.    1 year echo and follow up You will have another echo to check on your heart valve after one year and be seen back in the office by Katie Ruby Dilone. This your last structural heart visit.   Bacterial endocarditis prophylaxis  You will   have to take antibiotics for the rest of your life before all dental procedures (even dental cleanings) to protect your heart valve from potential infection. Antibiotics are also required before some surgeries. Please check with your cardiologist before scheduling any surgeries. Also, please make sure to tell us if you have a penicillin allergy as you will require an alternative antibiotic.    ______________  Your Implant Identification Card Following your procedure, you will receive an Implant Identification Card, which your doctor will fill out and which you must carry with you at all times. Show your Implant Identification Card if you report to an emergency room.  This card identifies you as a patient who has had a MitraClip device implanted. If you require a magnetic resonance imaging (MRI) scan, tell your doctor or MRI technician that you have a MitraClip device implanted. Test results indicate that patients with the MitraClip device can safely undergo MRI scans under certain conditions described on the card.  

## 2021-01-24 NOTE — Progress Notes (Signed)
Occupational Therapy Treatment and Discharge  Patient Details Name: Teresa Hughes MRN: [id number] DOB: 11/17/1940 Today's Date: 01/24/2021    History of present illness 80 yo admitted 6/9 for mitraclip with hypotension post procedure in cath lab holding with intubation, urgent cath with pericardiocentesis and lack of movement concerning for CVA with improvement after neuromuscular blockade reversal. Extubated 6/10. PMHx: mitral insufficiency Afib, CVA, HF, CAD, CKD   OT comments  Patient demonstrating modified independence with LB dressing, grooming, transfers and toileting. Reviewed energy conservation techniques and safety.  No further OT needs identified at this time.  OT will sign off.    Follow Up Recommendations  No OT follow up;Supervision - Intermittent    Equipment Recommendations  None recommended by OT    Recommendations for Other Services      Precautions / Restrictions Precautions Precautions: Fall Restrictions Weight Bearing Restrictions: No       Mobility Bed Mobility               General bed mobility comments: OOB in recliner upon entry    Transfers Overall transfer level: Modified independent                    Balance Overall balance assessment: Mild deficits observed, not formally tested                                         ADL either performed or assessed with clinical judgement   ADL Overall ADL's : Modified independent                                       General ADL Comments: pt demonstrates ability to complete LB dressing, toilet transfers, toileting and grooming with modified independence.     Vision       Perception     Praxis      Cognition Arousal/Alertness: Awake/alert Behavior During Therapy: WFL for tasks assessed/performed Overall Cognitive Status: Within Functional Limits for tasks assessed                                          Exercises      Shoulder Instructions       General Comments on RA, VSS; discussed energy conservation technqiues    Pertinent Vitals/ Pain       Pain Assessment: No/denies pain  Home Living                                          Prior Functioning/Environment              Frequency  Min 2X/week        Progress Toward Goals  OT Goals(current goals can now be found in the care plan section)  Progress towards OT goals: Goals met/education completed, patient discharged from OT  Acute Rehab OT Goals Patient Stated Goal: home today OT Goal Formulation: With patient  Plan All goals met and education completed, patient discharged from OT services    Co-evaluation                   AM-PAC OT "6 Clicks" Daily Activity     Outcome Measure   Help from another person eating meals?: None Help from another person taking care of personal grooming?: None Help from another person toileting, which includes using toliet, bedpan, or urinal?: None Help from another person bathing (including washing, rinsing, drying)?: None Help from another person to put on and taking off regular upper body clothing?: None Help from another person to put on and taking off regular lower body clothing?: None 6 Click Score: 24    End of Session    OT Visit Diagnosis: Other abnormalities of gait and mobility (R26.89);Muscle weakness (generalized) (M62.81)   Activity Tolerance Patient tolerated treatment well   Patient Left in chair;with call bell/phone within reach   Nurse Communication Mobility status        Time: 1000-1010 OT Time Calculation (min): 10 min  Charges: OT General Charges $OT Visit: 1 Visit OT Treatments $Self Care/Home Management : 8-22 mins  Jolaine Artist, OT [place] Pager [phone] Office [phone]    Delight Stare 01/24/2021, 10:34 AM

## 2021-01-24 NOTE — Discharge Summary (Addendum)
[facility] TE[facility]  Discharge Summary    Patient ID: Teresa Hughes MRN: [id number]; DOB: 12-Jun-1941  Admit date: 01/20/2021 Discharge date: 01/24/2021  Primary Care Provider: Nicoletta Dress, MD  Primary Cardiologist: Shirlee More, MD   Discharge Diagnoses    Principal Problem:   S/P mitral valve clip implantation Active Problems:   Chronic atrial fibrillation (HCC)   Nonrheumatic mitral valve regurgitation   Severe mitral insufficiency   Pericardial effusion   Shock circulatory (HCC)   Cardiac tamponade   Hemopericardium   Allergies Allergies  Allergen Reactions   Propranolol Other (See Comments)    Bradycardia   Sulfamethoxazole Swelling   Codeine Nausea And Vomiting   Latex Itching and Rash    Diagnostic Studies/Procedures    01/20/21 Conclusion  Successful transcatheter edge-to-edge mitral valve repair using a MitraClip G4 XT W device, positioned A2 P2, reducing mitral regurgitation from severe (4+) at baseline to mild (1+) post procedure.  Recommendations  Antiplatelet/Anticoag Recommend to resume Apixaban, at currently prescribed dose and frequency on 01/20/2021. Recommend concurrent antiplatelet therapy of Clopidogrel 75mg  daily for 3 months.   _____________    Echo 01/21/21 IMPRESSIONS   1. Left ventricular ejection fraction, by estimation, is 60 to 65%. The  left ventricle has normal function. The left ventricle has no regional  wall motion abnormalities. There is severe left ventricular hypertrophy of  the basal-septal segment. Left  ventricular diastolic function could not be evaluated. Elevated left  ventricular end-diastolic pressure.   2. Right ventricular systolic function is normal. The right ventricular  size is normal. Tricuspid regurgitation signal is inadequate for assessing  PA pressure.   3. Left atrial size was severely dilated.   4. Right atrial size was mildly dilated.   5. The mitral  valve has been repaired/replaced. There is a Mitra-Clip  present in the mitral position. Trivial mitral valve regurgitation. No  evidence of mitral stenosis. The mean mitral valve gradient is 3.0 mmHg.   6. The aortic valve was not well visualized. Aortic valve regurgitation  is mild. Mild aortic valve sclerosis is present, with no evidence of  aortic valve stenosis. Aortic regurgitation PHT measures 522 msec. Aortic  valve area, by VTI measures 2.49 cm.  Aortic valve mean gradient measures 3.0 mmHg. Aortic valve Vmax measures  1.19 m/s.   7. Aortic dilatation noted. There is mild dilatation of the aortic root,  measuring 40 mm.   8. Evidence of atrial level shunting detected by color flow Doppler.  There is a small patent foramen ovale.   __________________  Echo 01/23/21 IMPRESSIONS   1. Left ventricular ejection fraction, by estimation, is 55 to 60%. The  left ventricle has normal function. Left ventricular endocardial border  not optimally defined to evaluate regional wall motion. Left ventricular  diastolic function could not be  evaluated.   2. The mitral valve has been repaired/replaced. There is a Mitra-Clip  present in the mitral position. Trivial mitral valve regurgitation.   3. The aortic valve is tricuspid. Aortic valve regurgitation is moderate.  Mild to moderate aortic valve sclerosis/calcification is present, without  any evidence of aortic stenosis.   4. No pericardial effusion noted.   History of Present Illness     Teresa Hughes is a 80 y.o. 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  Patient states that she is been known of presence of a heart murmur since she was young.  She denies any previous history of coronary artery disease.  She has history of previous stroke with some residual and history of longstanding persistent atrial fibrillation currently anticoagulated using Eliquis.  She has long history of shortness of breath without chest discomfort dating back at least 5 or 6 years but which has progressed substantially over the past 6 to 12 months.  Follow-up transthoracic echocardiogram performed July 13, 2020 revealed normal left ventricular systolic function with ejection fraction estimated 60 to 65%.  There was mitral valve prolapse with moderate to severe mitral regurgitation.  Transesophageal echocardiogram was recommended.  Patient underwent TEE on September 14, 2020 which confirmed the presence of mitral valve prolapse with severe mitral regurgitation.  There was low normal left ventricular function with ejection fraction estimated only 50 to 55% in the setting of severe mitral regurgitation.  There was severe holosystolic prolapse involving a portion of the middle scallop of the posterior leaflet.  There was severe left atrial enlargement.  The patient was referred to the multidisciplinary heart valve clinic and has been evaluated previously by Dr. Burt Knack who discussed the potential role of transcatheter edge-to-edge repair.  Diagnostic cardiac catheterization was performed October 29, 2020.  Catheterization revealed severe multivessel coronary artery disease including long segment severe stenosis of the mid right coronary artery and 75% proximal stenosis of the left anterior descending coronary artery with mild nonobstructive disease in the left circumflex territory.  Right heart pressures were very mildly elevated. She later underwent successful atherectomy and stenting of severe, calcified, complex stenoses in the mid  RCA using balloon angioplasty, orbital atherectomy, and stenting on 12/22/20. She was treated with aspirin x1 week, Plavix x 6 months and resumed on Eliquis 2.5mg  BID.    The patient has been evaluated by the multidisciplinary valve team and felt to have severe, symptomatic mitral regurgitation and to be a suitable candidate for TEER, which was set up for 01/20/21.  Hospital Course     Consultants: PCCM,  neurology  Severe non-rheumatic MR: s/p edge-to-edge repair with MitraClip with good MR reduction. POD1 echo showed trivial residual MR and a mean gradient of 3 mm hg. Continued on clopidogrel after recent PCI. Resumed on home Eliquis 48 hours out from hemopericardium. Plan for discharge home today with close follow up in the office on Thursday.   Hemopericardium with cardiac tamponade: traumatic, post-procedure. 400 cc drained via pericardiocentesis with resolution of cardiogenic shock. Pericardial drain removed 6/10. Minimal residual effusion seen on POD1 echo and resolved on limited echo 6/12. Resumed on anticoagulation 48 hours out from event on 6/11 pm. Limited 2D echo scheduled outpatient on 6/16 (which is 5 days after being resumed on her [place]).   Respiratory failure: secondary to hemodynamic instability in setting acute cardiac tamponade. Extubated 6/10 with clinical improvement.   Acute blood loss anemia: HgB down to 8.8 which improved to 9.9 at discharge. No s/s active bleeding.  CAD s/p recent PCI: continue clopidogrel, no angina. No aspirin given Eliquis   Persistent atrial fibrillation: ventricular rate controlled. Pt with hx of stroke off Eliquis so this was resumed as soon as felt safe from a hemopericardium standpoint (48 hours out from bleeding event). Home Lopressor  held given borderline BPs  HTN: BPs soft. Will hold home Lopressor 50 mg BID at discharge. I will add it back based on clinical status this Thursday.   AMS: occurred in the setting of hypotension and cardiac  tamponade. Neurology consulted an CT head showed prior R MCA stroke in 2018 and prior R insular infarct but no acute findings. Home eliquis restarted _____________  Discharge Vitals Blood pressure 136/73, pulse (!) 46, temperature 98.3 F (36.8 C), resp. rate 16, height 5\' 6"  (1.676 m), weight 53.6 kg, SpO2 95 %.  Filed Weights   01/22/21 1434 01/23/21 0056 01/24/21 0512  Weight: 57.5 kg 55.5 kg 53.6 kg    GEN: Well nourished, well developed, in no acute distress HEENT: normal Neck: no JVD or masses Cardiac: irreg irreg, no murmurs, rubs, or gallops,no edema  Respiratory:  clear to auscultation bilaterally, normal work of breathing GI: soft, nontender, nondistended, + BS MS: no deformity or atrophy Skin: warm and dry, no rash Neuro:  Alert and Oriented x 3, Strength and sensation are intact Psych: euthymic mood, full affect   Labs & Radiologic Studies    CBC Recent Labs    01/22/21 0247 01/23/21 0950  WBC 9.7  --   HGB 8.8* 9.9*  HCT 26.7* 29.6*  MCV 97.4  --   PLT 124*  --    Basic Metabolic Panel Recent Labs    01/22/21 0247 01/23/21 0950  NA  --  139  K  --  3.8  CL  --  104  CO2  --  25  GLUCOSE  --  130*  BUN  --  21  CREATININE  --  0.99  CALCIUM  --  9.0  MG 2.2  --    Liver Function Tests No results for input(s): AST, ALT, ALKPHOS, BILITOT, PROT, ALBUMIN in the last 72 hours.  No results for input(s): LIPASE, AMYLASE in the last 72 hours. Cardiac Enzymes No results for input(s): CKTOTAL, CKMB, CKMBINDEX, TROPONINI in the last 72 hours. BNP Invalid input(s): POCBNP D-Dimer No results for input(s): DDIMER in the last 72 hours. Hemoglobin A1C No results for input(s): HGBA1C in the last 72 hours. Fasting Lipid Panel No results for input(s): CHOL, HDL, LDLCALC, TRIG, CHOLHDL, LDLDIRECT in the last 72 hours. Thyroid Function Tests No results for input(s): TSH, T4TOTAL, T3FREE, THYROIDAB in the last 72 hours.  Invalid input(s):  FREET3 _____________  DG Chest 2 View  Result Date: 01/19/2021 CLINICAL DATA:  Preoperative evaluation. EXAM: CHEST - 2 VIEW COMPARISON:  Jan 02, 2018 FINDINGS: The cardiac silhouette is mildly enlarged and unchanged in size. Mild calcification of the aortic arch is noted. Both lungs are clear. The visualized skeletal structures are unremarkable. IMPRESSION: Stable exam without active cardiopulmonary disease. Electronically Signed   By: Virgina Norfolk M.D.   On: 01/19/2021 19:43   CT HEAD WO CONTRAST  Result Date: 01/20/2021 CLINICAL DATA:  Hypotension and unresponsiveness. EXAM: CT HEAD WITHOUT CONTRAST TECHNIQUE: Contiguous axial images were obtained from the base of the skull through the vertex without intravenous contrast. COMPARISON:  08/27/2016 FINDINGS: Brain: Age related atrophy. No focal abnormality seen affecting the brainstem or cerebellum. Chronic small-vessel ischemic changes are present throughout the cerebral hemispheric white matter. 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 No sign of acute infarction, mass, hemorrhage, hydrocephalus or extra-axial collection. Vascular: There is atherosclerotic calcification of the major vessels at the base of the brain. Skull: Negative Sinuses/Orbits: Clear/normal Other: None IMPRESSION: No acute finding by CT. Atrophy and  extensive chronic small-vessel ischemic changes. 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 Electronically Signed   By: Nelson Chimes M.D.   On: 01/20/2021 17:20   CARDIAC CATHETERIZATION  Result Date: 01/20/2021  Successful pericardiocentesis from the subxiphoid approach with removal of 450 cc bloody fluid.  Drain will be left in place.  Further management based on the amount of drainage.   CARDIAC CATHETERIZATION  Result Date: 01/20/2021 Successful transcatheter edge-to-edge mitral valve repair using a MitraClip G4 XT W device, positioned A2 P2, reducing mitral regurgitation from severe (4+) at baseline to mild (1+) post  procedure.  Portable Chest x-ray  Result Date: 01/20/2021 CLINICAL DATA:  Endotracheal intubation EXAM: PORTABLE CHEST 1 VIEW COMPARISON:  01/18/2021 FINDINGS: Nasogastric tube enters the stomach. Endotracheal tube observed terminating just below the thoracic inlet, about 6.6 cm above the carina. The patient is rotated to the left on today's radiograph, reducing diagnostic sensitivity and specificity. Atherosclerotic calcification of the aortic arch. Indistinct opacity at the left lung base may be due to obliquity but could reflect left lower lobe airspace opacity. Narrow tubing projects over the left lower chest mitral clip noted. No visible pneumothorax. IMPRESSION: 1. Endotracheal tube is about at the thoracic inlet, 6.6 cm above the carina. 2. Retrocardiac airspace opacity is nonspecific but could be from atelectasis. 3. Narrow tubing projects over the cardiac shadow, possibly a pericardial drain, but overall nonspecific. Electronically Signed   By: Van Clines M.D.   On: 01/20/2021 16:48   ECHOCARDIOGRAM COMPLETE  Result Date: 01/21/2021    ECHOCARDIOGRAM REPORT   Patient Name:   Teresa Hughes Date of Exam: 01/21/2021 Medical Rec #:  [id number]        Height:       66.0 in Accession #:    [id number]       Weight:       125.4 lb Date of Birth:  07/08/1941        BSA:          1.640 m Patient Age:    30 years         BP:           119/69 mmHg Patient Gender: F                HR:           94 bpm. Exam Location:  Inpatient Procedure: 2D Echo, Cardiac Doppler and Color Doppler Indications:    S/P Mitral Valve Clip implantation  History:        Patient has prior history of Echocardiogram examinations, most                 recent 07/13/2020. CHF; Risk Factors:Hypertension.  Sonographer:    Cammy Brochure Referring Phys: [phone] Glendia Olshefski  Sonographer Comments: Echo performed with patient supine and on artificial respirator. Image acquisition challenging due to respiratory motion and bandages  obscured windows. IMPRESSIONS  1. Left ventricular ejection fraction, by estimation, is 60 to 65%. The left ventricle has normal function. The left ventricle has no regional wall motion abnormalities. There is severe left ventricular hypertrophy of the basal-septal segment. Left ventricular diastolic function could not be evaluated. Elevated left ventricular end-diastolic pressure.  2. Right ventricular systolic function is normal. The right ventricular size is normal. Tricuspid regurgitation signal is inadequate for assessing PA pressure.  3. Left atrial size was severely dilated.  4. Right atrial size was mildly dilated.  5. The mitral valve has been repaired/replaced. There is a  Mitra-Clip present in the mitral position. Trivial mitral valve regurgitation. No evidence of mitral stenosis. The mean mitral valve gradient is 3.0 mmHg.  6. The aortic valve was not well visualized. Aortic valve regurgitation is mild. Mild aortic valve sclerosis is present, with no evidence of aortic valve stenosis. Aortic regurgitation PHT measures 522 msec. Aortic valve area, by VTI measures 2.49 cm. Aortic valve mean gradient measures 3.0 mmHg. Aortic valve Vmax measures 1.19 m/s.  7. Aortic dilatation noted. There is mild dilatation of the aortic root, measuring 40 mm.  8. Evidence of atrial level shunting detected by color flow Doppler. There is a small patent foramen ovale. FINDINGS  Left Ventricle: Left ventricular ejection fraction, by estimation, is 60 to 65%. The left ventricle has normal function. The left ventricle has no regional wall motion abnormalities. The left ventricular internal cavity size was normal in size. There is  severe left ventricular hypertrophy of the basal-septal segment. Left ventricular diastolic function could not be evaluated due to atrial fibrillation. Left ventricular diastolic function could not be evaluated. Elevated left ventricular end-diastolic pressure. Right Ventricle: The right ventricular  size is normal. No increase in right ventricular wall thickness. Right ventricular systolic function is normal. Tricuspid regurgitation signal is inadequate for assessing PA pressure. Left Atrium: Left atrial size was severely dilated. Right Atrium: Right atrial size was mildly dilated. Pericardium: There is no evidence of pericardial effusion. Mitral Valve: The mitral valve has been repaired/replaced. Trivial mitral valve regurgitation. There is a Mitra-Clip present in the mitral position. No evidence of mitral valve stenosis. MV peak gradient, 11.4 mmHg. The mean mitral valve gradient is 3.0 mmHg. Tricuspid Valve: The tricuspid valve is normal in structure. Tricuspid valve regurgitation is trivial. No evidence of tricuspid stenosis. Aortic Valve: The aortic valve was not well visualized. Aortic valve regurgitation is mild. Aortic regurgitation PHT measures 522 msec. Mild aortic valve sclerosis is present, with no evidence of aortic valve stenosis. Aortic valve mean gradient measures  3.0 mmHg. Aortic valve peak gradient measures 5.7 mmHg. Aortic valve area, by VTI measures 2.49 cm. Pulmonic Valve: The pulmonic valve was normal in structure. Pulmonic valve regurgitation is mild. No evidence of pulmonic stenosis. Aorta: Aortic dilatation noted. There is mild dilatation of the aortic root, measuring 40 mm. Venous: The inferior vena cava was not well visualized. IAS/Shunts: Evidence of atrial level shunting detected by color flow Doppler. A small patent foramen ovale is detected.  LEFT VENTRICLE PLAX 2D LVIDd:         4.20 cm  Diastology LVIDs:         2.60 cm  LV e' medial:    3.12 cm/s LV PW:         1.20 cm  LV E/e' medial:  46.5 LV IVS:        1.50 cm  LV e' lateral:   5.87 cm/s LVOT diam:     1.90 cm  LV E/e' lateral: 24.7 LV SV:         55 LV SV Index:   33 LVOT Area:     2.84 cm  RIGHT VENTRICLE RV Basal diam:  3.70 cm RV S prime:     8.62 cm/s LEFT ATRIUM              Index       RIGHT ATRIUM           Index  LA diam:        4.00 cm  2.44 cm/m  RA Area:     18.30 cm LA Vol (A2C):   141.0 ml 85.97 ml/m RA Volume:   49.80 ml  30.36 ml/m LA Vol (A4C):   123.0 ml 74.99 ml/m LA Biplane Vol: 133.0 ml 81.09 ml/m  AORTIC VALVE AV Area (Vmax):    2.35 cm AV Area (Vmean):   2.07 cm AV Area (VTI):     2.49 cm AV Vmax:           119.00 cm/s AV Vmean:          85.333 cm/s AV VTI:            0.220 m AV Peak Grad:      5.7 mmHg AV Mean Grad:      3.0 mmHg LVOT Vmax:         98.50 cm/s LVOT Vmean:        62.400 cm/s LVOT VTI:          0.193 m LVOT/AV VTI ratio: 0.88 AI PHT:            522 msec  AORTA Ao Root diam: 4.00 cm Ao Asc diam:  3.20 cm MITRAL VALVE MV Area (PHT): 1.98 cm     SHUNTS MV Area VTI:   1.22 cm     Systemic VTI:  0.19 m MV Peak grad:  11.4 mmHg    Systemic Diam: 1.90 cm MV Mean grad:  3.0 mmHg MV Vmax:       1.69 m/s MV Vmean:      83.8 cm/s MV Decel Time: 384 msec MV E velocity: 145.00 cm/s Fransico Him MD Electronically signed by Fransico Him MD Signature Date/Time: 01/21/2021/11:48:30 AM    Final    ECHO TEE  Result Date: 01/20/2021    TRANSESOPHOGEAL ECHO REPORT   Patient Name:   Teresa Hughes Date of Exam: 01/20/2021 Medical Rec #:  [id number]        Height:       66.0 in Accession #:    [id number]       Weight:       122.1 lb Date of Birth:  1941/01/26        BSA:          1.622 m Patient Age:    50 years         BP:           190/61 mmHg Patient Gender: F                HR:           85 bpm. Exam Location:  Inpatient Procedure: Transesophageal Echo and 3D Echo Indications:     I34.0 Nonrheumatic mitral (valve) insufficiency  History:         Patient has prior history of Echocardiogram examinations, most                  recent 09/14/2020. CHF, Arrythmias:Atrial Fibrillation and SVT;                  Risk Factors:Hypertension.                   Mitral Valve: Mitra-Clip valve is present in the mitral                  position. Procedure Date: 01/20/2021.  Sonographer:     Raquel Sarna Senior RDCS Referring Phys:   [place] Diagnosing Phys: Eleonore Chiquito MD  Sonographer Comments: 1 MitraClip  placed A2P2 PROCEDURE: After discussion of the risks and benefits of a TEE, an informed consent was obtained from the patient. The transesophogeal probe was passed without difficulty through the esophogus of the patient. Sedation performed by different physician. Image quality was excellent. The patient's vital signs; including heart rate, blood pressure, and oxygen saturation; remained stable throughout the procedure. The patient developed no complications during the procedure. IMPRESSIONS  1. Echo guided mitraclip procedure. Severe mitral regurgitation was present due to a flail P2 segment. 2D PISA radius 1.0, ERO 0.53 cm2, R vol 84 cc. IAS puncture was directly visualized on echo. An XTW mitraclip was placed in the A2-P2 position. The posteromedial orifice had a MG of 2 mmHG @ 109 bpm. The anterolateral orifice had a MG of 2 mmHG @ 70 bpm. There was mild central mitral regurgitation after clip placement. Systolic flow in all pulmonary veins became systolic dominant after clip placement. MVA of the anterolateral orifice was 1.92 cm2 and the posteromedial orificed was 2.31 cm2 (Total 4.2 cm2). No significant stenosis was present. There was a small iatrogenic PFO after the procedure with L to R shunting. There was a small pericardial effusion anterior to the RV before and after the procedure without any increase in size. There no apparent complications during the procedure.  2. Left ventricular ejection fraction, by estimation, is 55 to 60%. The left ventricle has normal function. The left ventricle has no regional wall motion abnormalities.  3. Right ventricular systolic function is normal. The right ventricular size is normal.  4. Left atrial size was moderately dilated. No left atrial/left atrial appendage thrombus was detected. The LAA emptying velocity was 56 cm/s.  5. A small pericardial effusion is present. The  pericardial effusion is circumferential.  6. The mitral valve is myxomatous. Severe mitral valve regurgitation. No evidence of mitral stenosis. There is a Mitra-Clip present in the mitral position. Procedure Date: 01/20/2021.  7. The aortic valve is tricuspid. Aortic valve regurgitation is mild. Mild aortic valve sclerosis is present, with no evidence of aortic valve stenosis.  8. There is Moderate (Grade III) layered plaque involving the transverse aorta and descending aorta. FINDINGS  Left Ventricle: Left ventricular ejection fraction, by estimation, is 55 to 60%. The left ventricle has normal function. The left ventricle has no regional wall motion abnormalities. The left ventricular internal cavity size was normal in size. Right Ventricle: The right ventricular size is normal. No increase in right ventricular wall thickness. Right ventricular systolic function is normal. Left Atrium: Left atrial size was moderately dilated. No left atrial/left atrial appendage thrombus was detected. The LAA emptying velocity was 56 cm/s. Right Atrium: Right atrial size was normal in size. Pericardium: A small pericardial effusion is present. The pericardial effusion is circumferential. Mitral Valve: The mitral valve is myxomatous. Severe mitral valve regurgitation. There is a Mitra-Clip present in the mitral position. Procedure Date: 01/20/2021. No evidence of mitral valve stenosis. MV peak gradient, 7.2 mmHg. The mean mitral valve gradient is 1.5 mmHg. Tricuspid Valve: The tricuspid valve is normal in structure. Tricuspid valve regurgitation is mild . No evidence of tricuspid stenosis. Aortic Valve: The aortic valve is tricuspid. Aortic valve regurgitation is mild. Mild aortic valve sclerosis is present, with no evidence of aortic valve stenosis. Pulmonic Valve: The pulmonic valve was normal in structure. Pulmonic valve regurgitation is trivial. No evidence of pulmonic stenosis. Aorta: The aortic root and ascending aorta are  structurally normal, with no evidence of dilitation. There is moderate (Grade III)  layered plaque involving the transverse aorta and descending aorta. IAS/Shunts: The atrial septum is grossly normal. EKG: Rhythm strip during this exam demostrated atrial fibrillation.  LEFT VENTRICLE PLAX 2D LVOT diam:     2.20 cm LV SV:         42 LV SV Index:   26 LVOT Area:     3.80 cm  AORTIC VALVE LVOT Vmax:   73.20 cm/s LVOT Vmean:  51.100 cm/s LVOT VTI:    0.110 m  AORTA Ao Root diam: 3.67 cm Ao Asc diam:  3.55 cm MITRAL VALVE                 TRICUSPID VALVE MV Area VTI:  1.18 cm       TR Peak grad:   15.8 mmHg MV Peak grad: 7.2 mmHg       TR Vmax:        199.00 cm/s MV Mean grad: 1.5 mmHg MV Vmax:      1.34 m/s       SHUNTS MV Vmean:     53.0 cm/s      Systemic VTI:  0.11 m MR Peak grad:    83.2 mmHg   Systemic Diam: 2.20 cm MR Mean grad:    53.0 mmHg MR Vmax:         456.00 cm/s MR Vmean:        347.0 cm/s MR PISA:         6.28 cm MR PISA Eff ROA: 53 mm MR PISA Radius:  1.00 cm Eleonore Chiquito MD Electronically signed by Eleonore Chiquito MD Signature Date/Time: 01/20/2021/6:24:23 PM    Final    ECHOCARDIOGRAM LIMITED  Result Date: 01/23/2021    ECHOCARDIOGRAM LIMITED REPORT   Patient Name:   Teresa Hughes Date of Exam: 01/23/2021 Medical Rec #:  [id number]        Height:       66.0 in Accession #:    [id number]       Weight:       122.4 lb Date of Birth:  February 03, 1941        BSA:          1.623 m Patient Age:    10 years         BP:           137/90 mmHg Patient Gender: F                HR:           95 bpm. Exam Location:  Inpatient Procedure: Limited Echo and Color Doppler Indications:    Pericardial effusion  History:        Patient has prior history of Echocardiogram examinations, most                 recent 01/21/2021. CHF, CAD, Arrythmias:Atrial Fibrillation; Risk                 Factors:Hypertension. GERD. CKD.  Sonographer:    Clayton Lefort RDCS (AE) Referring Phys: [id number] Josue Hector  Sonographer Comments: Bandage  covering subcostal window. IMPRESSIONS  1. Left ventricular ejection fraction, by estimation, is 55 to 60%. The left ventricle has normal function. Left ventricular endocardial border not optimally defined to evaluate regional wall motion. Left ventricular diastolic function could not be evaluated.  2. The mitral valve has been repaired/replaced. There is a Mitra-Clip present in the mitral position. Trivial mitral valve regurgitation.  3. The aortic valve is  tricuspid. Aortic valve regurgitation is moderate. Mild to moderate aortic valve sclerosis/calcification is present, without any evidence of aortic stenosis.  4. No pericardial effusion noted. FINDINGS  Left Ventricle: Left ventricular ejection fraction, by estimation, is 55 to 60%. The left ventricle has normal function. Left ventricular endocardial border not optimally defined to evaluate regional wall motion. The left ventricular internal cavity size was normal in size. Left ventricular diastolic function could not be evaluated. Pericardium: There is no evidence of pericardial effusion. Mitral Valve: The mitral valve has been repaired/replaced. Mild mitral valve regurgitation. There is a Mitra-Clip present in the mitral position. Aortic Valve: The aortic valve is tricuspid. Aortic valve regurgitation is mild to moderate. Mild to moderate aortic valve sclerosis/calcification is present, without any evidence of aortic stenosis. Fransico Him MD Electronically signed by Fransico Him MD Signature Date/Time: 01/23/2021/8:04:55 PM    Final    Disposition   Pt is being discharged home today in good condition.  Follow-up Plans & Appointments     Follow-up Information     Eileen Stanford, PA-C. Go on 01/27/2021.   Specialties: Cardiology, Radiology Why: @ 1pm for follow up wtih Nell Range and an echocardiogram afterwards at [facility] information: [address] [place] [address] [phone]         Nicoletta Dress,  MD. Go on 01/31/2021.   Specialty: Internal Medicine Why: @11 :Max Sane information: [place] [address] [address] [phone]         Richardo Priest, MD .   Specialties: Cardiology, Radiology Contact information: [place] [address] [phone]                 Discharge Medications   Allergies as of 01/24/2021       Reactions   Propranolol Other (See Comments)   Bradycardia   Sulfamethoxazole Swelling   Codeine Nausea And Vomiting   Latex Itching, Rash        Medication List     STOP taking these medications    metoprolol tartrate 50 MG tablet Commonly known as: LOPRESSOR       TAKE these medications    alendronate 70 MG tablet Commonly known as: FOSAMAX Take 70 mg by mouth every Sunday. Take with a full glass of water on an empty stomach.   allopurinol 300 MG tablet Commonly known as: ZYLOPRIM Take 300 mg by mouth at bedtime.   apixaban 2.5 MG Tabs tablet Commonly known as: ELIQUIS Take 1 tablet (2.5 mg total) by mouth 2 (two) times daily.   Biotin 5 MG Caps Take 5 mg by mouth daily.   clopidogrel 75 MG tablet Commonly known as: PLAVIX Take 1 tablet (75 mg total) by mouth daily.   digoxin 0.125 MG tablet Commonly known as: LANOXIN Take 0.0625 mg by mouth daily.   furosemide 40 MG tablet Commonly known as: LASIX Take 40 mg by mouth daily.   mirtazapine 15 MG tablet Commonly known as: REMERON Take 7.5 mg by mouth at bedtime.   multivitamin with minerals Tabs tablet Take 1 tablet by mouth daily.   omega-3 acid ethyl esters 1 g capsule Commonly known as: LOVAZA Take 2 g by mouth 2 (two) times daily.   pantoprazole 40 MG tablet Commonly known as: PROTONIX Take 1 tablet (40 mg total) by mouth daily.   potassium chloride SA 20 MEQ tablet Commonly known as: KLOR-CON Take 20 mEq by mouth 2 (two) times daily.  PreserVision AREDS 2 Caps Take 1 capsule by mouth 2 (two) times daily.    rosuvastatin 20 MG tablet Commonly known as: Crestor Take 1 tablet (20 mg total) by mouth daily.   SYSTANE OP Place 1 drop into both eyes 2 (two) times daily.   Vitamin D3 25 MCG (1000 UT) Caps Take 1,000 Units by mouth daily.         Outstanding Labs/Studies   none  Duration of Discharge Encounter   Greater than 30 minutes including physician time.  Signed, Angelena Form, PA-C 01/24/2021, 1:42 PM [phone]  Patient seen, examined. Available data reviewed. Agree with findings, assessment, and plan as outlined by Nell Range, PA-C. On my exam this morning: Vitals:   01/24/21 0512 01/24/21 1129  BP: 112/67 136/73  Pulse: 62 (!) 46  Resp: 16 16  Temp: 98.4 F (36.9 C) 98.3 F (36.8 C)  SpO2: 97% 95%   Pt is alert and oriented, elderly woman in NAD HEENT: normal Neck: JVP - normal,  Lungs: CTA bilaterally CV: irregularly irregular without murmur or gallop Abd: soft, NT, Positive BS, no hepatomegaly Ext: no C/C/E, distal pulses intact and equal Skin: warm/dry no rash  Echo images are personally reviewed. Limited study done to evaluate for recurrent effusion. Trivial effusion demonstrated with stable Mitraclip position and function. Pt stable for DC on current Rx with follow-up planned this Thursday with a limited echo only to evaluate for effusion now that she is back on apixaban. Otherwise continue current Rx.     [name], M.D. 01/24/2021 2:02 PM

## 2021-01-24 NOTE — Care Management Important Message (Signed)
Important Message  Patient Details  Name: Teresa Hughes MRN: [id number] Date of Birth: 14-Sep-1940   Medicare Important Message Given:  Yes     Shelda Altes 01/24/2021, 9:55 AM

## 2021-01-25 ENCOUNTER — Telehealth: Payer: Self-pay

## 2021-01-25 NOTE — Telephone Encounter (Signed)
Patient contacted regarding discharge from [facility] on 01/24/2021.  Patient understands to follow up with provider Nell Range PA-C on 01/27/2021 at 1:00 PM with and Echo to follow at 2:05 PM at [facility] location. Patient understands discharge instructions? yes Patient understands medications and regiment? yes Patient understands to bring all medications to this visit? Yes  At time of call the pt was taking a nap.  I spoke with the pt's daughter, Butch Penny, and the pt is overall doing okay.  She has been up and about and even made her own breakfast this morning.

## 2021-01-27 ENCOUNTER — Ambulatory Visit (HOSPITAL_COMMUNITY): Payer: Medicare HMO | Attending: Internal Medicine

## 2021-01-27 ENCOUNTER — Encounter: Payer: Self-pay | Admitting: Physician Assistant

## 2021-01-27 ENCOUNTER — Ambulatory Visit (INDEPENDENT_AMBULATORY_CARE_PROVIDER_SITE_OTHER): Payer: Medicare HMO | Admitting: Physician Assistant

## 2021-01-27 ENCOUNTER — Other Ambulatory Visit: Payer: Self-pay

## 2021-01-27 ENCOUNTER — Encounter (INDEPENDENT_AMBULATORY_CARE_PROVIDER_SITE_OTHER): Payer: Medicare HMO | Admitting: Ophthalmology

## 2021-01-27 VITALS — BP 130/62 | HR 110 | Ht 66.0 in | Wt 121.0 lb

## 2021-01-27 DIAGNOSIS — I312 Hemopericardium, not elsewhere classified: Secondary | ICD-10-CM

## 2021-01-27 DIAGNOSIS — H348312 Tributary (branch) retinal vein occlusion, right eye, stable: Secondary | ICD-10-CM | POA: Diagnosis not present

## 2021-01-27 DIAGNOSIS — Z9889 Other specified postprocedural states: Secondary | ICD-10-CM

## 2021-01-27 DIAGNOSIS — Z95818 Presence of other cardiac implants and grafts: Secondary | ICD-10-CM | POA: Diagnosis not present

## 2021-01-27 DIAGNOSIS — I10 Essential (primary) hypertension: Secondary | ICD-10-CM | POA: Diagnosis not present

## 2021-01-27 DIAGNOSIS — I313 Pericardial effusion (noninflammatory): Secondary | ICD-10-CM | POA: Insufficient documentation

## 2021-01-27 DIAGNOSIS — H43813 Vitreous degeneration, bilateral: Secondary | ICD-10-CM | POA: Diagnosis not present

## 2021-01-27 DIAGNOSIS — I314 Cardiac tamponade: Secondary | ICD-10-CM | POA: Diagnosis not present

## 2021-01-27 DIAGNOSIS — I482 Chronic atrial fibrillation, unspecified: Secondary | ICD-10-CM

## 2021-01-27 DIAGNOSIS — H353112 Nonexudative age-related macular degeneration, right eye, intermediate dry stage: Secondary | ICD-10-CM | POA: Diagnosis not present

## 2021-01-27 DIAGNOSIS — I2510 Atherosclerotic heart disease of native coronary artery without angina pectoris: Secondary | ICD-10-CM

## 2021-01-27 DIAGNOSIS — H35033 Hypertensive retinopathy, bilateral: Secondary | ICD-10-CM

## 2021-01-27 DIAGNOSIS — Z9861 Coronary angioplasty status: Secondary | ICD-10-CM | POA: Diagnosis not present

## 2021-01-27 DIAGNOSIS — H353221 Exudative age-related macular degeneration, left eye, with active choroidal neovascularization: Secondary | ICD-10-CM | POA: Diagnosis not present

## 2021-01-27 LAB — ECHOCARDIOGRAM LIMITED
Area-P 1/2: 1.96 cm2
Height: 66 in
S' Lateral: 2.4 cm
Weight: 1936 oz

## 2021-01-27 MED ORDER — METOPROLOL TARTRATE 50 MG PO TABS: 50.0000 mg | ORAL_TABLET | Freq: Two times a day (BID) | ORAL | 3 refills | Status: DC

## 2021-01-27 NOTE — Progress Notes (Signed)
[facility] [place]                                     Cardiology Office Note:    Date:  01/27/2021   ID:  Teresa Hughes, DOB December 03, 1940, MRN [id number]  PCP:  Nicoletta Dress, MD  [facility] Cardiologist:  Shirlee More, MD  [facility] Electrophysiologist:  None   Referring MD: Nicoletta Dress, MD   [facility] s/p mitral valve TEER   History of Present Illness:    Teresa Hughes is a 80 y.o. female with a hx of severe multivessel CAD s/p atherectomy and PCI of mRCA (12/22/20), longstanding persistent atrial fibrillation on long-term Eliquis, embolic stroke, chronic diastolic congestive heart failure, chronic kidney disease, degenerative arthritis with chronic back pain, esophageal stricture, GE reflux disease, macular degeneration, hiatal hernia, and mitral valve prolapse with mitral regurgitation s/p TEER (01/20/21) c/b hemopericardium with tamponade s/p emergent pericardiocentesis who presents to clinic for follow up.  Patient states that she is been known of presence of a heart murmur since she was young.  She denies any previous history of coronary artery disease.  She has history of previous stroke with some residual and history of longstanding persistent atrial fibrillation currently anticoagulated using Eliquis.  She has long history of shortness of breath without chest discomfort dating back at least 5 or 6 years but which has progressed substantially over the past 6 to 12 months.  Follow-up transthoracic echocardiogram performed July 13, 2020 revealed normal left ventricular systolic function with ejection fraction estimated 60 to 65%.  There was mitral valve prolapse with moderate to severe mitral regurgitation.  Transesophageal echocardiogram was recommended.  Patient underwent TEE on September 14, 2020 which confirmed the presence of mitral valve prolapse with severe mitral regurgitation.  There was low normal left ventricular  function with ejection fraction estimated only 50 to 55% in the setting of severe mitral regurgitation.  There was severe holosystolic prolapse involving a portion of the middle scallop of the posterior leaflet.  There was severe left atrial enlargement.  The patient was referred to the multidisciplinary heart valve clinic and has been evaluated previously by Dr. Burt Knack who discussed the potential role of transcatheter edge-to-edge repair.  Diagnostic cardiac catheterization was performed October 29, 2020.  Catheterization revealed severe multivessel coronary artery disease including long segment severe stenosis of the mid right coronary artery and 75% proximal stenosis of the left anterior descending coronary artery with mild nonobstructive disease in the left circumflex territory.  Right heart pressures were very mildly elevated. She later underwent successful atherectomy and stenting of severe, calcified, complex stenoses in the mid RCA using balloon angioplasty, orbital atherectomy, and stenting on 12/22/20. She was treated with aspirin x1 week, Plavix x 6 months and resumed on Eliquis 2.5mg  BID.    She was evaluated by the multidisciplinary valve team and underwent successful edge-to-edge repair with MitraClip with good MR reduction. Post operative course was c/b hemopericardium with tamponade s/p emergent pericardiocentesis with drain placement. Minimal residual effusion seen on POD1 echo and resolved on limited echo 6/12. POD1 echo showed trivial residual MR and a mean gradient of 3 mm hg. Continued on clopidogrel after recent PCI. Resumed on home Eliquis 48 hours out from hemopericardium. Neurology consulted an CT head showed prior R MCA stroke in 2018 and prior R insular infarct but no acute  findings.Of note, BPs were soft and Lopressor 50 mg BID was held at discharge  Today she presents to clinic for follow up.  She is here with her daughter.  She is doing well with no issues.  She denies chest pain or  shortness of breath.  She does have some mild throat discomfort that is getting better.  She denies lower extremity edema, orthopnea or PND.  She denies chest pain.  Yesterday she did have an episode of weakness and shortness of breath while sitting out on the porch in very hot weather.  This resolved when going inside into the air conditioning.  Past Medical History:  Diagnosis Date   Acute kidney injury ([place])    Anemia of chronic disease    Aneurysm ([place]) 07/01/2018   Anxiety    Asthma    Cerebral thrombosis with cerebral infarction ([place]) 05/07/2012   CHF (congestive heart failure) (HCC)    Chronic anticoagulation 08/28/2016   Chronic atrial fibrillation ([place]) 08/28/2016   Chronic insomnia    Chronic renal disease    Coronary artery disease    DDD (degenerative disc disease), cervical    Dyspnea    Dysrhythmia    Esophageal stricture    GERD (gastroesophageal reflux disease)    Gout    Heart murmur    Hiatal hernia    High risk medication use 08/28/2016   Hypercalcemia    Hypertension    Hypertensive heart disease without heart failure 08/28/2016   Late effects of CVA (cerebrovascular accident)    Macular degeneration, dry    Osteopenia    Osteoporosis    Other hyperlipidemia 08/28/2016   Paroxysmal SVT (supraventricular tachycardia) (HCC)    Pneumonia    S/P mitral valve clip implantation 01/20/2021   s/p TEER mitral valve repair using a MitraClip G4 XT W device, positioned A2 P2, reducing mitral regurgitation from severe (4+) at baseline to mild (1+) post procedure   Sleep apnea    Vitamin D deficiency     Past Surgical History:  Procedure Laterality Date   CARDIAC CATHETERIZATION     CATARACT EXTRACTION Bilateral    Right 04/02/17, Left 9/22018   CORONARY ATHERECTOMY N/A 12/22/2020   Procedure: CORONARY ATHERECTOMY;  Surgeon: [name], MD;  Location: [place] [facility];  Service: Cardiovascular;  Laterality: N/A;   CORONARY STENT INTERVENTION N/A 12/22/2020    Procedure: CORONARY STENT INTERVENTION;  Surgeon: [name], MD;  Location: [place] [facility];  Service: Cardiovascular;  Laterality: N/A;   ERCP W/ SPHINCTEROTOMY AND BALLOON DILATION     EYE SURGERY     HEMORRHOID SURGERY  2016   MITRAL VALVE REPAIR N/A 01/20/2021   Procedure: MITRAL VALVE REPAIR;  Surgeon: [name], MD;  Location: [place] [facility];  Service: Cardiovascular;  Laterality: N/A;   PERICARDIOCENTESIS N/A 01/20/2021   Procedure: PERICARDIOCENTESIS;  Surgeon: Jettie Booze, MD;  Location: [place] [facility];  Service: Cardiovascular;  Laterality: N/A;   RIGHT/LEFT HEART CATH AND CORONARY ANGIOGRAPHY N/A 10/29/2020   Procedure: RIGHT/LEFT HEART CATH AND CORONARY ANGIOGRAPHY;  Surgeon: [name], MD;  Location: [place] [facility];  Service: Cardiovascular;  Laterality: N/A;   TEE WITHOUT CARDIOVERSION N/A 09/14/2020   Procedure: TRANSESOPHAGEAL ECHOCARDIOGRAM (TEE);  Surgeon: Buford Dresser, MD;  Location: [facility];  Service: Cardiovascular;  Laterality: N/A;   TEE WITHOUT CARDIOVERSION N/A 01/20/2021   Procedure: TRANSESOPHAGEAL ECHOCARDIOGRAM (TEE);  Surgeon: [name], MD;  Location: [place] [facility];  Service: Cardiovascular;  Laterality: N/A;    Current Medications: Current Meds  Medication Sig   alendronate (FOSAMAX) 70 MG tablet Take 70 mg by mouth every Sunday. Take with a full glass of water on an empty stomach.   allopurinol (ZYLOPRIM) 300 MG tablet Take 300 mg by mouth at bedtime.   apixaban (ELIQUIS) 2.5 MG TABS tablet Take 1 tablet (2.5 mg total) by mouth 2 (two) times daily.   Biotin 5 MG CAPS Take 5 mg by mouth daily.   Cholecalciferol (VITAMIN D3) 25 MCG (1000 UT) CAPS Take 1,000 Units by mouth daily.   clopidogrel (PLAVIX) 75 MG tablet Take 1 tablet (75 mg total) by mouth daily.   digoxin (LANOXIN) 0.125 MG tablet Take 0.0625 mg by mouth daily.   furosemide (LASIX) 40 MG tablet Take 40 mg by mouth daily.   mirtazapine  (REMERON) 15 MG tablet Take 7.5 mg by mouth at bedtime.   Multiple Vitamin (MULTIVITAMIN WITH MINERALS) TABS tablet Take 1 tablet by mouth daily.   Multiple Vitamins-Minerals (PRESERVISION AREDS 2) CAPS Take 1 capsule by mouth 2 (two) times daily.   omega-3 acid ethyl esters (LOVAZA) 1 g capsule Take 2 g by mouth 2 (two) times daily.   pantoprazole (PROTONIX) 40 MG tablet Take 1 tablet (40 mg total) by mouth daily.   Polyethyl Glycol-Propyl Glycol (SYSTANE OP) Place 1 drop into both eyes 2 (two) times daily.   potassium chloride SA (KLOR-CON) 20 MEQ tablet Take 20 mEq by mouth 2 (two) times daily.   rosuvastatin (CRESTOR) 20 MG tablet Take 1 tablet (20 mg total) by mouth daily.     Allergies:   Propranolol, Sulfamethoxazole, Codeine, and Latex   Social History   Socioeconomic History   Marital status: Unknown    Spouse name: Not on file   Number of children: Not on file   Years of education: Not on file   Highest education level: Not on file  Occupational History   Not on file  Tobacco Use   Smoking status: Never   Smokeless tobacco: Current    Types: Snuff  Vaping Use   Vaping Use: Never used  Substance and Sexual Activity   Alcohol use: Never   Drug use: Never   Sexual activity: Not on file  Other Topics Concern   Not on file  Social History Narrative   Not on file   Social Determinants of Health   Financial Resource Strain: Not on file  Food Insecurity: Not on file  Transportation Needs: Not on file  Physical Activity: Not on file  Stress: Not on file  Social Connections: Not on file     Family History: The patient's family history includes Aneurysm in her maternal grandmother; Congestive Heart Failure in her father; Diabetes in her mother; Heart attack in her brother, father, and sister; Heart disease in her brother, father, maternal grandfather, and maternal grandmother; Hypertension in her brother and mother; Skin cancer in her brother; Stroke in her maternal  grandfather and mother.  ROS:   Please see the history of present illness.    All other systems reviewed and are negative.  EKGs/Labs/Other Studies Reviewed:    The following studies were reviewed today:    01/20/21 Conclusion   Successful transcatheter edge-to-edge mitral valve repair using a MitraClip G4 XT W device, positioned A2 P2, reducing mitral regurgitation from severe (4+) at baseline to mild (1+) post procedure.   Recommendations   Antiplatelet/Anticoag Recommend to resume Apixaban, at currently prescribed dose and frequency on  01/20/2021. Recommend concurrent antiplatelet therapy of Clopidogrel 75mg  daily for 3 months.    _____________     Echo 01/21/21 IMPRESSIONS   1. Left ventricular ejection fraction, by estimation, is 60 to 65%. The  left ventricle has normal function. The left ventricle has no regional  wall motion abnormalities. There is severe left ventricular hypertrophy of  the basal-septal segment. Left  ventricular diastolic function could not be evaluated. Elevated left  ventricular end-diastolic pressure.   2. Right ventricular systolic function is normal. The right ventricular  size is normal. Tricuspid regurgitation signal is inadequate for assessing  PA pressure.   3. Left atrial size was severely dilated.   4. Right atrial size was mildly dilated.   5. The mitral valve has been repaired/replaced. There is a Mitra-Clip  present in the mitral position. Trivial mitral valve regurgitation. No  evidence of mitral stenosis. The mean mitral valve gradient is 3.0 mmHg.   6. The aortic valve was not well visualized. Aortic valve regurgitation  is mild. Mild aortic valve sclerosis is present, with no evidence of  aortic valve stenosis. Aortic regurgitation PHT measures 522 msec. Aortic  valve area, by VTI measures 2.49 cm.  Aortic valve mean gradient measures 3.0 mmHg. Aortic valve Vmax measures  1.19 m/s.   7. Aortic dilatation noted. There is mild  dilatation of the aortic root,  measuring 40 mm.   8. Evidence of atrial level shunting detected by color flow Doppler.  There is a small patent foramen ovale.    __________________   Echo 01/23/21 IMPRESSIONS   1. Left ventricular ejection fraction, by estimation, is 55 to 60%. The  left ventricle has normal function. Left ventricular endocardial border  not optimally defined to evaluate regional wall motion. Left ventricular  diastolic function could not be  evaluated.   2. The mitral valve has been repaired/replaced. There is a Mitra-Clip  present in the mitral position. Trivial mitral valve regurgitation.   3. The aortic valve is tricuspid. Aortic valve regurgitation is moderate.  Mild to moderate aortic valve sclerosis/calcification is present, without  any evidence of aortic stenosis.   4. No pericardial effusion noted.    ___________________  Limited echo 01/27/21 IMPRESSIONS  1. Limited study with all views not obtained and doppler incomplete. Pt in atrial fibrillation during the study; s/p mitraclip with no MS (mean gradient 4 mmHg) and trace MR; PFO noted.  2. Left ventricular ejection fraction, by estimation, is 65 to 70%. The left ventricle has normal function. There is mild left ventricular hypertrophy. Left ventricular diastolic function could not be evaluated.  3. Right ventricular systolic function is normal. The right ventricular size is normal.  4. Left atrial size was moderately dilated.  5. Trivial mitral valve regurgitation. No evidence of mitral stenosis. There is a Mitra-Clip present in the mitral position.  6. The aortic valve was not well visualized. Aortic valve regurgitation is mild.  7. Aortic dilatation noted. There is mild dilatation of the aortic root, measuring 40 mm.  8. The inferior vena cava is normal in size with greater than 50% respiratory variability, suggesting right atrial pressure of 3 mmHg.  EKG:  EKG is ordered today.  Atrial fibrillation  with RVR.  There are PVCs as well as marked ST T wave abnormalities in V4 through V6.  Heart rate is 110  Recent Labs: 01/21/2021: ALT 18; B Natriuretic Peptide 259.3 01/22/2021: Magnesium 2.2; Platelets 124 01/23/2021: BUN 21; Creatinine, Ser 0.99; Hemoglobin 9.9; Potassium 3.8; Sodium  139  Recent Lipid Panel No results found for: CHOL, TRIG, HDL, CHOLHDL, VLDL, LDLCALC, LDLDIRECT   Risk Assessment/Calculations:    CHA2DS2-VASc Score = 6  This indicates a 9.7% annual risk of stroke. The patient's score is based upon: CHF History: Yes HTN History: Yes Diabetes History: No Stroke History: No Vascular Disease History: Yes Age Score: 2 Gender Score: 1      Physical Exam:    VS:  BP 130/62   Pulse (!) 110   Ht 5\' 6"  (1.676 m)   Wt 121 lb (54.9 kg)   SpO2 90%   BMI 19.53 kg/m     Wt Readings from Last 3 Encounters:  01/27/21 121 lb (54.9 kg)  01/24/21 118 lb 3.2 oz (53.6 kg)  01/18/21 122 lb 3.2 oz (55.4 kg)     GEN:  Well nourished, well developed in no acute distress HEENT: Normal NECK: No JVD LYMPHATICS: No lymphadenopathy CARDIAC: Irregularly irregular, tachycardic.soft murmur at the apex.  no rubs, gallops RESPIRATORY:  Clear to auscultation without rales, wheezing or rhonchi  ABDOMEN: Soft, non-tender, non-distended MUSCULOSKELETAL:  No edema; No deformity  SKIN: Warm and dry.  NEUROLOGIC:  Alert and oriented x 3 PSYCHIATRIC:  Normal affect   ASSESSMENT:    1. S/P mitral valve clip implantation   2. Hemopericardium   3. CAD S/P percutaneous coronary angioplasty   4. Chronic atrial fibrillation (HCC)    PLAN:    In order of problems listed above:  Severe non-rheumatic MR s/p TEER: She is doing well 1 week out from MitraClip.  Her ECG today is abnormal with some ST-T wave abnormalities in the lateral leads.  I reviewed this with Dr. Burt Knack who did not feel like any further work-up was indicated given her lack of symptoms. Limited echo today shows normal LV  function with a normally functioning mitral clip with trace residual MR and no mitral stenosis.  Her groin site is healing well.  She will continue on Plavix and Eliquis.  SBE prophylaxis was discussed; she gets antibiotics from the dentist.     Hemopericardium with cardiac tamponade: Repeat limited echo today shows no recurrent pericardial effusion.  CAD s/p recent PCI: continue clopidogrel. No aspirin given Eliquis.  No chest pain. Her ECG today is abnormal with some ST-T wave abnormalities in the lateral leads.  I reviewed this with Dr. Burt Knack who did not feel like any further work-up was indicated given her lack of symptoms.  Additionally, limited echo today looks stable.   Persistent atrial fibrillation: rate elevated today. Lopressor 50mg  BID was held at discharge due to low pressures. BP good so will add back home Lopressor 50mg  BID.    Medication Adjustments/Labs and Tests Ordered: Current medicines are reviewed at length with the patient today.  Concerns regarding medicines are outlined above.  Orders Placed This Encounter  Procedures   EKG 12-Lead    Meds ordered this encounter  Medications   metoprolol tartrate (LOPRESSOR) 50 MG tablet    Sig: Take 1 tablet (50 mg total) by mouth 2 (two) times daily.    Dispense:  180 tablet    Refill:  3     Patient Instructions  Medication Instructions:  1) RESTART METOPROLOL 50 mg twice daily *If you need a refill on your cardiac medications before your next appointment, please call your pharmacy*   Follow-Up: Please keep your appointments as scheduled!   Signed, Angelena Form, PA-C  01/27/2021 4:28 PM    [facility]  [facility]  

## 2021-01-27 NOTE — Patient Instructions (Signed)
Medication Instructions:  1) RESTART METOPROLOL 50 mg twice daily *If you need a refill on your cardiac medications before your next appointment, please call your pharmacy*   Follow-Up: Please keep your appointments as scheduled!

## 2021-01-31 DIAGNOSIS — I340 Nonrheumatic mitral (valve) insufficiency: Secondary | ICD-10-CM | POA: Diagnosis not present

## 2021-01-31 DIAGNOSIS — I314 Cardiac tamponade: Secondary | ICD-10-CM | POA: Diagnosis not present

## 2021-01-31 DIAGNOSIS — I312 Hemopericardium, not elsewhere classified: Secondary | ICD-10-CM | POA: Diagnosis not present

## 2021-01-31 DIAGNOSIS — I2510 Atherosclerotic heart disease of native coronary artery without angina pectoris: Secondary | ICD-10-CM | POA: Diagnosis not present

## 2021-01-31 DIAGNOSIS — D62 Acute posthemorrhagic anemia: Secondary | ICD-10-CM | POA: Diagnosis not present

## 2021-01-31 DIAGNOSIS — I480 Paroxysmal atrial fibrillation: Secondary | ICD-10-CM | POA: Diagnosis not present

## 2021-01-31 DIAGNOSIS — J9600 Acute respiratory failure, unspecified whether with hypoxia or hypercapnia: Secondary | ICD-10-CM | POA: Diagnosis not present

## 2021-02-01 ENCOUNTER — Other Ambulatory Visit (HOSPITAL_COMMUNITY): Payer: Self-pay

## 2021-02-15 DIAGNOSIS — D638 Anemia in other chronic diseases classified elsewhere: Secondary | ICD-10-CM | POA: Diagnosis not present

## 2021-02-16 MED FILL — Medication: Qty: 1 | Status: AC

## 2021-02-28 DIAGNOSIS — R011 Cardiac murmur, unspecified: Secondary | ICD-10-CM | POA: Insufficient documentation

## 2021-02-28 DIAGNOSIS — G4730 Sleep apnea, unspecified: Secondary | ICD-10-CM | POA: Insufficient documentation

## 2021-02-28 DIAGNOSIS — I499 Cardiac arrhythmia, unspecified: Secondary | ICD-10-CM | POA: Insufficient documentation

## 2021-03-02 ENCOUNTER — Encounter: Payer: Self-pay | Admitting: Physician Assistant

## 2021-03-02 ENCOUNTER — Ambulatory Visit: Payer: Medicare HMO | Admitting: Physician Assistant

## 2021-03-02 ENCOUNTER — Ambulatory Visit (HOSPITAL_COMMUNITY): Payer: Medicare HMO | Attending: Cardiology

## 2021-03-02 ENCOUNTER — Other Ambulatory Visit: Payer: Self-pay

## 2021-03-02 VITALS — BP 132/60 | HR 60 | Ht 66.0 in | Wt 115.8 lb

## 2021-03-02 DIAGNOSIS — Z9889 Other specified postprocedural states: Secondary | ICD-10-CM

## 2021-03-02 DIAGNOSIS — I312 Hemopericardium, not elsewhere classified: Secondary | ICD-10-CM | POA: Diagnosis not present

## 2021-03-02 DIAGNOSIS — Z9861 Coronary angioplasty status: Secondary | ICD-10-CM | POA: Diagnosis not present

## 2021-03-02 DIAGNOSIS — I340 Nonrheumatic mitral (valve) insufficiency: Secondary | ICD-10-CM | POA: Insufficient documentation

## 2021-03-02 DIAGNOSIS — I482 Chronic atrial fibrillation, unspecified: Secondary | ICD-10-CM

## 2021-03-02 DIAGNOSIS — Z95818 Presence of other cardiac implants and grafts: Secondary | ICD-10-CM | POA: Diagnosis not present

## 2021-03-02 DIAGNOSIS — R630 Anorexia: Secondary | ICD-10-CM

## 2021-03-02 DIAGNOSIS — I2510 Atherosclerotic heart disease of native coronary artery without angina pectoris: Secondary | ICD-10-CM | POA: Diagnosis not present

## 2021-03-02 LAB — ECHOCARDIOGRAM COMPLETE
Area-P 1/2: 2.33 cm2
MV VTI: 1.42 cm2
P 1/2 time: 386 msec
S' Lateral: 3.4 cm

## 2021-03-02 NOTE — Progress Notes (Signed)
[facility] [place]                                     Cardiology Office Note:    Date:  03/04/2021   ID:  ALYSON KI, DOB 03/31/1941, MRN [id number]  PCP:  Nicoletta Dress, MD  [facility] Cardiologist:  Shirlee More, MD  [facility] Electrophysiologist:  None   Referring MD: Nicoletta Dress, MD   1 month s/p mitral valve TEER   History of Present Illness:    Teresa Hughes is a 80 y.o. female with a hx of severe multivessel CAD s/p atherectomy and PCI of mRCA (12/22/20), longstanding persistent atrial fibrillation on long-term Eliquis, embolic stroke, chronic diastolic congestive heart failure, chronic kidney disease, degenerative arthritis with chronic back pain, esophageal stricture, GE reflux disease, macular degeneration, hiatal hernia, and mitral valve prolapse with mitral regurgitation s/p TEER (01/20/21) c/b hemopericardium with tamponade s/p emergent pericardiocentesis who presents to clinic for follow up.  Patient states that she is been known of presence of a heart murmur since she was young.  She denies any previous history of coronary artery disease.  She has history of previous stroke with some residual and history of longstanding persistent atrial fibrillation currently anticoagulated using Eliquis.  She has long history of shortness of breath without chest discomfort dating back at least 5 or 6 years but which has progressed substantially over the past 6 to 12 months.  Follow-up transthoracic echocardiogram performed July 13, 2020 revealed normal left ventricular systolic function with ejection fraction estimated 60 to 65%.  There was mitral valve prolapse with moderate to severe mitral regurgitation.  Transesophageal echocardiogram was recommended.  Patient underwent TEE on September 14, 2020 which confirmed the presence of mitral valve prolapse with severe mitral regurgitation.  There was low normal left  ventricular function with ejection fraction estimated only 50 to 55% in the setting of severe mitral regurgitation.  There was severe holosystolic prolapse involving a portion of the middle scallop of the posterior leaflet.  There was severe left atrial enlargement.  The patient was referred to the multidisciplinary heart valve clinic and has been evaluated previously by Dr. Burt Knack who discussed the potential role of transcatheter edge-to-edge repair.  Diagnostic cardiac catheterization was performed October 29, 2020.  Catheterization revealed severe multivessel coronary artery disease including long segment severe stenosis of the mid right coronary artery and 75% proximal stenosis of the left anterior descending coronary artery with mild nonobstructive disease in the left circumflex territory.  Right heart pressures were very mildly elevated. She later underwent successful atherectomy and stenting of severe, calcified, complex stenoses in the mid RCA using balloon angioplasty, orbital atherectomy, and stenting on 12/22/20. She was treated with aspirin x1 week, Plavix x 6 months and resumed on Eliquis 2.5mg  BID.    She was evaluated by the multidisciplinary valve team and underwent successful edge-to-edge repair with MitraClip with good MR reduction. Post operative course was c/b hemopericardium with tamponade s/p emergent pericardiocentesis with drain placement. Minimal residual effusion seen on POD1 echo and resolved on limited echo 6/12. POD1 echo showed trivial residual MR and a mean gradient of 3 mm hg. Continued on clopidogrel after recent PCI. Resumed on home Eliquis 48 hours out from hemopericardium. Neurology consulted an CT head showed prior R MCA stroke in 2018 and prior R insular infarct but no  acute findings.Of note, BPs were soft and Lopressor 50 mg BID was held at discharge  At 1 week follow up her ECG was abnormal showing some ST-T wave abnormalities in the lateral leads.  I reviewed this with Dr.  Burt Knack who did not feel like any further work-up was indicated given her lack of symptoms and stable limited echo that day. Additionally, her HR was elevated and lopressor was added back.   Today she presents to clinic for follow up.  Here alone. No complaints ither than she still do get shortness of breathing with moderate activity. She hasn't been able to see an appreciable difference in her symptoms since clip. No chest pain. No LE edema, orthopnea or PND. SHe does have fatigue which slightly limits her ability to do her normal ADLs.  Past Medical History:  Diagnosis Date   Acute kidney injury ([place])    Anemia of chronic disease    Aneurysm ([place]) 07/01/2018   Anxiety    Asthma    Cerebral thrombosis with cerebral infarction ([place]) 05/07/2012   CHF (congestive heart failure) (HCC)    Chronic anticoagulation 08/28/2016   Chronic atrial fibrillation ([place]) 08/28/2016   Chronic insomnia    Chronic renal disease    Coronary artery disease    DDD (degenerative disc disease), cervical    Dyspnea    Dysrhythmia    Esophageal stricture    GERD (gastroesophageal reflux disease)    Gout    Heart murmur    Hiatal hernia    High risk medication use 08/28/2016   Hypercalcemia    Hypertension    Hypertensive heart disease without heart failure 08/28/2016   Late effects of CVA (cerebrovascular accident)    Macular degeneration, dry    Osteopenia    Osteoporosis    Other hyperlipidemia 08/28/2016   Paroxysmal SVT (supraventricular tachycardia) (HCC)    Pneumonia    S/P mitral valve clip implantation 01/20/2021   s/p TEER mitral valve repair using a MitraClip G4 XT W device, positioned A2 P2, reducing mitral regurgitation from severe (4+) at baseline to mild (1+) post procedure   Sleep apnea    Vitamin D deficiency     Past Surgical History:  Procedure Laterality Date   CARDIAC CATHETERIZATION     CATARACT EXTRACTION Bilateral    Right 04/02/17, Left 9/22018   CORONARY ATHERECTOMY N/A  12/22/2020   Procedure: CORONARY ATHERECTOMY;  Surgeon: [name], MD;  Location: [place] [facility];  Service: Cardiovascular;  Laterality: N/A;   CORONARY STENT INTERVENTION N/A 12/22/2020   Procedure: CORONARY STENT INTERVENTION;  Surgeon: [name], MD;  Location: [place] [facility];  Service: Cardiovascular;  Laterality: N/A;   ERCP W/ SPHINCTEROTOMY AND BALLOON DILATION     EYE SURGERY     HEMORRHOID SURGERY  2016   MITRAL VALVE REPAIR N/A 01/20/2021   Procedure: MITRAL VALVE REPAIR;  Surgeon: [name], MD;  Location: [place] [facility];  Service: Cardiovascular;  Laterality: N/A;   PERICARDIOCENTESIS N/A 01/20/2021   Procedure: PERICARDIOCENTESIS;  Surgeon: Jettie Booze, MD;  Location: [place] [facility];  Service: Cardiovascular;  Laterality: N/A;   RIGHT/LEFT HEART CATH AND CORONARY ANGIOGRAPHY N/A 10/29/2020   Procedure: RIGHT/LEFT HEART CATH AND CORONARY ANGIOGRAPHY;  Surgeon: [name], MD;  Location: [place] [facility];  Service: Cardiovascular;  Laterality: N/A;   TEE WITHOUT CARDIOVERSION N/A 09/14/2020   Procedure: TRANSESOPHAGEAL ECHOCARDIOGRAM (TEE);  Surgeon: Buford Dresser, MD;  Location: [place];  Service: Cardiovascular;  Laterality: N/A;   TEE WITHOUT CARDIOVERSION N/A 01/20/2021   Procedure: TRANSESOPHAGEAL ECHOCARDIOGRAM (TEE);  Surgeon: [name], MD;  Location: [place] [facility];  Service: Cardiovascular;  Laterality: N/A;    Current Medications: Current Meds  Medication Sig   alendronate (FOSAMAX) 70 MG tablet Take 70 mg by mouth every Sunday. Take with a full glass of water on an empty stomach.   allopurinol (ZYLOPRIM) 300 MG tablet Take 300 mg by mouth at bedtime.   apixaban (ELIQUIS) 2.5 MG TABS tablet Take 1 tablet (2.5 mg total) by mouth 2 (two) times daily.   Biotin 5 MG CAPS Take 5 mg by mouth daily.   Cholecalciferol (VITAMIN D3) 25 MCG (1000 UT) CAPS Take 1,000 Units by mouth daily.   clopidogrel (PLAVIX) 75 MG  tablet Take 1 tablet (75 mg total) by mouth daily.   digoxin (LANOXIN) 0.125 MG tablet Take 0.0625 mg by mouth daily.   furosemide (LASIX) 40 MG tablet Take 40 mg by mouth daily.   metoprolol tartrate (LOPRESSOR) 50 MG tablet Take 1 tablet (50 mg total) by mouth 2 (two) times daily.   mirtazapine (REMERON) 15 MG tablet Take 7.5 mg by mouth at bedtime.   Multiple Vitamin (MULTIVITAMIN WITH MINERALS) TABS tablet Take 1 tablet by mouth daily.   Multiple Vitamins-Minerals (PRESERVISION AREDS 2) CAPS Take 1 capsule by mouth 2 (two) times daily.   omega-3 acid ethyl esters (LOVAZA) 1 g capsule Take 2 g by mouth 2 (two) times daily.   pantoprazole (PROTONIX) 40 MG tablet Take 1 tablet (40 mg total) by mouth daily.   Polyethyl Glycol-Propyl Glycol (SYSTANE OP) Place 1 drop into both eyes 2 (two) times daily.   potassium chloride SA (KLOR-CON) 20 MEQ tablet Take 20 mEq by mouth 2 (two) times daily.   rosuvastatin (CRESTOR) 20 MG tablet Take 1 tablet (20 mg total) by mouth daily.     Allergies:   Propranolol, Sulfamethoxazole, Codeine, and Latex   Social History   Socioeconomic History   Marital status: Unknown    Spouse name: Not on file   Number of children: Not on file   Years of education: Not on file   Highest education level: Not on file  Occupational History   Not on file  Tobacco Use   Smoking status: Never   Smokeless tobacco: Current    Types: Snuff  Vaping Use   Vaping Use: Never used  Substance and Sexual Activity   Alcohol use: Never   Drug use: Never   Sexual activity: Not on file  Other Topics Concern   Not on file  Social History Narrative   Not on file   Social Determinants of Health   Financial Resource Strain: Not on file  Food Insecurity: Not on file  Transportation Needs: Not on file  Physical Activity: Not on file  Stress: Not on file  Social Connections: Not on file     Family History: The patient's family history includes Aneurysm in her maternal  grandmother; Congestive Heart Failure in her father; Diabetes in her mother; Heart attack in her brother, father, and sister; Heart disease in her brother, father, maternal grandfather, and maternal grandmother; Hypertension in her brother and mother; Skin cancer in her brother; Stroke in her maternal grandfather and mother.  ROS:   Please see the history of present illness.    All other systems reviewed and are negative.  EKGs/Labs/Other Studies Reviewed:    The following studies were reviewed today:  01/20/21 Conclusion   Successful transcatheter edge-to-edge mitral valve repair using a MitraClip G4 XT W device, positioned A2 P2, reducing mitral regurgitation from severe (4+) at baseline to mild (1+) post procedure.   Recommendations   Antiplatelet/Anticoag Recommend to resume Apixaban, at currently prescribed dose and frequency on 01/20/2021. Recommend concurrent antiplatelet therapy of Clopidogrel 75mg  daily for 3 months.    _____________     Echo 01/21/21 IMPRESSIONS   1. Left ventricular ejection fraction, by estimation, is 60 to 65%. The  left ventricle has normal function. The left ventricle has no regional  wall motion abnormalities. There is severe left ventricular hypertrophy of  the basal-septal segment. Left  ventricular diastolic function could not be evaluated. Elevated left  ventricular end-diastolic pressure.   2. Right ventricular systolic function is normal. The right ventricular  size is normal. Tricuspid regurgitation signal is inadequate for assessing  PA pressure.   3. Left atrial size was severely dilated.   4. Right atrial size was mildly dilated.   5. The mitral valve has been repaired/replaced. There is a Mitra-Clip  present in the mitral position. Trivial mitral valve regurgitation. No  evidence of mitral stenosis. The mean mitral valve gradient is 3.0 mmHg.   6. The aortic valve was not well visualized. Aortic valve regurgitation  is mild. Mild aortic  valve sclerosis is present, with no evidence of  aortic valve stenosis. Aortic regurgitation PHT measures 522 msec. Aortic  valve area, by VTI measures 2.49 cm.  Aortic valve mean gradient measures 3.0 mmHg. Aortic valve Vmax measures  1.19 m/s.   7. Aortic dilatation noted. There is mild dilatation of the aortic root,  measuring 40 mm.   8. Evidence of atrial level shunting detected by color flow Doppler.  There is a small patent foramen ovale.    __________________   Echo 01/23/21 IMPRESSIONS   1. Left ventricular ejection fraction, by estimation, is 55 to 60%. The  left ventricle has normal function. Left ventricular endocardial border  not optimally defined to evaluate regional wall motion. Left ventricular  diastolic function could not be  evaluated.   2. The mitral valve has been repaired/replaced. There is a Mitra-Clip  present in the mitral position. Trivial mitral valve regurgitation.   3. The aortic valve is tricuspid. Aortic valve regurgitation is moderate.  Mild to moderate aortic valve sclerosis/calcification is present, without  any evidence of aortic stenosis.   4. No pericardial effusion noted.    ___________________  Limited echo 01/27/21 IMPRESSIONS  1. Limited study with all views not obtained and doppler incomplete. Pt in atrial fibrillation during the study; s/p mitraclip with no MS (mean gradient 4 mmHg) and trace MR; PFO noted.  2. Left ventricular ejection fraction, by estimation, is 65 to 70%. The left ventricle has normal function. There is mild left ventricular hypertrophy. Left ventricular diastolic function could not be evaluated.  3. Right ventricular systolic function is normal. The right ventricular size is normal.  4. Left atrial size was moderately dilated.  5. Trivial mitral valve regurgitation. No evidence of mitral stenosis. There is a Mitra-Clip present in the mitral position.  6. The aortic valve was not well visualized. Aortic valve  regurgitation is mild.  7. Aortic dilatation noted. There is mild dilatation of the aortic root, measuring 40 mm.  8. The inferior vena cava is normal in size with greater than 50% respiratory variability, suggesting right atrial pressure of 3 mmHg.  ____________________  Echo 03/02/21 IMPRESSIONS   1.  Left ventricular ejection fraction, by estimation, is 50 to 55%. The  left ventricle has low normal function. The left ventricle has no regional  wall motion abnormalities. There is moderate asymmetric left ventricular  hypertrophy of the basal-septal segment. Left ventricular diastolic function could not be evaluated.   2. Right ventricular systolic function is normal. The right ventricular  size is normal. There is normal pulmonary artery systolic pressure.   3. Left atrial size was moderately dilated.   4. Right atrial size was mildly dilated.   5. The mitral valve has been repaired/replaced. Trivial mitral valve  regurgitation. The mean mitral valve gradient is 2.0 mmHg. There is a  Mitra-Clip present in the mitral position. Procedure Date: 01/17/2021.   6. The aortic valve is tricuspid. There is mild calcification of the  aortic valve. There is mild thickening of the aortic valve. Aortic valve  regurgitation is mild. Mild aortic valve sclerosis is present, with no  evidence of aortic valve stenosis.  Aortic regurgitation PHT measures 386 msec.   7. Aortic dilatation noted. There is borderline dilatation of the  ascending aorta, measuring 37 mm.   8. The inferior vena cava is normal in size with greater than 50%  respiratory variability, suggesting right atrial pressure of 3 mmHg.   9. Evidence of atrial level shunting detected by color flow Doppler.  There is a small patent foramen ovale with predominantly left to right  shunting across the atrial septum.   Comparison(s): Prior images reviewed side by side. Changes from prior  study are noted. EF slightly reduced/frequent PVCs.    Conclusion(s)/Recommendation(s): Frequent and occasionally sequential PVCs noted throughout study, which impacts EF and wall motion evaluation.   EKG:  EKG is NOT ordered today.    Recent Labs: 01/21/2021: ALT 18; B Natriuretic Peptide 259.3 01/22/2021: Magnesium 2.2; Platelets 124 01/23/2021: BUN 21; Creatinine, Ser 0.99; Hemoglobin 9.9; Potassium 3.8; Sodium 139  Recent Lipid Panel No results found for: CHOL, TRIG, HDL, CHOLHDL, VLDL, LDLCALC, LDLDIRECT   Risk Assessment/Calculations:    CHA2DS2-VASc Score = 6  This indicates a 9.7% annual risk of stroke. The patient's score is based upon: CHF History: Yes HTN History: Yes Diabetes History: No Stroke History: No Vascular Disease History: Yes Age Score: 2 Gender Score: 1      Physical Exam:    VS:  BP 132/60   Pulse 60   Ht 5\' 6"  (1.676 m)   Wt 115 lb 12.8 oz (52.5 kg)   SpO2 100%   BMI 18.69 kg/m     Wt Readings from Last 3 Encounters:  03/02/21 115 lb 12.8 oz (52.5 kg)  01/27/21 121 lb (54.9 kg)  01/24/21 118 lb 3.2 oz (53.6 kg)     GEN:  Well nourished, well developed in no acute distress HEENT: Normal NECK: No JVD LYMPHATICS: No lymphadenopathy CARDIAC: Irregularly irregular. soft murmur at the apex.  no rubs, gallops RESPIRATORY:  Clear to auscultation without rales, wheezing or rhonchi  ABDOMEN: Soft, non-tender, non-distended MUSCULOSKELETAL:  No edema; No deformity  SKIN: Warm and dry.  NEUROLOGIC:  Alert and oriented x 3 PSYCHIATRIC:  Normal affect   ASSESSMENT:    1. S/P mitral valve clip implantation   2. Hemopericardium   3. CAD S/P percutaneous coronary angioplasty   4. Chronic atrial fibrillation (HCC)     PLAN:    In order of problems listed above:  Severe non-rheumatic MR s/p TEER: echo today shows EF 50%, normally functioning mitral valve TEER with  trivial residual MR and a mean gradient of 2 mm hg. She has NYHA class II symptoms of dyspnea and fatigue. SBE prophylaxis was  discussed; she gets antibiotics from the dentist. Continue on aspirin and plavix. She can discontinued plavix after 3 months (06/24/21). I will see her back in 1 year with an echo.    Hemopericardium with cardiac tamponade: resolved. No effusion on echo today  CAD s/p recent PCI: continue clopidogrel. No aspirin given Eliquis. She can stop plavix after 6 months (06/24/21).    Persistent atrial fibrillation: rate well controlled on Lopressor. Continue on Eliquis 2.5mg  BID.   Anorexia: just not hungry. Feels like this could be contributing to fatigue and low energy. Wonders if she can increase mitazapine to a whole pill which I think would be fine.   Medication Adjustments/Labs and Tests Ordered: Current medicines are reviewed at length with the patient today.  Concerns regarding medicines are outlined above.  No orders of the defined types were placed in this encounter.   No orders of the defined types were placed in this encounter.    Patient Instructions  Medication Instructions:  Your physician has recommended you make the following change in your medication:  1-STOP taking Plavix on November 11th  *If you need a refill on your cardiac medications before your next appointment, please call your pharmacy*  Lab Work: If you have labs (blood work) drawn today and your tests are completely normal, you will receive your results only by: [place] (if you have MyChart) OR A paper copy in the mail If you have any lab test that is abnormal or we need to change your treatment, we will call you to review the results.  Testing/Procedures: None ordered today.  Follow-Up: At [facility], you and your health needs are our priority.  As part of our continuing mission to provide you with exceptional heart care, we have created designated Provider Care Teams.  These Care Teams include your primary Cardiologist (physician) and Advanced Practice Providers (APPs -  Physician Assistants and  Nurse Practitioners) who all work together to provide you with the care you need, when you need it.  We recommend signing up for the patient portal called "MyChart".  Sign up information is provided on this After Visit Summary.  MyChart is used to connect with patients for Virtual Visits (Telemedicine).  Patients are able to view lab/test results, encounter notes, upcoming appointments, etc.  Non-urgent messages can be sent to your provider as well.   To learn more about what you can do with MyChart, go to [email or web address].    Your next appointment:   Is already scheduled with Dr. Bettina Gavia    Signed, Angelena Form, PA-C  03/04/2021 2:13 PM    [place]

## 2021-03-02 NOTE — Patient Instructions (Addendum)
Medication Instructions:  Your physician has recommended you make the following change in your medication:  1-STOP taking Plavix on November 11th  *If you need a refill on your cardiac medications before your next appointment, please call your pharmacy*  Lab Work: If you have labs (blood work) drawn today and your tests are completely normal, you will receive your results only by: [place] (if you have MyChart) OR A paper copy in the mail If you have any lab test that is abnormal or we need to change your treatment, we will call you to review the results.  Testing/Procedures: None ordered today.  Follow-Up: At [facility], you and your health needs are our priority.  As part of our continuing mission to provide you with exceptional heart care, we have created designated Provider Care Teams.  These Care Teams include your primary Cardiologist (physician) and Advanced Practice Providers (APPs -  Physician Assistants and Nurse Practitioners) who all work together to provide you with the care you need, when you need it.  We recommend signing up for the patient portal called "MyChart".  Sign up information is provided on this After Visit Summary.  MyChart is used to connect with patients for Virtual Visits (Telemedicine).  Patients are able to view lab/test results, encounter notes, upcoming appointments, etc.  Non-urgent messages can be sent to your provider as well.   To learn more about what you can do with MyChart, go to [email or web address].    Your next appointment:   Is already scheduled with Dr. Bettina Gavia

## 2021-03-11 DIAGNOSIS — N183 Chronic kidney disease, stage 3 unspecified: Secondary | ICD-10-CM | POA: Diagnosis not present

## 2021-03-11 DIAGNOSIS — G8194 Hemiplegia, unspecified affecting left nondominant side: Secondary | ICD-10-CM | POA: Diagnosis not present

## 2021-03-11 DIAGNOSIS — M109 Gout, unspecified: Secondary | ICD-10-CM | POA: Diagnosis not present

## 2021-03-11 DIAGNOSIS — D638 Anemia in other chronic diseases classified elsewhere: Secondary | ICD-10-CM | POA: Diagnosis not present

## 2021-03-11 DIAGNOSIS — Z79899 Other long term (current) drug therapy: Secondary | ICD-10-CM | POA: Diagnosis not present

## 2021-03-11 DIAGNOSIS — I10 Essential (primary) hypertension: Secondary | ICD-10-CM | POA: Diagnosis not present

## 2021-03-11 DIAGNOSIS — I480 Paroxysmal atrial fibrillation: Secondary | ICD-10-CM | POA: Diagnosis not present

## 2021-03-11 DIAGNOSIS — R7301 Impaired fasting glucose: Secondary | ICD-10-CM | POA: Diagnosis not present

## 2021-03-11 DIAGNOSIS — I5032 Chronic diastolic (congestive) heart failure: Secondary | ICD-10-CM | POA: Diagnosis not present

## 2021-03-11 DIAGNOSIS — E785 Hyperlipidemia, unspecified: Secondary | ICD-10-CM | POA: Diagnosis not present

## 2021-03-14 ENCOUNTER — Other Ambulatory Visit: Payer: Self-pay

## 2021-03-14 ENCOUNTER — Ambulatory Visit: Payer: Medicare HMO | Admitting: Cardiology

## 2021-03-14 ENCOUNTER — Encounter: Payer: Self-pay | Admitting: Cardiology

## 2021-03-14 VITALS — BP 132/62 | HR 55 | Ht 60.0 in | Wt 120.4 lb

## 2021-03-14 DIAGNOSIS — I312 Hemopericardium, not elsewhere classified: Secondary | ICD-10-CM

## 2021-03-14 DIAGNOSIS — R0602 Shortness of breath: Secondary | ICD-10-CM

## 2021-03-14 DIAGNOSIS — Z9861 Coronary angioplasty status: Secondary | ICD-10-CM | POA: Diagnosis not present

## 2021-03-14 DIAGNOSIS — Z9889 Other specified postprocedural states: Secondary | ICD-10-CM

## 2021-03-14 DIAGNOSIS — I482 Chronic atrial fibrillation, unspecified: Secondary | ICD-10-CM | POA: Diagnosis not present

## 2021-03-14 DIAGNOSIS — E7849 Other hyperlipidemia: Secondary | ICD-10-CM

## 2021-03-14 DIAGNOSIS — Z79899 Other long term (current) drug therapy: Secondary | ICD-10-CM | POA: Diagnosis not present

## 2021-03-14 DIAGNOSIS — I2510 Atherosclerotic heart disease of native coronary artery without angina pectoris: Secondary | ICD-10-CM

## 2021-03-14 DIAGNOSIS — Z95818 Presence of other cardiac implants and grafts: Secondary | ICD-10-CM | POA: Diagnosis not present

## 2021-03-14 DIAGNOSIS — I119 Hypertensive heart disease without heart failure: Secondary | ICD-10-CM

## 2021-03-14 NOTE — Progress Notes (Signed)
Cardiology Office Note:    Date:  03/14/2021   ID:  Teresa Hughes, DOB [birth year], MRN [id number]  PCP:  Nicoletta Dress, MD  Cardiologist:  Shirlee More, MD    Referring MD: Nicoletta Dress, MD   1 ASSESSMENT:    1. S/P mitral valve clip implantation   2. Hemopericardium   3. CAD S/P percutaneous coronary angioplasty   4. Chronic atrial fibrillation (HCC)   5. Hypertensive heart disease without heart failure   6. High risk medication use   7. Other hyperlipidemia   8. Shortness of breath    PLAN:    In order of problems listed above:  In general she is making a slow steady recovery from complex procedures including PCI and stent and combined antiplatelet therapy hemopericardium with anemia afterwards and mitral valve implantation.  Her heart failure seems to be well compensated I will recheck a CBC with power along with digoxin level to exclude toxicity proBNP and renal function.  Her atrial fibrillation is rate controlled continue digoxin and beta-blocker on a statin for CAD continue rosuvastatin encouraged her to weigh daily and record and will follow the office in 3 months.   Next appointment: 3 months   Medication Adjustments/Labs and Tests Ordered: Current medicines are reviewed at length with the patient today.  Concerns regarding medicines are outlined above.  Orders Placed This Encounter  Procedures   CBC   Basic metabolic panel   Pro b natriuretic peptide (BNP)   Digoxin level    No orders of the defined types were placed in this encounter.   Chief Complaint  Patient presents with   Follow-up    After Corky Sing and PCI    History of Present Illness:    Teresa Hughes is a 80 y.o. female with a hx of severe mitral valve prolapse with P2 flail leaflet hypertensive heart disease with heart failure chronic atrial fibrillation chronic anticoagulation on digoxin therapy last seen by me 09/30/2020. Compliance with diet, lifestyle and medications:  Yes  Her care since has been predominantly for structural heart coronary angiography showed multivessel CAD and she had PCI stenting and atherectomy of the mid right coronary artery Subsequently underwent successful edge-to-edge repair with MitraClip 01/20/2021 with good MR reduction postoperative course complicated by hemopericardium with tamponade requiring pericardiocentesis and drain placement. Follow-up echocardiogram shows EF 50% residual trivial mitral regurgitation and a mean valvular gradient mitral 2 mmHg with class II symptoms of heart failure on aspirin and Plavix.  Plan is to discontinue Plavix for 6 months 06/24/2021.  She is returned home she is slowly improving she is still weak and intermittently finds her self short of breath with activities and other days she is not.  She has no edema consistently every day no orthopnea chest pain palpitation or syncope and she is slowly and steadily improving.  She looks quite pale in my office.  Having no GI symptoms to suggest digoxin toxicity Past Medical History:  Diagnosis Date   Acute kidney injury ([place])    Anemia of chronic disease    Aneurysm ([place]) 07/01/2018   Anxiety    Asthma    Cerebral thrombosis with cerebral infarction ([place]) 05/07/2012   CHF (congestive heart failure) (HCC)    Chronic anticoagulation 08/28/2016   Chronic atrial fibrillation (HCC) 08/28/2016   Chronic insomnia    Chronic renal disease    Coronary artery disease    DDD (degenerative disc disease), cervical    Dyspnea  Dysrhythmia    Esophageal stricture    GERD (gastroesophageal reflux disease)    Gout    Heart murmur    Hiatal hernia    High risk medication use 08/28/2016   Hypercalcemia    Hypertension    Hypertensive heart disease without heart failure 08/28/2016   Late effects of CVA (cerebrovascular accident)    Macular degeneration, dry    Osteopenia    Osteoporosis    Other hyperlipidemia 08/28/2016   Paroxysmal SVT (supraventricular  tachycardia) (HCC)    Pneumonia    S/P mitral valve clip implantation 01/20/2021   s/p TEER mitral valve repair using a MitraClip G4 XT W device, positioned A2 P2, reducing mitral regurgitation from severe (4+) at baseline to mild (1+) post procedure   Sleep apnea    Vitamin D deficiency     Past Surgical History:  Procedure Laterality Date   CARDIAC CATHETERIZATION     CATARACT EXTRACTION Bilateral    Right 04/02/17, Left 9/22018   CORONARY ATHERECTOMY N/A 12/22/2020   Procedure: CORONARY ATHERECTOMY;  Surgeon: [name], MD;  Location: [facility];  Service: Cardiovascular;  Laterality: N/A;   CORONARY STENT INTERVENTION N/A 12/22/2020   Procedure: CORONARY STENT INTERVENTION;  Surgeon: [name], MD;  Location: [facility];  Service: Cardiovascular;  Laterality: N/A;   ERCP W/ SPHINCTEROTOMY AND BALLOON DILATION     EYE SURGERY     HEMORRHOID SURGERY  2016   MITRAL VALVE REPAIR N/A 01/20/2021   Procedure: MITRAL VALVE REPAIR;  Surgeon: [name], MD;  Location: [facility];  Service: Cardiovascular;  Laterality: N/A;   PERICARDIOCENTESIS N/A 01/20/2021   Procedure: PERICARDIOCENTESIS;  Surgeon: Jettie Booze, MD;  Location: [facility];  Service: Cardiovascular;  Laterality: N/A;   RIGHT/LEFT HEART CATH AND CORONARY ANGIOGRAPHY N/A 10/29/2020   Procedure: RIGHT/LEFT HEART CATH AND CORONARY ANGIOGRAPHY;  Surgeon: [name], MD;  Location: [facility];  Service: Cardiovascular;  Laterality: N/A;   TEE WITHOUT CARDIOVERSION N/A 09/14/2020   Procedure: TRANSESOPHAGEAL ECHOCARDIOGRAM (TEE);  Surgeon: Buford Dresser, MD;  Location: [facility];  Service: Cardiovascular;  Laterality: N/A;   TEE WITHOUT CARDIOVERSION N/A 01/20/2021   Procedure: TRANSESOPHAGEAL ECHOCARDIOGRAM (TEE);  Surgeon: [name], MD;  Location: [facility];  Service: Cardiovascular;  Laterality: N/A;    Current Medications: Current Meds   Medication Sig   alendronate (FOSAMAX) 70 MG tablet Take 70 mg by mouth every Sunday. Take with a full glass of water on an empty stomach.   allopurinol (ZYLOPRIM) 300 MG tablet Take 300 mg by mouth at bedtime.   apixaban (ELIQUIS) 2.5 MG TABS tablet Take 1 tablet (2.5 mg total) by mouth 2 (two) times daily.   Biotin 5 MG CAPS Take 5 mg by mouth daily.   Cholecalciferol (VITAMIN D3) 25 MCG (1000 UT) CAPS Take 1,000 Units by mouth daily.   clopidogrel (PLAVIX) 75 MG tablet Take 1 tablet (75 mg total) by mouth daily.   digoxin (LANOXIN) 0.125 MG tablet Take 0.0625 mg by mouth daily.   furosemide (LASIX) 40 MG tablet Take 40 mg by mouth daily.   metoprolol tartrate (LOPRESSOR) 50 MG tablet Take 1 tablet (50 mg total) by mouth 2 (two) times daily.   mirtazapine (REMERON) 15 MG tablet Take 7.5 mg by mouth at bedtime.   Multiple Vitamin (MULTIVITAMIN WITH MINERALS) TABS tablet Take 1 tablet by mouth daily. Unknown strenght   Multiple Vitamins-Minerals (PRESERVISION AREDS 2) CAPS  Take 1 capsule by mouth 2 (two) times daily. Unknown strenght   omega-3 acid ethyl esters (LOVAZA) 1 g capsule Take 2 g by mouth 2 (two) times daily.   pantoprazole (PROTONIX) 40 MG tablet Take 1 tablet (40 mg total) by mouth daily.   Polyethyl Glycol-Propyl Glycol (SYSTANE OP) Place 1 drop into both eyes 2 (two) times daily.   potassium chloride SA (KLOR-CON) 20 MEQ tablet Take 20 mEq by mouth 2 (two) times daily.   rosuvastatin (CRESTOR) 20 MG tablet Take 1 tablet (20 mg total) by mouth daily.     Allergies:   Propranolol, Sulfamethoxazole, Codeine, and Latex   Social History   Socioeconomic History   Marital status: Unknown    Spouse name: Not on file   Number of children: Not on file   Years of education: Not on file   Highest education level: Not on file  Occupational History   Not on file  Tobacco Use   Smoking status: Never   Smokeless tobacco: Current    Types: Snuff  Vaping Use   Vaping Use: Never  used  Substance and Sexual Activity   Alcohol use: Never   Drug use: Never   Sexual activity: Not on file  Other Topics Concern   Not on file  Social History Narrative   Not on file   Social Determinants of Health   Financial Resource Strain: Not on file  Food Insecurity: Not on file  Transportation Needs: Not on file  Physical Activity: Not on file  Stress: Not on file  Social Connections: Not on file     Family History: The patient's family history includes Aneurysm in her maternal grandmother; Congestive Heart Failure in her father; Diabetes in her mother; Heart attack in her brother, father, and sister; Heart disease in her brother, father, maternal grandfather, and maternal grandmother; Hypertension in her brother and mother; Skin cancer in her brother; Stroke in her maternal grandfather and mother. ROS:   Please see the history of present illness.    All other systems reviewed and are negative.  EKGs/Labs/Other Studies Reviewed:    The following studies were reviewed today:  EKG:  EKG 01/27/2021 independently reviewed A. fib rapid rate 120 bpm PVCs are present and digitalis changes  Recent Labs: 01/21/2021: ALT 18; B Natriuretic Peptide 259.3 01/22/2021: Magnesium 2.2; Platelets 124 01/23/2021: BUN 21; Creatinine, Ser 0.99; Hemoglobin 9.9; Potassium 3.8; Sodium 139  Recent Lipid Panel No results found for: CHOL, TRIG, HDL, CHOLHDL, VLDL, LDLCALC, LDLDIRECT  Physical Exam:    VS:  BP 132/62 (BP Location: Right Arm, Patient Position: Sitting)   Pulse (!) 55   Ht 5' (1.524 m)   Wt 120 lb 6.4 oz (54.6 kg)   SpO2 94%   BMI 23.51 kg/m     Wt Readings from Last 3 Encounters:  03/14/21 120 lb 6.4 oz (54.6 kg)  03/02/21 115 lb 12.8 oz (52.5 kg)  01/27/21 121 lb (54.9 kg)     GEN: She looks frail she has marked pallor of the skin as well as conjunctival but not hand creases.  Irregular rhythm variable first heart sound no murmur well nourished, well developed in no  acute distress HEENT: Normal NECK: No JVD; No carotid bruits LYMPHATICS: No lymphadenopathy CARDIAC: Irregular rhythm variable first heart sound  RESPIRATORY:  Clear to auscultation without rales, wheezing or rhonchi  ABDOMEN: Soft, non-tender, non-distended MUSCULOSKELETAL:  No edema; No deformity  SKIN: Warm and dry NEUROLOGIC:  Alert and oriented x  3 PSYCHIATRIC:  Normal affect    Signed, Shirlee More, MD  03/14/2021 4:15 PM    [facility]

## 2021-03-14 NOTE — Patient Instructions (Signed)
Medication Instructions:  Your physician recommends that you continue on your current medications as directed. Please refer to the Current Medication list given to you today.  *If you need a refill on your cardiac medications before your next appointment, please call your pharmacy*   Lab Work: Your physician recommends that you return for lab work in: TODAY CBC, BMP, ProBNP, Digoxin level If you have labs (blood work) drawn today and your tests are completely normal, you will receive your results only by: [place] (if you have MyChart) OR A paper copy in the mail If you have any lab test that is abnormal or we need to change your treatment, we will call you to review the results.   Testing/Procedures: None   Follow-Up: At [facility], you and your health needs are our priority.  As part of our continuing mission to provide you with exceptional heart care, we have created designated Provider Care Teams.  These Care Teams include your primary Cardiologist (physician) and Advanced Practice Providers (APPs -  Physician Assistants and Nurse Practitioners) who all work together to provide you with the care you need, when you need it.  We recommend signing up for the patient portal called "MyChart".  Sign up information is provided on this After Visit Summary.  MyChart is used to connect with patients for Virtual Visits (Telemedicine).  Patients are able to view lab/test results, encounter notes, upcoming appointments, etc.  Non-urgent messages can be sent to your provider as well.   To learn more about what you can do with MyChart, go to [email or web address].    Your next appointment:   3 month(s)  The format for your next appointment:   In Person  Provider:   Shirlee More, MD   Other Instructions

## 2021-03-15 ENCOUNTER — Telehealth: Payer: Self-pay | Admitting: Cardiology

## 2021-03-15 ENCOUNTER — Telehealth: Payer: Self-pay

## 2021-03-15 LAB — CBC
Hematocrit: 38 % (ref 34.0–46.6)
Hemoglobin: 12.8 g/dL (ref 11.1–15.9)
MCH: 30.9 pg (ref 26.6–33.0)
MCHC: 33.7 g/dL (ref 31.5–35.7)
MCV: 92 fL (ref 79–97)
Platelets: 229 10*3/uL (ref 150–450)
RBC: 4.14 x10E6/uL (ref 3.77–5.28)
RDW: 13.9 % (ref 11.7–15.4)
WBC: 7.4 10*3/uL (ref 3.4–10.8)

## 2021-03-15 LAB — PRO B NATRIURETIC PEPTIDE: NT-Pro BNP: 1136 pg/mL — ABNORMAL HIGH (ref 0–738)

## 2021-03-15 LAB — BASIC METABOLIC PANEL
BUN/Creatinine Ratio: 12 (ref 12–28)
BUN: 14 mg/dL (ref 8–27)
CO2: 22 mmol/L (ref 20–29)
Calcium: 9 mg/dL (ref 8.7–10.3)
Chloride: 103 mmol/L (ref 96–106)
Creatinine, Ser: 1.13 mg/dL — ABNORMAL HIGH (ref 0.57–1.00)
Glucose: 182 mg/dL — ABNORMAL HIGH (ref 65–99)
Potassium: 4.2 mmol/L (ref 3.5–5.2)
Sodium: 143 mmol/L (ref 134–144)
eGFR: 49 mL/min/{1.73_m2} — ABNORMAL LOW (ref >59)

## 2021-03-15 LAB — DIGOXIN LEVEL: Digoxin, Serum: 0.6 ng/mL (ref 0.5–0.9)

## 2021-03-15 NOTE — Telephone Encounter (Signed)
-----   Message from Richardo Priest, MD sent at 03/15/2021  8:05 AM EDT ----- Stable results no change in treatment

## 2021-03-15 NOTE — Telephone Encounter (Signed)
Left message on patients voicemail to please return our call.   

## 2021-03-15 NOTE — Telephone Encounter (Signed)
Follow Up:    Patient's daughter is returning Morgan's call from today.

## 2021-03-15 NOTE — Telephone Encounter (Signed)
Spoke with patient regarding results and recommendation.  Patient verbalizes understanding and is agreeable to plan of care. Advised patient to call back with any issues or concerns.  

## 2021-04-25 DIAGNOSIS — Z1331 Encounter for screening for depression: Secondary | ICD-10-CM | POA: Diagnosis not present

## 2021-04-25 DIAGNOSIS — E785 Hyperlipidemia, unspecified: Secondary | ICD-10-CM | POA: Diagnosis not present

## 2021-04-25 DIAGNOSIS — Z9181 History of falling: Secondary | ICD-10-CM | POA: Diagnosis not present

## 2021-04-25 DIAGNOSIS — Z0000 Encounter for general adult medical examination without abnormal findings: Secondary | ICD-10-CM | POA: Diagnosis not present

## 2021-04-27 DIAGNOSIS — Z955 Presence of coronary angioplasty implant and graft: Secondary | ICD-10-CM | POA: Diagnosis not present

## 2021-04-27 DIAGNOSIS — I5032 Chronic diastolic (congestive) heart failure: Secondary | ICD-10-CM | POA: Diagnosis not present

## 2021-04-27 DIAGNOSIS — I4891 Unspecified atrial fibrillation: Secondary | ICD-10-CM | POA: Diagnosis not present

## 2021-04-27 DIAGNOSIS — N189 Chronic kidney disease, unspecified: Secondary | ICD-10-CM | POA: Diagnosis not present

## 2021-04-27 DIAGNOSIS — R011 Cardiac murmur, unspecified: Secondary | ICD-10-CM | POA: Diagnosis not present

## 2021-04-29 DIAGNOSIS — I5032 Chronic diastolic (congestive) heart failure: Secondary | ICD-10-CM | POA: Diagnosis not present

## 2021-04-29 DIAGNOSIS — N189 Chronic kidney disease, unspecified: Secondary | ICD-10-CM | POA: Diagnosis not present

## 2021-04-29 DIAGNOSIS — I4891 Unspecified atrial fibrillation: Secondary | ICD-10-CM | POA: Diagnosis not present

## 2021-04-29 DIAGNOSIS — Z955 Presence of coronary angioplasty implant and graft: Secondary | ICD-10-CM | POA: Diagnosis not present

## 2021-04-29 DIAGNOSIS — R011 Cardiac murmur, unspecified: Secondary | ICD-10-CM | POA: Diagnosis not present

## 2021-05-02 DIAGNOSIS — Z955 Presence of coronary angioplasty implant and graft: Secondary | ICD-10-CM | POA: Diagnosis not present

## 2021-05-02 DIAGNOSIS — I4891 Unspecified atrial fibrillation: Secondary | ICD-10-CM | POA: Diagnosis not present

## 2021-05-02 DIAGNOSIS — R011 Cardiac murmur, unspecified: Secondary | ICD-10-CM | POA: Diagnosis not present

## 2021-05-02 DIAGNOSIS — I5032 Chronic diastolic (congestive) heart failure: Secondary | ICD-10-CM | POA: Diagnosis not present

## 2021-05-02 DIAGNOSIS — N189 Chronic kidney disease, unspecified: Secondary | ICD-10-CM | POA: Diagnosis not present

## 2021-05-04 DIAGNOSIS — I4891 Unspecified atrial fibrillation: Secondary | ICD-10-CM | POA: Diagnosis not present

## 2021-05-04 DIAGNOSIS — I5032 Chronic diastolic (congestive) heart failure: Secondary | ICD-10-CM | POA: Diagnosis not present

## 2021-05-04 DIAGNOSIS — N189 Chronic kidney disease, unspecified: Secondary | ICD-10-CM | POA: Diagnosis not present

## 2021-05-04 DIAGNOSIS — Z955 Presence of coronary angioplasty implant and graft: Secondary | ICD-10-CM | POA: Diagnosis not present

## 2021-05-04 DIAGNOSIS — R011 Cardiac murmur, unspecified: Secondary | ICD-10-CM | POA: Diagnosis not present

## 2021-05-06 DIAGNOSIS — Z955 Presence of coronary angioplasty implant and graft: Secondary | ICD-10-CM | POA: Diagnosis not present

## 2021-05-06 DIAGNOSIS — R011 Cardiac murmur, unspecified: Secondary | ICD-10-CM | POA: Diagnosis not present

## 2021-05-06 DIAGNOSIS — I4891 Unspecified atrial fibrillation: Secondary | ICD-10-CM | POA: Diagnosis not present

## 2021-05-06 DIAGNOSIS — I5032 Chronic diastolic (congestive) heart failure: Secondary | ICD-10-CM | POA: Diagnosis not present

## 2021-05-06 DIAGNOSIS — N189 Chronic kidney disease, unspecified: Secondary | ICD-10-CM | POA: Diagnosis not present

## 2021-05-09 DIAGNOSIS — I5032 Chronic diastolic (congestive) heart failure: Secondary | ICD-10-CM | POA: Diagnosis not present

## 2021-05-09 DIAGNOSIS — R011 Cardiac murmur, unspecified: Secondary | ICD-10-CM | POA: Diagnosis not present

## 2021-05-09 DIAGNOSIS — I4891 Unspecified atrial fibrillation: Secondary | ICD-10-CM | POA: Diagnosis not present

## 2021-05-09 DIAGNOSIS — Z955 Presence of coronary angioplasty implant and graft: Secondary | ICD-10-CM | POA: Diagnosis not present

## 2021-05-09 DIAGNOSIS — N189 Chronic kidney disease, unspecified: Secondary | ICD-10-CM | POA: Diagnosis not present

## 2021-05-11 DIAGNOSIS — I5032 Chronic diastolic (congestive) heart failure: Secondary | ICD-10-CM | POA: Diagnosis not present

## 2021-05-11 DIAGNOSIS — R011 Cardiac murmur, unspecified: Secondary | ICD-10-CM | POA: Diagnosis not present

## 2021-05-11 DIAGNOSIS — Z955 Presence of coronary angioplasty implant and graft: Secondary | ICD-10-CM | POA: Diagnosis not present

## 2021-05-11 DIAGNOSIS — N189 Chronic kidney disease, unspecified: Secondary | ICD-10-CM | POA: Diagnosis not present

## 2021-05-11 DIAGNOSIS — I4891 Unspecified atrial fibrillation: Secondary | ICD-10-CM | POA: Diagnosis not present

## 2021-05-16 DIAGNOSIS — R630 Anorexia: Secondary | ICD-10-CM | POA: Diagnosis not present

## 2021-05-16 DIAGNOSIS — I480 Paroxysmal atrial fibrillation: Secondary | ICD-10-CM | POA: Diagnosis not present

## 2021-05-16 DIAGNOSIS — I10 Essential (primary) hypertension: Secondary | ICD-10-CM | POA: Diagnosis not present

## 2021-05-16 DIAGNOSIS — Z79899 Other long term (current) drug therapy: Secondary | ICD-10-CM | POA: Diagnosis not present

## 2021-05-16 DIAGNOSIS — Z955 Presence of coronary angioplasty implant and graft: Secondary | ICD-10-CM | POA: Diagnosis not present

## 2021-05-16 DIAGNOSIS — R531 Weakness: Secondary | ICD-10-CM | POA: Diagnosis not present

## 2021-05-16 DIAGNOSIS — I5032 Chronic diastolic (congestive) heart failure: Secondary | ICD-10-CM | POA: Diagnosis not present

## 2021-05-18 DIAGNOSIS — Z955 Presence of coronary angioplasty implant and graft: Secondary | ICD-10-CM | POA: Diagnosis not present

## 2021-05-20 DIAGNOSIS — Z955 Presence of coronary angioplasty implant and graft: Secondary | ICD-10-CM | POA: Diagnosis not present

## 2021-05-23 DIAGNOSIS — Z955 Presence of coronary angioplasty implant and graft: Secondary | ICD-10-CM | POA: Diagnosis not present

## 2021-05-25 DIAGNOSIS — Z955 Presence of coronary angioplasty implant and graft: Secondary | ICD-10-CM | POA: Diagnosis not present

## 2021-05-27 DIAGNOSIS — Z955 Presence of coronary angioplasty implant and graft: Secondary | ICD-10-CM | POA: Diagnosis not present

## 2021-05-30 DIAGNOSIS — Z955 Presence of coronary angioplasty implant and graft: Secondary | ICD-10-CM | POA: Diagnosis not present

## 2021-06-01 DIAGNOSIS — Z955 Presence of coronary angioplasty implant and graft: Secondary | ICD-10-CM | POA: Diagnosis not present

## 2021-06-03 DIAGNOSIS — R0789 Other chest pain: Secondary | ICD-10-CM | POA: Diagnosis not present

## 2021-06-03 DIAGNOSIS — K219 Gastro-esophageal reflux disease without esophagitis: Secondary | ICD-10-CM | POA: Diagnosis not present

## 2021-06-08 DIAGNOSIS — Z955 Presence of coronary angioplasty implant and graft: Secondary | ICD-10-CM | POA: Diagnosis not present

## 2021-06-10 DIAGNOSIS — Z955 Presence of coronary angioplasty implant and graft: Secondary | ICD-10-CM | POA: Diagnosis not present

## 2021-06-13 DIAGNOSIS — D638 Anemia in other chronic diseases classified elsewhere: Secondary | ICD-10-CM | POA: Diagnosis not present

## 2021-06-13 DIAGNOSIS — Z955 Presence of coronary angioplasty implant and graft: Secondary | ICD-10-CM | POA: Diagnosis not present

## 2021-06-13 DIAGNOSIS — E785 Hyperlipidemia, unspecified: Secondary | ICD-10-CM | POA: Diagnosis not present

## 2021-06-13 DIAGNOSIS — Z23 Encounter for immunization: Secondary | ICD-10-CM | POA: Diagnosis not present

## 2021-06-13 DIAGNOSIS — R7301 Impaired fasting glucose: Secondary | ICD-10-CM | POA: Diagnosis not present

## 2021-06-13 DIAGNOSIS — G8194 Hemiplegia, unspecified affecting left nondominant side: Secondary | ICD-10-CM | POA: Diagnosis not present

## 2021-06-13 DIAGNOSIS — I10 Essential (primary) hypertension: Secondary | ICD-10-CM | POA: Diagnosis not present

## 2021-06-13 DIAGNOSIS — N183 Chronic kidney disease, stage 3 unspecified: Secondary | ICD-10-CM | POA: Diagnosis not present

## 2021-06-13 DIAGNOSIS — I5032 Chronic diastolic (congestive) heart failure: Secondary | ICD-10-CM | POA: Diagnosis not present

## 2021-06-13 DIAGNOSIS — I480 Paroxysmal atrial fibrillation: Secondary | ICD-10-CM | POA: Diagnosis not present

## 2021-06-15 ENCOUNTER — Ambulatory Visit: Payer: Medicare HMO | Admitting: Cardiology

## 2021-06-15 ENCOUNTER — Other Ambulatory Visit: Payer: Self-pay

## 2021-06-15 ENCOUNTER — Encounter: Payer: Self-pay | Admitting: Cardiology

## 2021-06-15 VITALS — BP 112/52 | HR 87 | Ht 66.0 in | Wt 112.8 lb

## 2021-06-15 DIAGNOSIS — Z955 Presence of coronary angioplasty implant and graft: Secondary | ICD-10-CM | POA: Diagnosis not present

## 2021-06-15 DIAGNOSIS — Z9861 Coronary angioplasty status: Secondary | ICD-10-CM

## 2021-06-15 DIAGNOSIS — Z9889 Other specified postprocedural states: Secondary | ICD-10-CM | POA: Diagnosis not present

## 2021-06-15 DIAGNOSIS — I119 Hypertensive heart disease without heart failure: Secondary | ICD-10-CM

## 2021-06-15 DIAGNOSIS — Z79899 Other long term (current) drug therapy: Secondary | ICD-10-CM

## 2021-06-15 DIAGNOSIS — I482 Chronic atrial fibrillation, unspecified: Secondary | ICD-10-CM | POA: Diagnosis not present

## 2021-06-15 DIAGNOSIS — Z7901 Long term (current) use of anticoagulants: Secondary | ICD-10-CM

## 2021-06-15 DIAGNOSIS — E7849 Other hyperlipidemia: Secondary | ICD-10-CM | POA: Diagnosis not present

## 2021-06-15 DIAGNOSIS — Z95818 Presence of other cardiac implants and grafts: Secondary | ICD-10-CM | POA: Diagnosis not present

## 2021-06-15 DIAGNOSIS — I2510 Atherosclerotic heart disease of native coronary artery without angina pectoris: Secondary | ICD-10-CM

## 2021-06-15 NOTE — Progress Notes (Signed)
Cardiology Office Note:    Date:  06/15/2021   ID:  Teresa Hughes, DOB 12-Aug-1941, MRN [id number]  PCP:  Nicoletta Dress, MD  Cardiologist:  Shirlee More, MD    Referring MD: Nicoletta Dress, MD please do a digoxin level every 6 months with your office labs   ASSESSMENT:    1. S/P mitral valve clip implantation   2. CAD S/P percutaneous coronary angioplasty   3. Chronic atrial fibrillation ([place])   4. Chronic anticoagulation   5. High risk medication use   6. Hypertensive heart disease without heart failure   7. Other hyperlipidemia    PLAN:    In order of problems listed above:  [name] continues to do well has had a really nice functional response from PCI and MitraClip.  Presently New York Heart Association class I.  She will transition to single anticoagulant therapy 06/24/2021 and will complete cardiac rehabilitation. Stable rate controlled I remind her PCP to check a digoxin level with office labs Continue reduced dose anticoagulant age body mass Continue digoxin for PCP checks labs on a regular basis might skip to check levels in the office Stable no findings of heart failure Continue high intensity statin LDL at target   Next appointment: 6 months   Medication Adjustments/Labs and Tests Ordered: Current medicines are reviewed at length with the patient today.  Concerns regarding medicines are outlined above.  No orders of the defined types were placed in this encounter.  No orders of the defined types were placed in this encounter.   Chief Complaint  Patient presents with   Follow-up   Congestive Heart Failure   Mitral Regurgitation   Coronary Artery Disease     History of Present Illness:    Teresa Hughes is a 80 y.o. female with a hx of severe symptomatic mitral regurgitation with P2 flail leaflet mitral valve prolapse and edge-to-edge repair with MitraClip 01/20/2021.  Early course was complicated by hemopericardium with tamponade requiring  pericardiocentesis and drain.  Other problems include multivessel CAD with PCI and stenting and atherectomy of the mid right coronary artery.  Most recent echocardiogram shows EF of 50% residual trivial mitral regurgitation with a mean gradient 2 mmHg.  She is on combined therapy with clopidogrel and anticoagulant and is to discontinue her clopidogrel as of 06/24/2021.  She was last seen 0801 2 022 and is engaged in cardiac rehabilitation. Compliance with diet, lifestyle and medications: yes She continues to do well strength and endurance are better he is not having chest pain edema shortness of breath palpitation or syncope. She will stop clopidogrel 06/24/2021 has a bruise in her right leg from combined antiplatelet anticoagulant therapy She has labs follow-up with her PCP office.  05/16/2021 hemoglobin 11.0 creatinine 0.86 potassium 4.4. Last lipid profile 03/11/2021 shows a cholesterol 119 LDL 51 triglycerides 258 HDL 26 Past Medical History:  Diagnosis Date   Acute kidney injury ([place])    Anemia of chronic disease    Aneurysm ([place]) 07/01/2018   Anxiety    Asthma    Cerebral thrombosis with cerebral infarction ([place]) 05/07/2012   CHF (congestive heart failure) (HCC)    Chronic anticoagulation 08/28/2016   Chronic atrial fibrillation (HCC) 08/28/2016   Chronic insomnia    Chronic renal disease    Coronary artery disease    DDD (degenerative disc disease), cervical    Dyspnea    Dysrhythmia    Esophageal stricture    GERD (gastroesophageal reflux disease)  Gout    Heart murmur    Hiatal hernia    High risk medication use 08/28/2016   Hypercalcemia    Hypertension    Hypertensive heart disease without heart failure 08/28/2016   Late effects of CVA (cerebrovascular accident)    Macular degeneration, dry    Osteopenia    Osteoporosis    Other hyperlipidemia 08/28/2016   Paroxysmal SVT (supraventricular tachycardia) (HCC)    Pneumonia    S/P mitral valve clip implantation  01/20/2021   s/p TEER mitral valve repair using a MitraClip G4 XT W device, positioned A2 P2, reducing mitral regurgitation from severe (4+) at baseline to mild (1+) post procedure   Sleep apnea    Vitamin D deficiency     Past Surgical History:  Procedure Laterality Date   CARDIAC CATHETERIZATION     CATARACT EXTRACTION Bilateral    Right 04/02/17, Left 9/22018   CORONARY ATHERECTOMY N/A 12/22/2020   Procedure: CORONARY ATHERECTOMY;  Surgeon: [name], MD;  Location: [place] [facility];  Service: Cardiovascular;  Laterality: N/A;   CORONARY STENT INTERVENTION N/A 12/22/2020   Procedure: CORONARY STENT INTERVENTION;  Surgeon: [name], MD;  Location: [facility] [facility];  Service: Cardiovascular;  Laterality: N/A;   ERCP W/ SPHINCTEROTOMY AND BALLOON DILATION     EYE SURGERY     HEMORRHOID SURGERY  2016   MITRAL VALVE REPAIR N/A 01/20/2021   Procedure: MITRAL VALVE REPAIR;  Surgeon: [name], MD;  Location: [place] [facility];  Service: Cardiovascular;  Laterality: N/A;   PERICARDIOCENTESIS N/A 01/20/2021   Procedure: PERICARDIOCENTESIS;  Surgeon: Jettie Booze, MD;  Location: [facility] [facility];  Service: Cardiovascular;  Laterality: N/A;   RIGHT/LEFT HEART CATH AND CORONARY ANGIOGRAPHY N/A 10/29/2020   Procedure: RIGHT/LEFT HEART CATH AND CORONARY ANGIOGRAPHY;  Surgeon: [name], MD;  Location: [place] [facility];  Service: Cardiovascular;  Laterality: N/A;   TEE WITHOUT CARDIOVERSION N/A 09/14/2020   Procedure: TRANSESOPHAGEAL ECHOCARDIOGRAM (TEE);  Surgeon: Buford Dresser, MD;  Location: [facility] ENDOSCOPY;  Service: Cardiovascular;  Laterality: N/A;   TEE WITHOUT CARDIOVERSION N/A 01/20/2021   Procedure: TRANSESOPHAGEAL ECHOCARDIOGRAM (TEE);  Surgeon: [name], MD;  Location: [place] [facility];  Service: Cardiovascular;  Laterality: N/A;    Current Medications: Current Meds  Medication Sig   alendronate (FOSAMAX) 70 MG tablet Take 70 mg by mouth  every Sunday. Take with a full glass of water on an empty stomach.   allopurinol (ZYLOPRIM) 300 MG tablet Take 300 mg by mouth at bedtime.   apixaban (ELIQUIS) 2.5 MG TABS tablet Take 1 tablet (2.5 mg total) by mouth 2 (two) times daily.   clopidogrel (PLAVIX) 75 MG tablet Take 1 tablet (75 mg total) by mouth daily.   furosemide (LASIX) 40 MG tablet Take 40 mg by mouth daily.   mirtazapine (REMERON) 15 MG tablet Take 7.5 mg by mouth at bedtime.   omega-3 acid ethyl esters (LOVAZA) 1 g capsule Take 2 g by mouth 2 (two) times daily.   omeprazole (PRILOSEC) 20 MG capsule Take 20 mg by mouth daily.   pantoprazole (PROTONIX) 40 MG tablet Take 1 tablet (40 mg total) by mouth daily.   polyethylene glycol (MIRALAX / GLYCOLAX) 17 g packet Take 17 g by mouth daily.   rOPINIRole (REQUIP) 1 MG tablet Take 1 mg by mouth at bedtime.   rosuvastatin (CRESTOR) 20 MG tablet Take 1 tablet (20 mg total) by mouth daily.     Allergies:   Propranolol,  Sulfamethoxazole, Codeine, and Latex   Social History   Socioeconomic History   Marital status: Unknown    Spouse name: Not on file   Number of children: Not on file   Years of education: Not on file   Highest education level: Not on file  Occupational History   Not on file  Tobacco Use   Smoking status: Never   Smokeless tobacco: Current    Types: Snuff  Vaping Use   Vaping Use: Never used  Substance and Sexual Activity   Alcohol use: Never   Drug use: Never   Sexual activity: Not on file  Other Topics Concern   Not on file  Social History Narrative   Not on file   Social Determinants of Health   Financial Resource Strain: Not on file  Food Insecurity: Not on file  Transportation Needs: Not on file  Physical Activity: Not on file  Stress: Not on file  Social Connections: Not on file     Family History: The patient's family history includes Aneurysm in her maternal grandmother; Congestive Heart Failure in her father; Diabetes in her mother;  Heart attack in her brother, father, and sister; Heart disease in her brother, father, maternal grandfather, and maternal grandmother; Hypertension in her brother and mother; Skin cancer in her brother; Stroke in her maternal grandfather and mother. ROS:   Please see the history of present illness.    All other systems reviewed and are negative.  EKGs/Labs/Other Studies Reviewed:    The following studies were reviewed today:  EKG:  EKG ordered today and personally reviewed.  The ekg ordered today demonstrates atrial fibrillation controlled rate repolarization versus digoxin changes  Recent Labs: 01/21/2021: ALT 18; B Natriuretic Peptide 259.3 01/22/2021: Magnesium 2.2 03/14/2021: BUN 14; Creatinine, Ser 1.13; Hemoglobin 12.8; NT-Pro BNP 1,136; Platelets 229; Potassium 4.2; Sodium 143  Recent Lipid Panel No results found for: CHOL, TRIG, HDL, CHOLHDL, VLDL, LDLCALC, LDLDIRECT  Physical Exam:    VS:  BP (!) 112/52   Pulse 87   Ht 5\' 6"  (1.676 m)   Wt 112 lb 12.8 oz (51.2 kg)   SpO2 97%   BMI 18.21 kg/m     Wt Readings from Last 3 Encounters:  06/15/21 112 lb 12.8 oz (51.2 kg)  03/14/21 120 lb 6.4 oz (54.6 kg)  03/02/21 115 lb 12.8 oz (52.5 kg)     GEN: She looks less frail well nourished, well developed in no acute distress HEENT: Normal NECK: No JVD; No carotid bruits LYMPHATICS: No lymphadenopathy CARDIAC: Irregular rate and rhythm RRR, no murmurs, rubs, gallops RESPIRATORY:  Clear to auscultation without rales, wheezing or rhonchi  ABDOMEN: Soft, non-tender, non-distended MUSCULOSKELETAL:  No edema; No deformity  SKIN: Warm and dry NEUROLOGIC:  Alert and oriented x 3 PSYCHIATRIC:  Normal affect    Signed, Shirlee More, MD  06/15/2021 1:00 PM    [place]

## 2021-06-15 NOTE — Patient Instructions (Signed)
Medication Instructions:  Your physician has recommended you make the following change in your medication:  STOP: Plavix on 06/24/2021 *If you need a refill on your cardiac medications before your next appointment, please call your pharmacy*   Lab Work: None If you have labs (blood work) drawn today and your tests are completely normal, you will receive your results only by: [place] (if you have MyChart) OR A paper copy in the mail If you have any lab test that is abnormal or we need to change your treatment, we will call you to review the results.   Testing/Procedures: None   Follow-Up: At [facility], you and your health needs are our priority.  As part of our continuing mission to provide you with exceptional heart care, we have created designated Provider Care Teams.  These Care Teams include your primary Cardiologist (physician) and Advanced Practice Providers (APPs -  Physician Assistants and Nurse Practitioners) who all work together to provide you with the care you need, when you need it.  We recommend signing up for the patient portal called "MyChart".  Sign up information is provided on this After Visit Summary.  MyChart is used to connect with patients for Virtual Visits (Telemedicine).  Patients are able to view lab/test results, encounter notes, upcoming appointments, etc.  Non-urgent messages can be sent to your provider as well.   To learn more about what you can do with MyChart, go to [email or web address].    Your next appointment:   6 month(s)  The format for your next appointment:   In Person  Provider:   Shirlee More, MD   Other Instructions

## 2021-06-17 DIAGNOSIS — Z955 Presence of coronary angioplasty implant and graft: Secondary | ICD-10-CM | POA: Diagnosis not present

## 2021-06-20 DIAGNOSIS — Z5321 Procedure and treatment not carried out due to patient leaving prior to being seen by health care provider: Secondary | ICD-10-CM | POA: Diagnosis not present

## 2021-06-20 DIAGNOSIS — Z955 Presence of coronary angioplasty implant and graft: Secondary | ICD-10-CM | POA: Diagnosis not present

## 2021-06-20 DIAGNOSIS — S7011XA Contusion of right thigh, initial encounter: Secondary | ICD-10-CM | POA: Diagnosis not present

## 2021-06-20 DIAGNOSIS — M25551 Pain in right hip: Secondary | ICD-10-CM | POA: Diagnosis not present

## 2021-06-30 ENCOUNTER — Other Ambulatory Visit: Payer: Self-pay

## 2021-06-30 ENCOUNTER — Encounter (INDEPENDENT_AMBULATORY_CARE_PROVIDER_SITE_OTHER): Payer: Medicare HMO | Admitting: Ophthalmology

## 2021-06-30 DIAGNOSIS — H353112 Nonexudative age-related macular degeneration, right eye, intermediate dry stage: Secondary | ICD-10-CM | POA: Diagnosis not present

## 2021-06-30 DIAGNOSIS — I10 Essential (primary) hypertension: Secondary | ICD-10-CM | POA: Diagnosis not present

## 2021-06-30 DIAGNOSIS — H353221 Exudative age-related macular degeneration, left eye, with active choroidal neovascularization: Secondary | ICD-10-CM | POA: Diagnosis not present

## 2021-06-30 DIAGNOSIS — H348312 Tributary (branch) retinal vein occlusion, right eye, stable: Secondary | ICD-10-CM

## 2021-06-30 DIAGNOSIS — H35033 Hypertensive retinopathy, bilateral: Secondary | ICD-10-CM | POA: Diagnosis not present

## 2021-06-30 DIAGNOSIS — H43813 Vitreous degeneration, bilateral: Secondary | ICD-10-CM | POA: Diagnosis not present

## 2021-07-11 DIAGNOSIS — Z955 Presence of coronary angioplasty implant and graft: Secondary | ICD-10-CM | POA: Diagnosis not present

## 2021-09-09 DIAGNOSIS — N183 Chronic kidney disease, stage 3 unspecified: Secondary | ICD-10-CM | POA: Diagnosis not present

## 2021-09-09 DIAGNOSIS — M109 Gout, unspecified: Secondary | ICD-10-CM | POA: Diagnosis not present

## 2021-09-09 DIAGNOSIS — E785 Hyperlipidemia, unspecified: Secondary | ICD-10-CM | POA: Diagnosis not present

## 2021-09-09 DIAGNOSIS — R7301 Impaired fasting glucose: Secondary | ICD-10-CM | POA: Diagnosis not present

## 2021-09-09 DIAGNOSIS — D638 Anemia in other chronic diseases classified elsewhere: Secondary | ICD-10-CM | POA: Diagnosis not present

## 2021-09-09 DIAGNOSIS — Z79899 Other long term (current) drug therapy: Secondary | ICD-10-CM | POA: Diagnosis not present

## 2021-09-09 DIAGNOSIS — I5032 Chronic diastolic (congestive) heart failure: Secondary | ICD-10-CM | POA: Diagnosis not present

## 2021-09-09 DIAGNOSIS — I10 Essential (primary) hypertension: Secondary | ICD-10-CM | POA: Diagnosis not present

## 2021-09-09 DIAGNOSIS — G8194 Hemiplegia, unspecified affecting left nondominant side: Secondary | ICD-10-CM | POA: Diagnosis not present

## 2021-09-09 DIAGNOSIS — I480 Paroxysmal atrial fibrillation: Secondary | ICD-10-CM | POA: Diagnosis not present

## 2021-09-28 ENCOUNTER — Other Ambulatory Visit: Payer: Self-pay | Admitting: Physician Assistant

## 2021-09-28 DIAGNOSIS — Z95818 Presence of other cardiac implants and grafts: Secondary | ICD-10-CM

## 2021-09-28 DIAGNOSIS — I340 Nonrheumatic mitral (valve) insufficiency: Secondary | ICD-10-CM

## 2021-10-12 DIAGNOSIS — Z1231 Encounter for screening mammogram for malignant neoplasm of breast: Secondary | ICD-10-CM | POA: Diagnosis not present

## 2021-12-01 ENCOUNTER — Encounter (INDEPENDENT_AMBULATORY_CARE_PROVIDER_SITE_OTHER): Payer: Medicare HMO | Admitting: Ophthalmology

## 2021-12-01 DIAGNOSIS — H35033 Hypertensive retinopathy, bilateral: Secondary | ICD-10-CM | POA: Diagnosis not present

## 2021-12-01 DIAGNOSIS — H348312 Tributary (branch) retinal vein occlusion, right eye, stable: Secondary | ICD-10-CM | POA: Diagnosis not present

## 2021-12-01 DIAGNOSIS — I10 Essential (primary) hypertension: Secondary | ICD-10-CM | POA: Diagnosis not present

## 2021-12-01 DIAGNOSIS — H353221 Exudative age-related macular degeneration, left eye, with active choroidal neovascularization: Secondary | ICD-10-CM

## 2021-12-01 DIAGNOSIS — H353112 Nonexudative age-related macular degeneration, right eye, intermediate dry stage: Secondary | ICD-10-CM

## 2021-12-01 DIAGNOSIS — H43813 Vitreous degeneration, bilateral: Secondary | ICD-10-CM

## 2021-12-05 ENCOUNTER — Encounter (INDEPENDENT_AMBULATORY_CARE_PROVIDER_SITE_OTHER): Payer: Self-pay | Admitting: Ophthalmology

## 2021-12-05 ENCOUNTER — Ambulatory Visit (INDEPENDENT_AMBULATORY_CARE_PROVIDER_SITE_OTHER): Payer: Medicare HMO | Admitting: Ophthalmology

## 2021-12-05 DIAGNOSIS — H1012 Acute atopic conjunctivitis, left eye: Secondary | ICD-10-CM

## 2021-12-05 DIAGNOSIS — H353221 Exudative age-related macular degeneration, left eye, with active choroidal neovascularization: Secondary | ICD-10-CM

## 2021-12-05 DIAGNOSIS — G4733 Obstructive sleep apnea (adult) (pediatric): Secondary | ICD-10-CM

## 2021-12-05 HISTORY — DX: Acute atopic conjunctivitis, left eye: H10.12

## 2021-12-05 HISTORY — DX: Exudative age-related macular degeneration, left eye, with active choroidal neovascularization: H35.3221

## 2021-12-05 NOTE — Patient Instructions (Signed)
#  4.  I recommend the patient commence with treatment of [place] once a day to treat ongoing ocular surface itching. ? ?Specifically I also asked her not to mash or rub or compress the eye ?

## 2021-12-05 NOTE — Assessment & Plan Note (Signed)
Under the care of Dr. Tempie Hoist, post recent injection patient complaint of mild lid edema and questionable change in vision and a few more floaters. ? ?Vision assessed and found without her glasses to be 20/25 -1.  This is via pinhole which is similar to her previous vision.  There is no anterior segment inflammation of the anterior chamber nor posterior chamber no vitreous cell nor vitreous debris.  There is a PVD. ? ?There is no obvious change in the hemorrhagic nature of the of AMD. ?

## 2021-12-05 NOTE — Progress Notes (Signed)
? ? ?12/05/2021 ? ?  ? ?CHIEF COMPLAINT ?Patient presents for  ?Chief Complaint  ?Patient presents with  ? Retina Evaluation  ? ? ? ? ?HISTORY OF PRESENT ILLNESS: ?Teresa Hughes is a 81 y.o. female who presents to the clinic today for:  ? ?HPI   ? ? Retina Evaluation   ? ?      ? Laterality: left eye  ? ?  ?  ? ? Comments   ?Patient reports that her left eye having itching has been present since prior to the injection but seems to be worse.  Also questionable whether or not there is a change in vision.  And also some gray spots in the left eye. ? ?Uncertain if there is been a decline in vision in the left eye. ? ?Patient had an injection in the left eye for macular degeneration with Dr. Zigmund Daniel within the last 4 days.  No records are available in the [facility] epic system for review from that injection ? ?Patient did not bring her glasses or spectacles for reading today, from the refraction on notes apparently scanned from 12-01-21, vision left eye was 20/25 with a myopic correction.  It was 2020 with essentially no correction.  In the right eye ? ?  ?  ?Last edited by Hurman Horn, MD on 12/05/2021  8:38 PM.  ?  ? ? ?Referring physician: ?Hayden Pedro, MD ?[address] ?[place],  [place] [id number] ? ?HISTORICAL INFORMATION:  ? ?Selected notes from the [facility] ?  ?   ? ?CURRENT MEDICATIONS: ?No current outpatient medications on file. (Ophthalmic Drugs)  ? ?No current facility-administered medications for this visit. (Ophthalmic Drugs)  ? ?Current Outpatient Medications (Other)  ?Medication Sig  ? alendronate (FOSAMAX) 70 MG tablet Take 70 mg by mouth every Sunday. Take with a full glass of water on an empty stomach.  ? allopurinol (ZYLOPRIM) 300 MG tablet Take 300 mg by mouth at bedtime.  ? apixaban (ELIQUIS) 2.5 MG TABS tablet Take 1 tablet (2.5 mg total) by mouth 2 (two) times daily.  ? clopidogrel (PLAVIX) 75 MG tablet Take 1 tablet (75 mg total) by mouth daily.  ? furosemide (LASIX) 40 MG  tablet Take 40 mg by mouth daily.  ? mirtazapine (REMERON) 15 MG tablet Take 7.5 mg by mouth at bedtime.  ? omega-3 acid ethyl esters (LOVAZA) 1 g capsule Take 2 g by mouth 2 (two) times daily.  ? omeprazole (PRILOSEC) 20 MG capsule Take 20 mg by mouth daily.  ? pantoprazole (PROTONIX) 40 MG tablet Take 1 tablet (40 mg total) by mouth daily.  ? polyethylene glycol (MIRALAX / GLYCOLAX) 17 g packet Take 17 g by mouth daily.  ? rOPINIRole (REQUIP) 1 MG tablet Take 1 mg by mouth at bedtime.  ? rosuvastatin (CRESTOR) 20 MG tablet Take 1 tablet (20 mg total) by mouth daily.  ? ?No current facility-administered medications for this visit. (Other)  ? ? ? ? ?REVIEW OF SYSTEMS: ?ROS   ?Negative for: Constitutional, Gastrointestinal, Neurological, Skin, Genitourinary, Musculoskeletal, HENT, Endocrine, Cardiovascular, Eyes, Respiratory, Psychiatric, Allergic/Imm, Heme/Lymph ?Last edited by Hurman Horn, MD on 12/05/2021  8:35 PM.  ?  ? ? ? ?ALLERGIES ?Allergies  ?Allergen Reactions  ? Propranolol Other (See Comments)  ?  Bradycardia  ? Sulfamethoxazole Swelling  ? Codeine Nausea And Vomiting  ? Latex Itching and Rash  ? ? ?PAST MEDICAL HISTORY ?Past Medical History:  ?Diagnosis Date  ? Acute kidney injury (  HCC)   ? Anemia of chronic disease   ? Aneurysm ([place]) 07/01/2018  ? Anxiety   ? Asthma   ? Cerebral thrombosis with cerebral infarction ([place]) 05/07/2012  ? CHF (congestive heart failure) ([place])   ? Chronic anticoagulation 08/28/2016  ? Chronic atrial fibrillation ([place]) 08/28/2016  ? Chronic insomnia   ? Chronic renal disease   ? Coronary artery disease   ? DDD (degenerative disc disease), cervical   ? Dyspnea   ? Dysrhythmia   ? Esophageal stricture   ? GERD (gastroesophageal reflux disease)   ? Gout   ? Heart murmur   ? Hiatal hernia   ? High risk medication use 08/28/2016  ? Hypercalcemia   ? Hypertension   ? Hypertensive heart disease without heart failure 08/28/2016  ? Late effects of CVA (cerebrovascular accident)   ?  Macular degeneration, dry   ? Osteopenia   ? Osteoporosis   ? Other hyperlipidemia 08/28/2016  ? Paroxysmal SVT (supraventricular tachycardia) (HCC)   ? Pneumonia   ? S/P mitral valve clip implantation 01/20/2021  ? s/p TEER mitral valve repair using a MitraClip G4 XT W device, positioned A2 P2, reducing mitral regurgitation from severe (4+) at baseline to mild (1+) post procedure  ? Sleep apnea   ? Vitamin D deficiency   ? ?Past Surgical History:  ?Procedure Laterality Date  ? CARDIAC CATHETERIZATION    ? CATARACT EXTRACTION Bilateral   ? Right 04/02/17, Left 9/22018  ? CORONARY ATHERECTOMY N/A 12/22/2020  ? Procedure: CORONARY ATHERECTOMY;  Surgeon: [name], MD;  Location: [facility];  Service: Cardiovascular;  Laterality: N/A;  ? CORONARY STENT INTERVENTION N/A 12/22/2020  ? Procedure: CORONARY STENT INTERVENTION;  Surgeon: [name], MD;  Location: [facility];  Service: Cardiovascular;  Laterality: N/A;  ? ERCP W/ SPHINCTEROTOMY AND BALLOON DILATION    ? EYE SURGERY    ? HEMORRHOID SURGERY  2016  ? MITRAL VALVE REPAIR N/A 01/20/2021  ? Procedure: MITRAL VALVE REPAIR;  Surgeon: [name], MD;  Location: [facility];  Service: Cardiovascular;  Laterality: N/A;  ? PERICARDIOCENTESIS N/A 01/20/2021  ? Procedure: PERICARDIOCENTESIS;  Surgeon: Jettie Booze, MD;  Location: [facility];  Service: Cardiovascular;  Laterality: N/A;  ? RIGHT/LEFT HEART CATH AND CORONARY ANGIOGRAPHY N/A 10/29/2020  ? Procedure: RIGHT/LEFT HEART CATH AND CORONARY ANGIOGRAPHY;  Surgeon: [name], MD;  Location: [facility];  Service: Cardiovascular;  Laterality: N/A;  ? TEE WITHOUT CARDIOVERSION N/A 09/14/2020  ? Procedure: TRANSESOPHAGEAL ECHOCARDIOGRAM (TEE);  Surgeon: Buford Dresser, MD;  Location: [facility];  Service: Cardiovascular;  Laterality: N/A;  ? TEE WITHOUT CARDIOVERSION N/A 01/20/2021  ? Procedure: TRANSESOPHAGEAL ECHOCARDIOGRAM (TEE);  Surgeon: [name], MD;  Location: [facility];  Service: Cardiovascular;  Laterality: N/A;  ? ? ?FAMILY HISTORY ?Family History  ?Problem Relation Age of Onset  ? Stroke Mother   ? Hypertension Mother   ? Diabetes Mother   ? Heart disease Father   ? Heart attack Father   ? Congestive Heart Failure Father   ? Heart attack Sister   ? Skin cancer Brother   ? Aneurysm Maternal Grandmother   ?     Abdominal  ? Heart disease Maternal Grandmother   ? Stroke Maternal Grandfather   ? Heart disease Maternal Grandfather   ? Hypertension Brother   ? Heart attack Brother   ? Heart disease Brother   ? ? ?SOCIAL HISTORY ?Social History  ? ?  Tobacco Use  ? Smoking status: Never  ? Smokeless tobacco: Current  ?  Types: Snuff  ?Vaping Use  ? Vaping Use: Never used  ?Substance Use Topics  ? Alcohol use: Never  ? Drug use: Never  ? ?  ? ?  ? ?OPHTHALMIC EXAM: ? ?Base Eye Exam   ? ? Visual Acuity (ETDRS)   ? ?   Right Left  ? Dist [place] 20/20 20/50  ? Dist ph [place]  20/25 -1  ? ?  ?  ? ? Tonometry (Tonopen, 8:31 PM)   ? ?   Right Left  ? Pressure  18  ? ?  ?  ? ? Pupils   ? ?   Pupils  ? Right PERRL  ? Left PERRL  ? ?  ?  ? ? Visual Fields   ? ?   Left Right  ?  Full Full  ? ?  ?  ? ? Dilation   ? ? Left eye: 1.0% Mydriacyl, 2.5% Phenylephrine @ 8:31 PM  ? ?  ?  ? ?  ? ?Slit Lamp and Fundus Exam   ? ? External Exam   ? ?   Right Left  ? External  Minor outer lid edema lower lid,  ? ?  ?  ? ? Slit Lamp Exam   ? ?   Right Left  ? Lids/Lashes  Lower lid edema anteriorly, no conjunctival injection no thickening no induration  ? Conjunctiva/Sclera  No injection no induration  ? Cornea  Minor SPK on the surface of the cornea, no infiltrates  ? Anterior Chamber  Deep quiet no cellular reaction  ? [place]ris  Clear details, normal  ? Lens Posterior chamber intraocular lens, Centered posterior chamber intraocular lens Centered posterior chamber intraocular lens  ? Anterior Vitreous Clear anterior vitreous Clear anterior vitreous  ? ?  ?  ? ? Fundus Exam   ? ?    Right Left  ? Posterior Vitreous  Posterior vitreous detachment  ? Disc  Normal  ? C/D Ratio  0.3  ? Macula  Soft drusen  ? Vessels  No major hemorrhagic changes, no obvious vein occlusion  ? Periphery  Periphe

## 2021-12-05 NOTE — Assessment & Plan Note (Signed)
Considerations include potential topical allergy to chronic recurrent use of ophthalmic antibiotic or seasonal allergies ? ?This leads to the patient compressing rubbing the eye as reported by her son. ? ?I suggest patient use Pataday once daily left eye to assist in the symptoms ?

## 2021-12-05 NOTE — Assessment & Plan Note (Signed)
Patient noncompliant with CPAP.  I explained the patient the critical importance of using nightly CPAP if she test positive for sleep apnea so as to maximize the oxygen against of her brain and to her I the part of which requires more oxygen than any other part of the body. ?

## 2021-12-09 DIAGNOSIS — R7301 Impaired fasting glucose: Secondary | ICD-10-CM | POA: Diagnosis not present

## 2021-12-09 DIAGNOSIS — I10 Essential (primary) hypertension: Secondary | ICD-10-CM | POA: Diagnosis not present

## 2021-12-09 DIAGNOSIS — E785 Hyperlipidemia, unspecified: Secondary | ICD-10-CM | POA: Diagnosis not present

## 2021-12-09 DIAGNOSIS — M109 Gout, unspecified: Secondary | ICD-10-CM | POA: Diagnosis not present

## 2021-12-09 DIAGNOSIS — N183 Chronic kidney disease, stage 3 unspecified: Secondary | ICD-10-CM | POA: Diagnosis not present

## 2021-12-09 DIAGNOSIS — G8194 Hemiplegia, unspecified affecting left nondominant side: Secondary | ICD-10-CM | POA: Diagnosis not present

## 2021-12-09 DIAGNOSIS — D638 Anemia in other chronic diseases classified elsewhere: Secondary | ICD-10-CM | POA: Diagnosis not present

## 2021-12-09 DIAGNOSIS — Z79899 Other long term (current) drug therapy: Secondary | ICD-10-CM | POA: Diagnosis not present

## 2021-12-09 DIAGNOSIS — I5032 Chronic diastolic (congestive) heart failure: Secondary | ICD-10-CM | POA: Diagnosis not present

## 2021-12-09 DIAGNOSIS — I480 Paroxysmal atrial fibrillation: Secondary | ICD-10-CM | POA: Diagnosis not present

## 2021-12-11 DIAGNOSIS — I5032 Chronic diastolic (congestive) heart failure: Secondary | ICD-10-CM | POA: Diagnosis not present

## 2021-12-11 DIAGNOSIS — I10 Essential (primary) hypertension: Secondary | ICD-10-CM | POA: Diagnosis not present

## 2021-12-14 ENCOUNTER — Encounter: Payer: Self-pay | Admitting: Cardiology

## 2021-12-14 ENCOUNTER — Ambulatory Visit: Payer: Medicare HMO | Admitting: Cardiology

## 2021-12-14 VITALS — BP 114/72 | HR 70 | Ht 66.0 in | Wt 118.0 lb

## 2021-12-14 DIAGNOSIS — Z9889 Other specified postprocedural states: Secondary | ICD-10-CM

## 2021-12-14 DIAGNOSIS — Z9861 Coronary angioplasty status: Secondary | ICD-10-CM

## 2021-12-14 DIAGNOSIS — I482 Chronic atrial fibrillation, unspecified: Secondary | ICD-10-CM

## 2021-12-14 DIAGNOSIS — Z7901 Long term (current) use of anticoagulants: Secondary | ICD-10-CM | POA: Diagnosis not present

## 2021-12-14 DIAGNOSIS — E7849 Other hyperlipidemia: Secondary | ICD-10-CM

## 2021-12-14 DIAGNOSIS — I119 Hypertensive heart disease without heart failure: Secondary | ICD-10-CM | POA: Diagnosis not present

## 2021-12-14 DIAGNOSIS — Z79899 Other long term (current) drug therapy: Secondary | ICD-10-CM

## 2021-12-14 DIAGNOSIS — I2510 Atherosclerotic heart disease of native coronary artery without angina pectoris: Secondary | ICD-10-CM

## 2021-12-14 DIAGNOSIS — Z95818 Presence of other cardiac implants and grafts: Secondary | ICD-10-CM

## 2021-12-14 NOTE — Progress Notes (Signed)
?Cardiology Office Note:   ? ?Date:  12/14/2021  ? ?ID:  Teresa Hughes, DOB 1941-05-13, MRN [id number] ? ?PCP:  Nicoletta Dress, MD  ?Cardiologist:  Shirlee More, MD   ? ?Referring MD: Nicoletta Dress, MD  ? ? ?ASSESSMENT:   ? ?1. S/P mitral valve clip implantation   ?2. CAD S/P percutaneous coronary angioplasty   ?3. Chronic atrial fibrillation (HCC)   ?4. Chronic anticoagulation   ?5. High risk medication use   ?6. Hypertensive heart disease without heart failure   ?7. Other hyperlipidemia   ? ?PLAN:   ? ?In order of problems listed above: ? ?Overall [place] is doing well following multiple cardiac interventions including mitral valve clip with no residual mitral regurgitation clinically her CAD with percutaneous intervention without recurrent angina and rate control and anticoagulation for atrial fibrillation.  She is no longer on clopidogrel she takes very low-dose digoxin and tells me that every 6 months she has been level her PCP office: Less than 1.  We will continue her current medical regimen and plan to see her back in the office in 6 months ? ? ? ?Medication Adjustments/Labs and Tests Ordered: ?Current medicines are reviewed at length with the patient today.  Concerns regarding medicines are outlined above.  ?No orders of the defined types were placed in this encounter. ? ?No orders of the defined types were placed in this encounter. ? ? ?Chief Complaint  ?Patient presents with  ? Follow-up  ?With her mitral regurgitation CAD and atrial fibrillation ? ?History of Present Illness:   ? ?Teresa Hughes is a 81 y.o. female with a hx of severe symptomatic mitral regurgitation with P2 flail leaflet mitral valve prolapse of agitated repair MitraClip 01/20/2021.  Her course was complicated by hemopericardium requiring this pericardiocentesis and drain.  Other problems include CAD with PCI and stenting of the mid right coronary artery.  She also has mild calcific mitral stenosis mean gradient of 2 mmHg.   She was last seen 06/15/2021 New [place] Heart Association class I clopidogrel was discontinued and transitioned to single therapy with anticoagulant.  She remained on digoxin for rate control of her chronic atrial fibrillation. ?Compliance with diet, lifestyle and medications: Yes ? ?Her son is present participates in evaluation decision making and supervises his mother and medications at home ?Recent labs with her PCP cholesterol 137 LDL 58 at target A1c 5.9% hemoglobin 14.9 creatinine 1.23 potassium 4.2 ?Overall she feels well she is not doing heavy activities like housecleaning but she cares for self gets out of the home and is not having edema shortness of breath chest pain palpitation or syncope ?She tolerates her anticoagulant without bleeding complication in her lipid-lowering with rosuvastatin without muscle pain or weakness ?Past Medical History:  ?Diagnosis Date  ? Acute kidney injury ([place])   ? Anemia of chronic disease   ? Aneurysm ([place]) 07/01/2018  ? Anxiety   ? Asthma   ? Cerebral thrombosis with cerebral infarction ([place]) 05/07/2012  ? CHF (congestive heart failure) ([place])   ? Chronic anticoagulation 08/28/2016  ? Chronic atrial fibrillation ([place]) 08/28/2016  ? Chronic insomnia   ? Chronic renal disease   ? Coronary artery disease   ? DDD (degenerative disc disease), cervical   ? Dyspnea   ? Dysrhythmia   ? Esophageal stricture   ? GERD (gastroesophageal reflux disease)   ? Gout   ? Heart murmur   ? Hiatal hernia   ? High risk medication use 08/28/2016  ?  Hypercalcemia   ? Hypertension   ? Hypertensive heart disease without heart failure 08/28/2016  ? Late effects of CVA (cerebrovascular accident)   ? Macular degeneration, dry   ? Osteopenia   ? Osteoporosis   ? Other hyperlipidemia 08/28/2016  ? Paroxysmal SVT (supraventricular tachycardia) (HCC)   ? Pneumonia   ? S/P mitral valve clip implantation 01/20/2021  ? s/p TEER mitral valve repair using a MitraClip G4 XT W device, positioned A2 P2, reducing  mitral regurgitation from severe (4+) at baseline to mild (1+) post procedure  ? Sleep apnea   ? Vitamin D deficiency   ? ? ?Past Surgical History:  ?Procedure Laterality Date  ? CARDIAC CATHETERIZATION    ? CATARACT EXTRACTION Bilateral   ? Right 04/02/17, Left 9/22018  ? CORONARY ATHERECTOMY N/A 12/22/2020  ? Procedure: CORONARY ATHERECTOMY;  Surgeon: [name], MD;  Location: [facility];  Service: Cardiovascular;  Laterality: N/A;  ? CORONARY STENT INTERVENTION N/A 12/22/2020  ? Procedure: CORONARY STENT INTERVENTION;  Surgeon: [name], MD;  Location: [facility];  Service: Cardiovascular;  Laterality: N/A;  ? ERCP W/ SPHINCTEROTOMY AND BALLOON DILATION    ? EYE SURGERY    ? HEMORRHOID SURGERY  2016  ? MITRAL VALVE REPAIR N/A 01/20/2021  ? Procedure: MITRAL VALVE REPAIR;  Surgeon: [name], MD;  Location: [facility];  Service: Cardiovascular;  Laterality: N/A;  ? PERICARDIOCENTESIS N/A 01/20/2021  ? Procedure: PERICARDIOCENTESIS;  Surgeon: Jettie Booze, MD;  Location: [facility];  Service: Cardiovascular;  Laterality: N/A;  ? RIGHT/LEFT HEART CATH AND CORONARY ANGIOGRAPHY N/A 10/29/2020  ? Procedure: RIGHT/LEFT HEART CATH AND CORONARY ANGIOGRAPHY;  Surgeon: [name], MD;  Location: [facility];  Service: Cardiovascular;  Laterality: N/A;  ? TEE WITHOUT CARDIOVERSION N/A 09/14/2020  ? Procedure: TRANSESOPHAGEAL ECHOCARDIOGRAM (TEE);  Surgeon: Buford Dresser, MD;  Location: [facility];  Service: Cardiovascular;  Laterality: N/A;  ? TEE WITHOUT CARDIOVERSION N/A 01/20/2021  ? Procedure: TRANSESOPHAGEAL ECHOCARDIOGRAM (TEE);  Surgeon: [name], MD;  Location: [facility];  Service: Cardiovascular;  Laterality: N/A;  ? ? ?Current Medications: ?Current Meds  ?Medication Sig  ? alendronate (FOSAMAX) 70 MG tablet Take 70 mg by mouth every Sunday. Take with a full glass of water on an empty stomach.  ? allopurinol (ZYLOPRIM) 300 MG  tablet Take 300 mg by mouth at bedtime.  ? apixaban (ELIQUIS) 2.5 MG TABS tablet Take 1 tablet (2.5 mg total) by mouth 2 (two) times daily.  ? digoxin (LANOXIN) 0.125 MG tablet Take 0.5 tablets by mouth daily.  ? furosemide (LASIX) 40 MG tablet Take 40 mg by mouth daily.  ? mirtazapine (REMERON) 15 MG tablet Take 7.5 mg by mouth at bedtime.  ? omega-3 acid ethyl esters (LOVAZA) 1 g capsule Take 2 g by mouth 2 (two) times daily.  ? omeprazole (PRILOSEC) 20 MG capsule Take 20 mg by mouth daily.  ? pantoprazole (PROTONIX) 40 MG tablet Take 1 tablet (40 mg total) by mouth daily.  ? polyethylene glycol (MIRALAX / GLYCOLAX) 17 g packet Take 17 g by mouth daily.  ? potassium chloride SA (KLOR-CON M) 20 MEQ tablet Take 20 mEq by mouth 2 (two) times daily.  ? rosuvastatin (CRESTOR) 20 MG tablet Take 1 tablet (20 mg total) by mouth daily.  ?  ? ?Allergies:   Propranolol, Sulfamethoxazole, Codeine, and Latex  ? ?Social History  ? ?Socioeconomic History  ? Marital status: Unknown  ?  Spouse name: Not on file  ? Number of children: Not on file  ? Years of education: Not on file  ? Highest education level: Not on file  ?Occupational History  ? Not on file  ?Tobacco Use  ? Smoking status: Never  ? Smokeless tobacco: Current  ?  Types: Snuff  ?Vaping Use  ? Vaping Use: Never used  ?Substance and Sexual Activity  ? Alcohol use: Never  ? Drug use: Never  ? Sexual activity: Not on file  ?Other Topics Concern  ? Not on file  ?Social History Narrative  ? Not on file  ? ?Social Determinants of Health  ? ?Financial Resource Strain: Not on file  ?Food Insecurity: Not on file  ?Transportation Needs: Not on file  ?Physical Activity: Not on file  ?Stress: Not on file  ?Social Connections: Not on file  ?  ? ?Family History: ?The patient's family history includes Aneurysm in her maternal grandmother; Congestive Heart Failure in her father; Diabetes in her mother; Heart attack in her brother, father, and sister; Heart disease in her brother,  father, maternal grandfather, and maternal grandmother; Hypertension in her brother and mother; Skin cancer in her brother; Stroke in her maternal grandfather and mother. ?ROS:   ?Please see the history of present i

## 2021-12-14 NOTE — Patient Instructions (Signed)

## 2021-12-27 DIAGNOSIS — D234 Other benign neoplasm of skin of scalp and neck: Secondary | ICD-10-CM | POA: Diagnosis not present

## 2022-01-06 NOTE — Progress Notes (Unsigned)
HE[facility] AND [place]                                     Cardiology Office Note:    Date:  01/11/2022   ID:  Teresa Hughes, DOB 1941-04-06, MRN [id number]  PCP:  Nicoletta Dress, MD  [facility] Cardiologist:  Shirlee More, MD  [facility] Electrophysiologist:  None   Referring MD: Nicoletta Dress, MD   Chief Complaint  Patient presents with   Follow-up    1 year s/p TEER   History of Present Illness:    Teresa Hughes is a 81 y.o. female with a hx of severe multivessel CAD s/p atherectomy and PCI of mRCA (12/22/20), longstanding persistent atrial fibrillation on long-term Eliquis, embolic stroke, chronic diastolic congestive heart failure, chronic kidney disease, degenerative arthritis with chronic back pain, esophageal stricture, GE reflux disease, macular degeneration, hiatal hernia, and mitral valve prolapse with mitral regurgitation s/p TEER (01/20/21) c/b hemopericardium with tamponade s/p emergent pericardiocentesis who presents for one year follow up.   [name] has had a know heart murmur since she was young. She has long history of shortness of breath without chest discomfort dating back at least 5 or 6 years but which progressed substantially over 6 to 12 months prior to TEER. Follow-up echocardiogram performed 06/2020 revealed normal left ventricular systolic function with ejection fraction estimated 60 to 65%. There was mitral valve prolapse with moderate to severe mitral regurgitation.  TEE was recommended and performed 09/14/20 which confirmed the presence of mitral valve prolapse with severe mitral regurgitation. There was low normal left ventricular function with ejection fraction estimated only 50 to 55% in the setting of severe mitral regurgitation with severe holosystolic prolapse involving a portion of the middle scallop of the posterior leaflet. She was referred to the multidisciplinary heart valve clinic and has  been evaluated previously by Dr. Burt Knack who discussed the potential role of transcatheter edge-to-edge repair. Diagnostic cardiac catheterization was performed 10/29/2020.  Catheterization revealed severe multivessel coronary artery disease including long segment severe stenosis of the mid right coronary artery and 75% proximal stenosis of the left anterior descending coronary artery with mild nonobstructive disease in the left circumflex territory. Right heart pressures were very mildly elevated. She later underwent successful atherectomy and stenting of severe, calcified, complex stenoses in the mid RCA using balloon angioplasty, orbital atherectomy, and stenting on 12/22/20. She was treated with aspirin x1 week, Plavix x 6 months and resumed on Eliquis 2.'5mg'$  BID.    She was evaluated by the multidisciplinary valve team and underwent successful edge-to-edge repair with MitraClip with good MR reduction. Post operative course was c/b hemopericardium with tamponade s/p emergent pericardiocentesis with drain placement. Minimal residual effusion seen on POD1 echo and resolved on limited echo 6/12. POD1 echo showed trivial residual MR and a mean gradient of 3 mm hg. She was continued on clopidogrel after PCI and resumed on home Eliquis 48 hours out from hemopericardium. Neurology consulted an CT head showed prior R MCA stroke in 2018 and prior R insular infarct but no acute findings.    At one week follow up her ECG was abnormal showing some ST-T wave abnormalities in the lateral leads which was reviewed this with Dr. Burt Knack who did not feel like any further work-up was indicated given her lack of symptoms and stable limited echo that day.  Additionally, her HR was elevated and lopressor was added back.   On one month follow up, she continued to have SOB and fatigue felt to be related to poor appetite and weight loss. Since that time, she has been followed by her PCP who added megace at one point to increase her  appetite. She has re-gained some weight and feels much better. Her appetite has improved. She denies SOB, chest pain, palpitations, LE edema, orthopnea, dizziness, or syncope.   Past Medical History:  Diagnosis Date   Acute kidney injury ([place])    Anemia of chronic disease    Aneurysm ([place]) 07/01/2018   Anxiety    Asthma    Cerebral thrombosis with cerebral infarction ([place]) 05/07/2012   CHF (congestive heart failure) (HCC)    Chronic anticoagulation 08/28/2016   Chronic atrial fibrillation ([place]) 08/28/2016   Chronic insomnia    Chronic renal disease    Coronary artery disease    DDD (degenerative disc disease), cervical    Dyspnea    Dysrhythmia    Esophageal stricture    GERD (gastroesophageal reflux disease)    Gout    Heart murmur    Hiatal hernia    High risk medication use 08/28/2016   Hypercalcemia    Hypertension    Hypertensive heart disease without heart failure 08/28/2016   Late effects of CVA (cerebrovascular accident)    Macular degeneration, dry    Osteopenia    Osteoporosis    Other hyperlipidemia 08/28/2016   Paroxysmal SVT (supraventricular tachycardia) (HCC)    Pneumonia    S/P mitral valve clip implantation 01/20/2021   s/p TEER mitral valve repair using a MitraClip G4 XT W device, positioned A2 P2, reducing mitral regurgitation from severe (4+) at baseline to mild (1+) post procedure   Sleep apnea    Vitamin D deficiency     Past Surgical History:  Procedure Laterality Date   CARDIAC CATHETERIZATION     CATARACT EXTRACTION Bilateral    Right 04/02/17, Left 9/22018   CORONARY ATHERECTOMY N/A 12/22/2020   Procedure: CORONARY ATHERECTOMY;  Surgeon: [name], MD;  Location: [place] [facility];  Service: Cardiovascular;  Laterality: N/A;   CORONARY STENT INTERVENTION N/A 12/22/2020   Procedure: CORONARY STENT INTERVENTION;  Surgeon: [name], MD;  Location: [place] [facility];  Service: Cardiovascular;  Laterality: N/A;   ERCP W/  SPHINCTEROTOMY AND BALLOON DILATION     EYE SURGERY     HEMORRHOID SURGERY  2016   MITRAL VALVE REPAIR N/A 01/20/2021   Procedure: MITRAL VALVE REPAIR;  Surgeon: [name], MD;  Location: [place] [facility];  Service: Cardiovascular;  Laterality: N/A;   PERICARDIOCENTESIS N/A 01/20/2021   Procedure: PERICARDIOCENTESIS;  Surgeon: Jettie Booze, MD;  Location: [place] [facility];  Service: Cardiovascular;  Laterality: N/A;   RIGHT/LEFT HEART CATH AND CORONARY ANGIOGRAPHY N/A 10/29/2020   Procedure: RIGHT/LEFT HEART CATH AND CORONARY ANGIOGRAPHY;  Surgeon: [name], MD;  Location: [place] [facility];  Service: Cardiovascular;  Laterality: N/A;   TEE WITHOUT CARDIOVERSION N/A 09/14/2020   Procedure: TRANSESOPHAGEAL ECHOCARDIOGRAM (TEE);  Surgeon: Buford Dresser, MD;  [facility];  Service: Cardiovascular;  Laterality: N/A;   TEE WITHOUT CARDIOVERSION N/A 01/20/2021   Procedure: TRANSESOPHAGEAL ECHOCARDIOGRAM (TEE);  Surgeon: [name], MD;  Location: [place] [facility];  Service: Cardiovascular;  Laterality: N/A;    Current Medications: Current Meds  Medication Sig   acetaminophen (TYLENOL) 650 MG CR tablet Take 650 mg by mouth every  8 (eight) hours as needed for pain or fever (arthritis).   alendronate (FOSAMAX) 70 MG tablet Take 70 mg by mouth every Sunday. Take with a full glass of water on an empty stomach.   allopurinol (ZYLOPRIM) 300 MG tablet Take 300 mg by mouth at bedtime.   apixaban (ELIQUIS) 2.5 MG TABS tablet Take 1 tablet (2.5 mg total) by mouth 2 (two) times daily.   digoxin (LANOXIN) 0.125 MG tablet Take 0.5 tablets by mouth daily.   furosemide (LASIX) 40 MG tablet Take 40 mg by mouth daily.   ketoconazole (NIZORAL) 2 % shampoo 2 (two) times a week.   metoprolol tartrate (LOPRESSOR) 50 MG tablet Take 50 mg by mouth 2 (two) times daily.   mirtazapine (REMERON) 15 MG tablet Take 7.5 mg by mouth at bedtime.   omega-3 acid ethyl esters (LOVAZA) 1  g capsule Take 2 g by mouth 2 (two) times daily.   omeprazole (PRILOSEC) 20 MG capsule Take 20 mg by mouth daily.   pantoprazole (PROTONIX) 40 MG tablet Take 1 tablet (40 mg total) by mouth daily. (Patient taking differently: Take 40 mg by mouth as needed.)   polyethylene glycol (MIRALAX / GLYCOLAX) 17 g packet Take 17 g by mouth daily.   potassium chloride SA (KLOR-CON M) 20 MEQ tablet Take 20 mEq by mouth 2 (two) times daily.   Turmeric (QC TUMERIC COMPLEX) 500 MG CAPS Take by mouth daily.     Allergies:   Propranolol, Sulfamethoxazole, Codeine, and Latex   Social History   Socioeconomic History   Marital status: Unknown    Spouse name: Not on file   Number of children: Not on file   Years of education: Not on file   Highest education level: Not on file  Occupational History   Not on file  Tobacco Use   Smoking status: Never   Smokeless tobacco: Current    Types: Snuff  Vaping Use   Vaping Use: Never used  Substance and Sexual Activity   Alcohol use: Never   Drug use: Never   Sexual activity: Not on file  Other Topics Concern   Not on file  Social History Narrative   Not on file   Social Determinants of Health   Financial Resource Strain: Not on file  Food Insecurity: Not on file  Transportation Needs: Not on file  Physical Activity: Not on file  Stress: Not on file  Social Connections: Not on file     Family History: The patient's family history includes Aneurysm in her maternal grandmother; Congestive Heart Failure in her father; Diabetes in her mother; Heart attack in her brother, father, and sister; Heart disease in her brother, father, maternal grandfather, and maternal grandmother; Hypertension in her brother and mother; Skin cancer in her brother; Stroke in her maternal grandfather and mother.  ROS:   Please see the history of present illness.    All other systems reviewed and are negative.  EKGs/Labs/Other Studies Reviewed:    The following studies were  reviewed today:  01/20/21 Conclusion   Successful transcatheter edge-to-edge mitral valve repair using a MitraClip G4 XT W device, positioned A2 P2, reducing mitral regurgitation from severe (4+) at baseline to mild (1+) post procedure.   Recommendations   Antiplatelet/Anticoag Recommend to resume Apixaban, at currently prescribed dose and frequency on 01/20/2021. Recommend concurrent antiplatelet therapy of Clopidogrel '75mg'$  daily for 3 months.    _____________     Echo 01/21/21 IMPRESSIONS   1. Left ventricular ejection fraction, by  estimation, is 60 to 65%. The  left ventricle has normal function. The left ventricle has no regional  wall motion abnormalities. There is severe left ventricular hypertrophy of  the basal-septal segment. Left  ventricular diastolic function could not be evaluated. Elevated left  ventricular end-diastolic pressure.   2. Right ventricular systolic function is normal. The right ventricular  size is normal. Tricuspid regurgitation signal is inadequate for assessing  PA pressure.   3. Left atrial size was severely dilated.   4. Right atrial size was mildly dilated.   5. The mitral valve has been repaired/replaced. There is a Mitra-Clip  present in the mitral position. Trivial mitral valve regurgitation. No  evidence of mitral stenosis. The mean mitral valve gradient is 3.0 mmHg.   6. The aortic valve was not well visualized. Aortic valve regurgitation  is mild. Mild aortic valve sclerosis is present, with no evidence of  aortic valve stenosis. Aortic regurgitation PHT measures 522 msec. Aortic  valve area, by VTI measures 2.49 cm.  Aortic valve mean gradient measures 3.0 mmHg. Aortic valve Vmax measures  1.19 m/s.   7. Aortic dilatation noted. There is mild dilatation of the aortic root,  measuring 40 mm.   8. Evidence of atrial level shunting detected by color flow Doppler.  There is a small patent foramen ovale.    __________________   Echo  01/23/21 IMPRESSIONS   1. Left ventricular ejection fraction, by estimation, is 55 to 60%. The  left ventricle has normal function. Left ventricular endocardial border  not optimally defined to evaluate regional wall motion. Left ventricular  diastolic function could not be  evaluated.   2. The mitral valve has been repaired/replaced. There is a Mitra-Clip  present in the mitral position. Trivial mitral valve regurgitation.   3. The aortic valve is tricuspid. Aortic valve regurgitation is moderate.  Mild to moderate aortic valve sclerosis/calcification is present, without  any evidence of aortic stenosis.   4. No pericardial effusion noted.    ___________________   Limited echo 01/27/21 IMPRESSIONS  1. Limited study with all views not obtained and doppler incomplete. Pt in atrial fibrillation during the study; s/p mitraclip with no MS (mean gradient 4 mmHg) and trace MR; PFO noted.  2. Left ventricular ejection fraction, by estimation, is 65 to 70%. The left ventricle has normal function. There is mild left ventricular hypertrophy. Left ventricular diastolic function could not be evaluated.  3. Right ventricular systolic function is normal. The right ventricular size is normal.  4. Left atrial size was moderately dilated.  5. Trivial mitral valve regurgitation. No evidence of mitral stenosis. There is a Mitra-Clip present in the mitral position.  6. The aortic valve was not well visualized. Aortic valve regurgitation is mild.  7. Aortic dilatation noted. There is mild dilatation of the aortic root, measuring 40 mm.  8. The inferior vena cava is normal in size with greater than 50% respiratory variability, suggesting right atrial pressure of 3 mmHg.   ____________________   Echo 03/02/21 IMPRESSIONS   1. Left ventricular ejection fraction, by estimation, is 50 to 55%. The  left ventricle has low normal function. The left ventricle has no regional  wall motion abnormalities. There is  moderate asymmetric left ventricular  hypertrophy of the basal-septal segment. Left ventricular diastolic function could not be evaluated.   2. Right ventricular systolic function is normal. The right ventricular  size is normal. There is normal pulmonary artery systolic pressure.   3. Left atrial size was  moderately dilated.   4. Right atrial size was mildly dilated.   5. The mitral valve has been repaired/replaced. Trivial mitral valve  regurgitation. The mean mitral valve gradient is 2.0 mmHg. There is a  Mitra-Clip present in the mitral position. Procedure Date: 01/17/2021.   6. The aortic valve is tricuspid. There is mild calcification of the  aortic valve. There is mild thickening of the aortic valve. Aortic valve  regurgitation is mild. Mild aortic valve sclerosis is present, with no  evidence of aortic valve stenosis.  Aortic regurgitation PHT measures 386 msec.   7. Aortic dilatation noted. There is borderline dilatation of the  ascending aorta, measuring 37 mm.   8. The inferior vena cava is normal in size with greater than 50%  respiratory variability, suggesting right atrial pressure of 3 mmHg.   9. Evidence of atrial level shunting detected by color flow Doppler.  There is a small patent foramen ovale with predominantly left to right  shunting across the atrial septum.   Comparison(s): Prior images reviewed side by side. Changes from prior  study are noted. EF slightly reduced/frequent PVCs.   Conclusion(s)/Recommendation(s): Frequent and occasionally sequential PVCs noted throughout study, which impacts EF and wall motion evaluation.    EKG:  EKG is not ordered today.    Recent Labs: 01/21/2021: ALT 18; B Natriuretic Peptide 259.3 01/22/2021: Magnesium 2.2 03/14/2021: BUN 14; Creatinine, Ser 1.13; Hemoglobin 12.8; NT-Pro BNP 1,136; Platelets 229; Potassium 4.2; Sodium 143  Recent Lipid Panel No results found for: CHOL, TRIG, HDL, CHOLHDL, VLDL, LDLCALC,  LDLDIRECT  Physical Exam:    VS:  BP 130/80   Pulse (!) 57   Ht '5\' 6"'$  (1.676 m)   Wt 118 lb 9.6 oz (53.8 kg)   SpO2 98%   BMI 19.14 kg/m     Wt Readings from Last 3 Encounters:  01/11/22 118 lb 9.6 oz (53.8 kg)  12/14/21 118 lb (53.5 kg)  06/15/21 112 lb 12.8 oz (51.2 kg)   General: Well developed, well nourished, NAD Lungs:Clear to ausculation bilaterally. No wheezes, rales, or rhonchi. Breathing is unlabored. Cardiovascular: Irregularly irregular. Soft systolic murmurs Extremities: No edema.  Neuro: Alert and oriented. No focal deficits. No facial asymmetry. MAE spontaneously. Psych: Responds to questions appropriately with normal affect.    ASSESSMENT/PLAN:    Severe non-rheumatic MR s/p TEER: One month echo with EF at 50%, normally functioning mitral valve TEER with trivial residual MR and a mean gradient of 63mHg. She has NYHA class I symptoms and is doing very well. Continue lifelong SBE prophylaxis. Continue Eliquis only. She is one year out from PCI and is no longer on Plavix.  Plan follow up with Dr. MBettina Gavia10/2023.    CAD s/p recent PCI: Denies anginal symptoms. No longer on ASA or Plavix given duration since intervention. Continue Eliquis monotherapy.    Persistent atrial fibrillation: Stable with no c/o palpitations. Continue Lopressor '50mg'$  BID. Continue on Eliquis 2.'5mg'$  BID.    Anorexia with weight loss: Resolved. Weight has stabilized. Feels better with no further fatigue.     Medication Adjustments/Labs and Tests Ordered: Current medicines are reviewed at length with the patient today.  Concerns regarding medicines are outlined above.  No orders of the defined types were placed in this encounter.  No orders of the defined types were placed in this encounter.    Patient Instructions  Medication Instructions:  Your physician recommends that you continue on your current medications as directed. Please refer to the Current  Medication list given to you  today.  *If you need a refill on your cardiac medications before your next appointment, please call your pharmacy*   Lab Work: NONE If you have labs (blood work) drawn today and your tests are completely normal, you will receive your results only by: [place] (if you have MyChart) OR A paper copy in the mail If you have any lab test that is abnormal or we need to change your treatment, we will call you to review the results.   Testing/Procedures: NONE   Follow-Up: At [facility], you and your health needs are our priority.  As part of our continuing mission to provide you with exceptional heart care, we have created designated Provider Care Teams.  These Care Teams include your primary Cardiologist (physician) and Advanced Practice Providers (APPs -  Physician Assistants and Nurse Practitioners) who all work together to provide you with the care you need, when you need it.  We recommend signing up for the patient portal called "MyChart".  Sign up information is provided on this After Visit Summary.  MyChart is used to connect with patients for Virtual Visits (Telemedicine).  Patients are able to view lab/test results, encounter notes, upcoming appointments, etc.  Non-urgent messages can be sent to your provider as well.   To learn more about what you can do with MyChart, go to [email or web address].    Shirlee More, MD    Important Information About Sugar         Signed, Kathyrn Drown, NP  01/11/2022 9:57 AM    [place]

## 2022-01-11 ENCOUNTER — Ambulatory Visit (HOSPITAL_COMMUNITY): Payer: Medicare HMO | Attending: Cardiology

## 2022-01-11 ENCOUNTER — Ambulatory Visit: Payer: Medicare HMO | Admitting: Cardiology

## 2022-01-11 VITALS — BP 130/80 | HR 57 | Ht 66.0 in | Wt 118.6 lb

## 2022-01-11 DIAGNOSIS — Z95818 Presence of other cardiac implants and grafts: Secondary | ICD-10-CM

## 2022-01-11 DIAGNOSIS — Z9861 Coronary angioplasty status: Secondary | ICD-10-CM | POA: Diagnosis not present

## 2022-01-11 DIAGNOSIS — Z7901 Long term (current) use of anticoagulants: Secondary | ICD-10-CM | POA: Diagnosis not present

## 2022-01-11 DIAGNOSIS — I340 Nonrheumatic mitral (valve) insufficiency: Secondary | ICD-10-CM | POA: Insufficient documentation

## 2022-01-11 DIAGNOSIS — I482 Chronic atrial fibrillation, unspecified: Secondary | ICD-10-CM

## 2022-01-11 DIAGNOSIS — I2510 Atherosclerotic heart disease of native coronary artery without angina pectoris: Secondary | ICD-10-CM

## 2022-01-11 DIAGNOSIS — Z9889 Other specified postprocedural states: Secondary | ICD-10-CM | POA: Diagnosis not present

## 2022-01-11 LAB — ECHOCARDIOGRAM COMPLETE
AR max vel: 2.49 cm2
AV Area VTI: 2.36 cm2
AV Area mean vel: 2.45 cm2
AV Mean grad: 4 mmHg
AV Peak grad: 6.5 mmHg
Ao pk vel: 1.27 m/s
Area-P 1/2: 2.03 cm2
MV M vel: 6.55 m/s
MV Peak grad: 171.6 mmHg
P 1/2 time: 862 msec
S' Lateral: 2.7 cm

## 2022-01-11 NOTE — Patient Instructions (Signed)
Medication Instructions:  Your physician recommends that you continue on your current medications as directed. Please refer to the Current Medication list given to you today.  *If you need a refill on your cardiac medications before your next appointment, please call your pharmacy*   Lab Work: NONE If you have labs (blood work) drawn today and your tests are completely normal, you will receive your results only by: [place] (if you have MyChart) OR A paper copy in the mail If you have any lab test that is abnormal or we need to change your treatment, we will call you to review the results.   Testing/Procedures: NONE   Follow-Up: At [facility], you and your health needs are our priority.  As part of our continuing mission to provide you with exceptional heart care, we have created designated Provider Care Teams.  These Care Teams include your primary Cardiologist (physician) and Advanced Practice Providers (APPs -  Physician Assistants and Nurse Practitioners) who all work together to provide you with the care you need, when you need it.  Your next appointment:   5 month(s)  The format for your next appointment:   In Person  Provider:   Shirlee More, MD    Important Information About Sugar

## 2022-03-13 DIAGNOSIS — H4322 Crystalline deposits in vitreous body, left eye: Secondary | ICD-10-CM | POA: Diagnosis not present

## 2022-03-13 DIAGNOSIS — I5032 Chronic diastolic (congestive) heart failure: Secondary | ICD-10-CM | POA: Diagnosis not present

## 2022-03-13 DIAGNOSIS — N183 Chronic kidney disease, stage 3 unspecified: Secondary | ICD-10-CM | POA: Diagnosis not present

## 2022-03-13 DIAGNOSIS — H31113 Age-related choroidal atrophy, bilateral: Secondary | ICD-10-CM | POA: Diagnosis not present

## 2022-03-13 DIAGNOSIS — Z79899 Other long term (current) drug therapy: Secondary | ICD-10-CM | POA: Diagnosis not present

## 2022-03-13 DIAGNOSIS — R7301 Impaired fasting glucose: Secondary | ICD-10-CM | POA: Diagnosis not present

## 2022-03-13 DIAGNOSIS — I480 Paroxysmal atrial fibrillation: Secondary | ICD-10-CM | POA: Diagnosis not present

## 2022-03-13 DIAGNOSIS — D638 Anemia in other chronic diseases classified elsewhere: Secondary | ICD-10-CM | POA: Diagnosis not present

## 2022-03-13 DIAGNOSIS — H43393 Other vitreous opacities, bilateral: Secondary | ICD-10-CM | POA: Diagnosis not present

## 2022-03-13 DIAGNOSIS — Z961 Presence of intraocular lens: Secondary | ICD-10-CM | POA: Diagnosis not present

## 2022-03-13 DIAGNOSIS — G8194 Hemiplegia, unspecified affecting left nondominant side: Secondary | ICD-10-CM | POA: Diagnosis not present

## 2022-03-13 DIAGNOSIS — E785 Hyperlipidemia, unspecified: Secondary | ICD-10-CM | POA: Diagnosis not present

## 2022-03-13 DIAGNOSIS — M109 Gout, unspecified: Secondary | ICD-10-CM | POA: Diagnosis not present

## 2022-03-13 DIAGNOSIS — H353132 Nonexudative age-related macular degeneration, bilateral, intermediate dry stage: Secondary | ICD-10-CM | POA: Diagnosis not present

## 2022-03-13 DIAGNOSIS — D3132 Benign neoplasm of left choroid: Secondary | ICD-10-CM | POA: Diagnosis not present

## 2022-03-13 DIAGNOSIS — H524 Presbyopia: Secondary | ICD-10-CM | POA: Diagnosis not present

## 2022-03-13 DIAGNOSIS — I10 Essential (primary) hypertension: Secondary | ICD-10-CM | POA: Diagnosis not present

## 2022-05-04 ENCOUNTER — Encounter (INDEPENDENT_AMBULATORY_CARE_PROVIDER_SITE_OTHER): Payer: Medicare HMO | Admitting: Ophthalmology

## 2022-05-04 DIAGNOSIS — H353112 Nonexudative age-related macular degeneration, right eye, intermediate dry stage: Secondary | ICD-10-CM

## 2022-05-04 DIAGNOSIS — H43813 Vitreous degeneration, bilateral: Secondary | ICD-10-CM | POA: Diagnosis not present

## 2022-05-04 DIAGNOSIS — H348312 Tributary (branch) retinal vein occlusion, right eye, stable: Secondary | ICD-10-CM | POA: Diagnosis not present

## 2022-05-04 DIAGNOSIS — I10 Essential (primary) hypertension: Secondary | ICD-10-CM

## 2022-05-04 DIAGNOSIS — H35033 Hypertensive retinopathy, bilateral: Secondary | ICD-10-CM

## 2022-05-04 DIAGNOSIS — H353221 Exudative age-related macular degeneration, left eye, with active choroidal neovascularization: Secondary | ICD-10-CM

## 2022-05-08 IMAGING — CT CT HEAD W/O CM
4 series · 16 of 47 positions shown, 18 images · non-contrast
Comparison: 08/27/2016

CLINICAL DATA: Hypotension and unresponsiveness.

EXAM:
CT HEAD WITHOUT CONTRAST
TECHNIQUE: Contiguous axial images were obtained from the base of the skull
through the vertex without intravenous contrast.

[Series 3: head wo · axial · 0.42mm/px · z∈[+1240,+1360]mm · 7 of 32 slices shown, 9 images]
[im 4/32  brain]
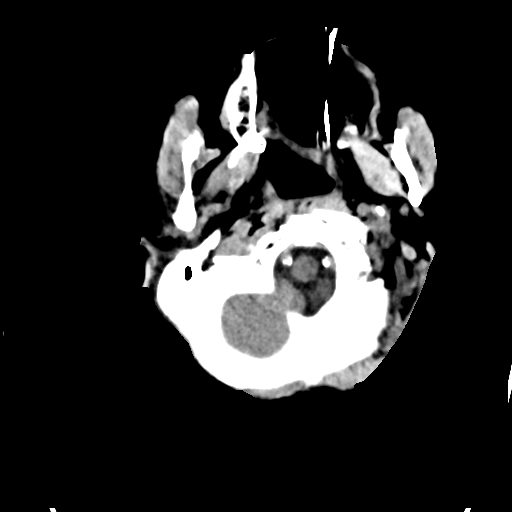
[im 4/32  bone]
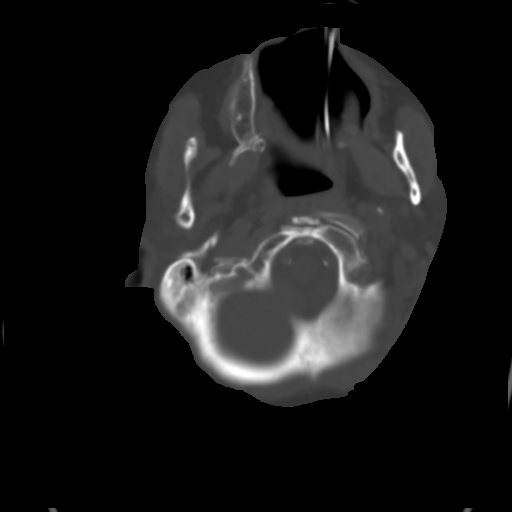
[im 8/32  brain]
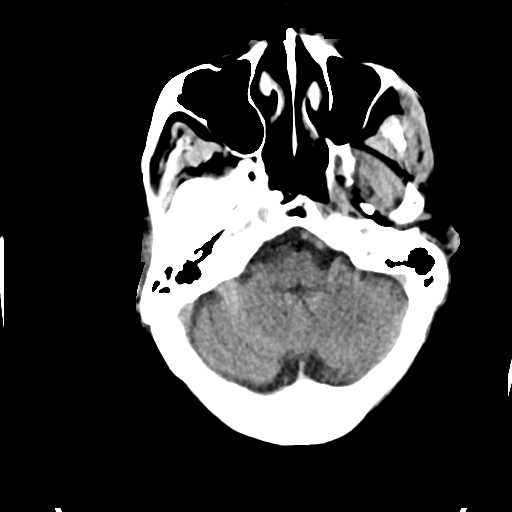
[im 12/32  brain]
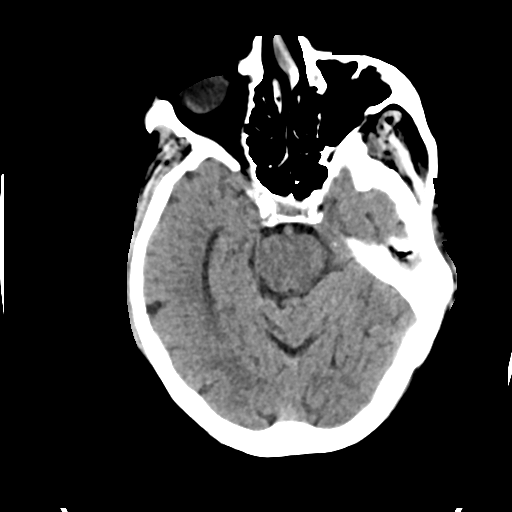
[im 16/32  brain]
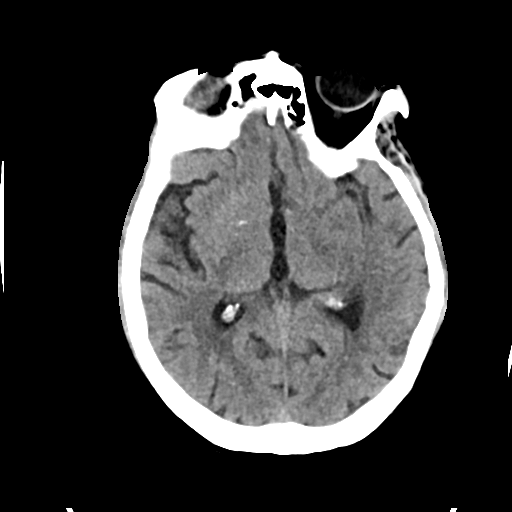
[im 20/32  brain]
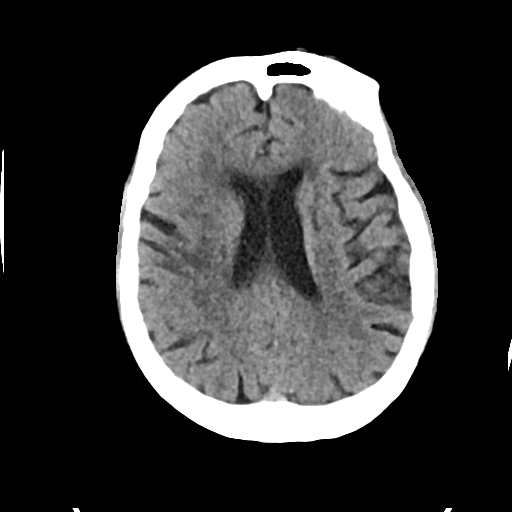
[im 20/32  bone]
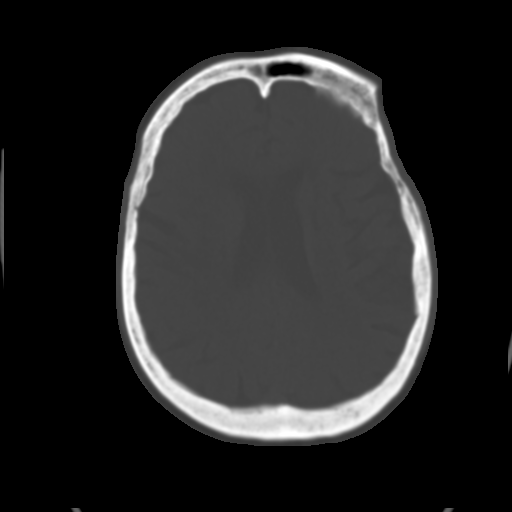
[im 24/32  brain]
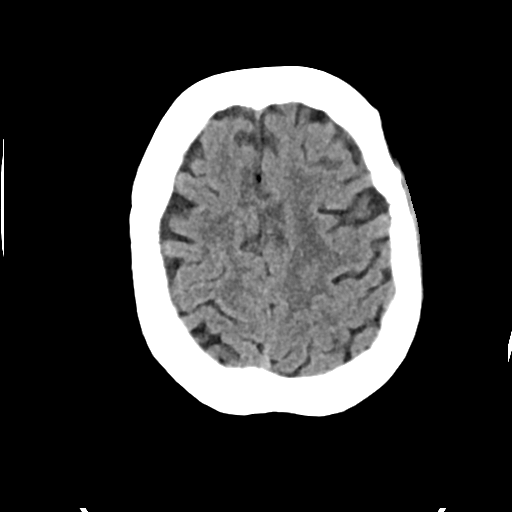
[im 28/32  brain]
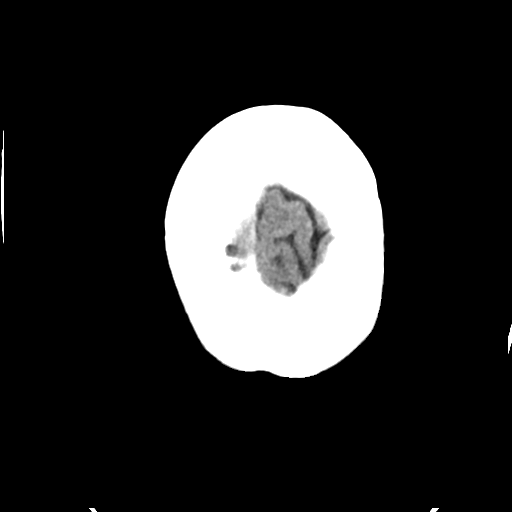

[Series 4: head bone · axial · 0.42mm/px · z∈[+1239,+1271]mm · 3 of 79 slices shown]
[im 8/79  bone]
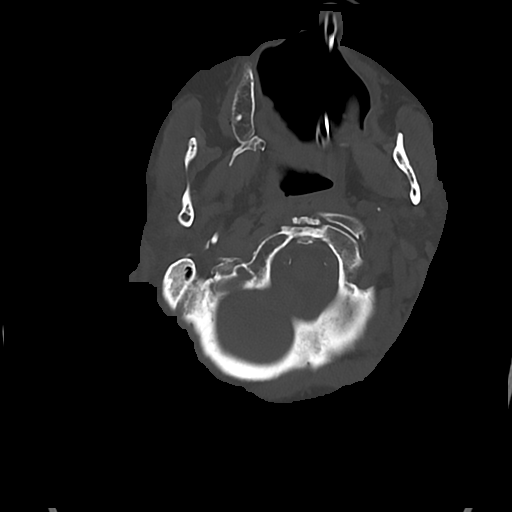
[im 16/79  bone]
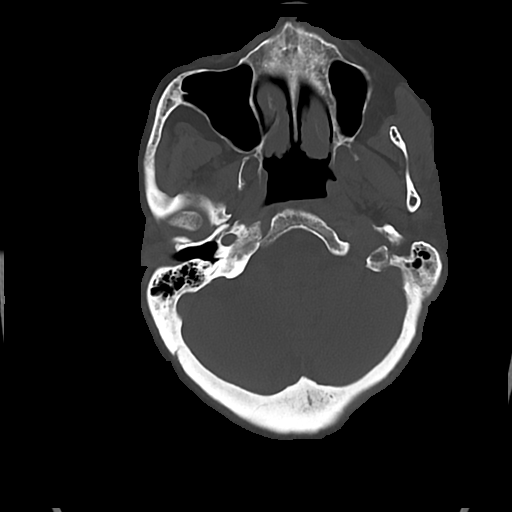
[im 24/79  bone]
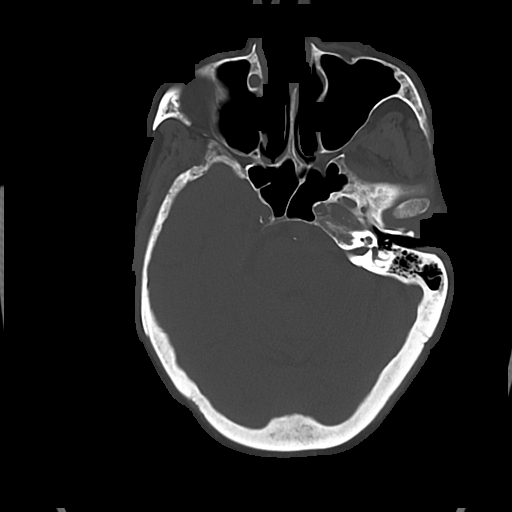

[Series 5: cor soft · coronal · 0.32mm/px · 3 of 62 slices shown]
[im 21/62  brain]
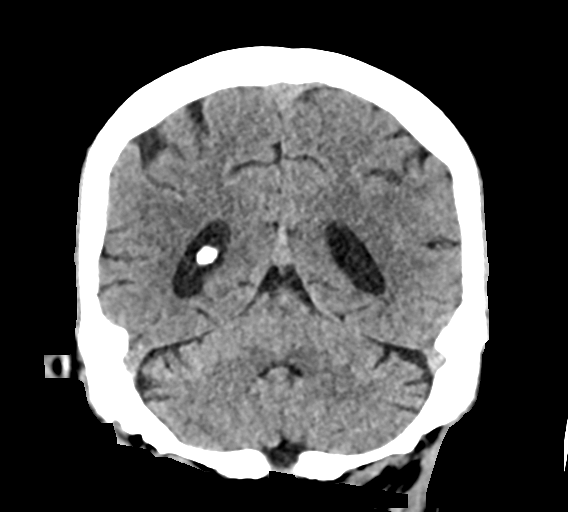
[im 28/62  brain]
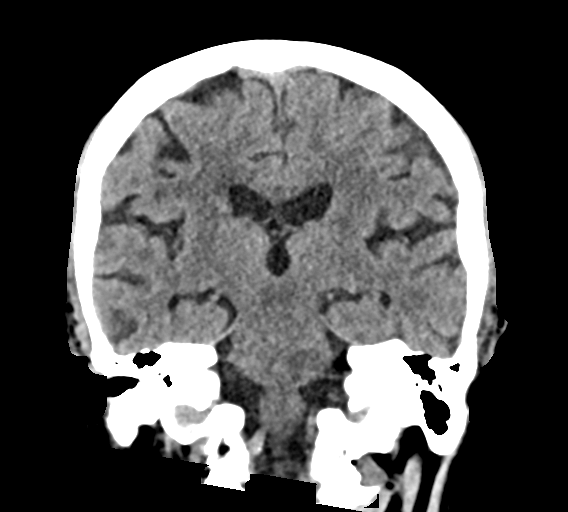
[im 34/62  brain]
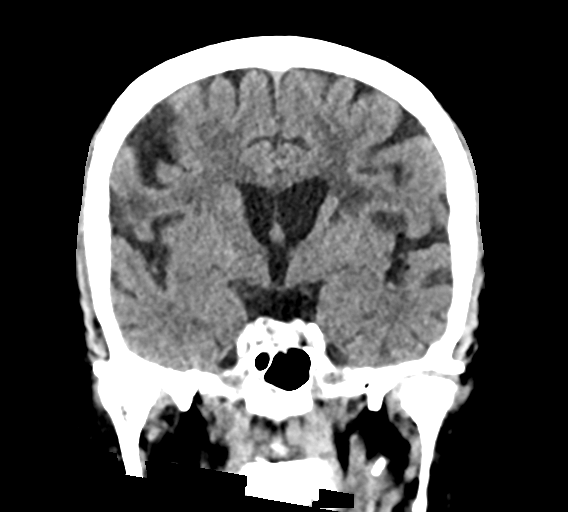

[Series 6: sag soft · sagittal · 0.34mm/px · 3 of 51 slices shown]
[im 19/51  brain]
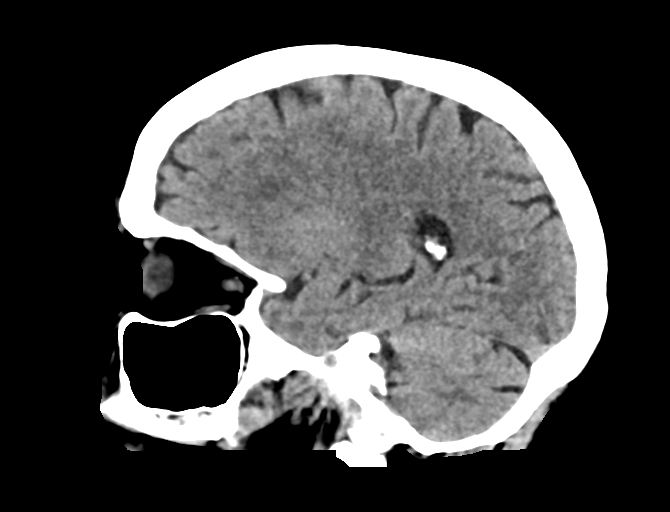
[im 26/51  brain]
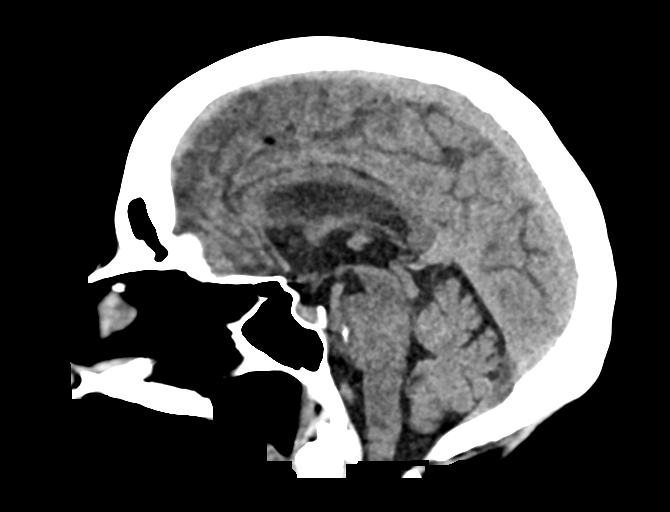
[im 32/51  brain]
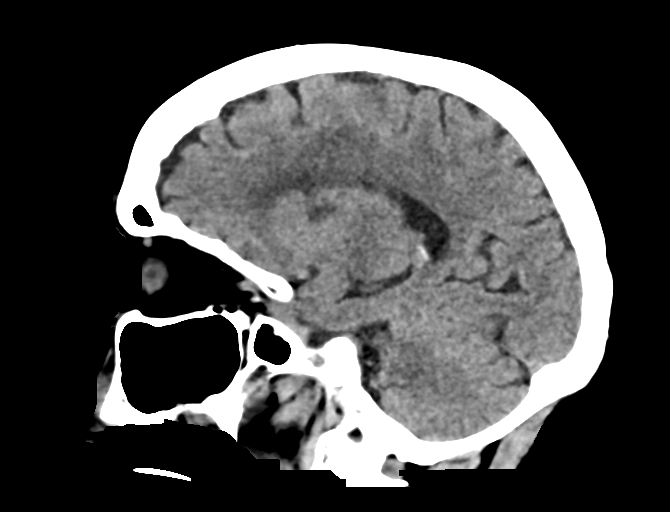

[16 of 47 positions shown; findings below may reference images not displayed]

FINDINGS: Brain: Age related atrophy. No focal abnormality seen affecting the
brainstem or cerebellum. Chronic small-vessel ischemic changes are
present throughout the cerebral hemispheric white matter. Old right
insular region infarction. No sign of acute infarction, mass,
hemorrhage, hydrocephalus or extra-axial collection.

Vascular: There is atherosclerotic calcification of the major
vessels at the base of the brain.

Skull: Negative

Sinuses/Orbits: Clear/normal

Other: None
IMPRESSION: No acute finding by CT. Atrophy and extensive chronic small-vessel
ischemic changes. Old right insular region infarction.

## 2022-06-13 DIAGNOSIS — D638 Anemia in other chronic diseases classified elsewhere: Secondary | ICD-10-CM | POA: Diagnosis not present

## 2022-06-13 DIAGNOSIS — I480 Paroxysmal atrial fibrillation: Secondary | ICD-10-CM | POA: Diagnosis not present

## 2022-06-13 DIAGNOSIS — Z79899 Other long term (current) drug therapy: Secondary | ICD-10-CM | POA: Diagnosis not present

## 2022-06-13 DIAGNOSIS — E785 Hyperlipidemia, unspecified: Secondary | ICD-10-CM | POA: Diagnosis not present

## 2022-06-13 DIAGNOSIS — I5032 Chronic diastolic (congestive) heart failure: Secondary | ICD-10-CM | POA: Diagnosis not present

## 2022-06-13 DIAGNOSIS — M109 Gout, unspecified: Secondary | ICD-10-CM | POA: Diagnosis not present

## 2022-06-13 DIAGNOSIS — R7301 Impaired fasting glucose: Secondary | ICD-10-CM | POA: Diagnosis not present

## 2022-06-13 DIAGNOSIS — N183 Chronic kidney disease, stage 3 unspecified: Secondary | ICD-10-CM | POA: Diagnosis not present

## 2022-06-13 DIAGNOSIS — Z23 Encounter for immunization: Secondary | ICD-10-CM | POA: Diagnosis not present

## 2022-06-13 DIAGNOSIS — G8194 Hemiplegia, unspecified affecting left nondominant side: Secondary | ICD-10-CM | POA: Diagnosis not present

## 2022-06-13 DIAGNOSIS — Z139 Encounter for screening, unspecified: Secondary | ICD-10-CM | POA: Diagnosis not present

## 2022-06-13 DIAGNOSIS — I10 Essential (primary) hypertension: Secondary | ICD-10-CM | POA: Diagnosis not present

## 2022-08-15 NOTE — Progress Notes (Unsigned)
Cardiology Office Note:    Date:  08/16/2022   ID:  Teresa Hughes, DOB [birth year], MRN [id number]  PCP:  Nicoletta Dress, MD  Cardiologist:  Shirlee More, MD    Referring MD: Nicoletta Dress, MD please to be sure to check digoxin level and need your office labs by goals of for the less than 1.0 to avoid the excess mortality   ASSESSMENT:    1. S/P mitral valve clip implantation   2. CAD S/P percutaneous coronary angioplasty   3. Chronic atrial fibrillation ([place])   4. Chronic anticoagulation   5. High risk medication use   6. Hypertensive heart disease without heart failure   7. Other hyperlipidemia    PLAN:    In order of problems listed above:    From my perspective the patient she has done well since MitraClip with good durable response no evidence of heart failure asymptomatic atrial fibrillation rate controlled and blood pressure is at target Stable CAD having no anginal discomfort Rate controlled atrial fibrillation continue low-dose digoxin beta-blocker and her current anticoagulant Heart failure is compensated blood pressure at target Lipids are ideal she will continue on intensity statin  Next appointment: I will plan to see her in 9 months   Medication Adjustments/Labs and Tests Ordered: Current medicines are reviewed at length with the patient today.  Concerns regarding medicines are outlined above.  Orders Placed This Encounter  Procedures   EKG 12-Lead   No orders of the defined types were placed in this encounter.   Chief complaint follow-up CAD atrial fibrillation on digoxin   History of Present Illness:    Teresa Hughes is a 82 y.o. female with a hx of  severe symptomatic mitral regurgitation with P2 flail leaflet mitral valve prolapse of agitated repair MitraClip 01/20/2021.  Her course was complicated by hemopericardium requiring this pericardiocentesis and drain.  Other problems include CAD with PCI and stenting of the mid right coronary  artery.  She also has mild calcific mitral stenosis mean gradient of 2 mmHg. She remained on digoxin for rate control of her chronic atrial fibrillation. She was 12/14/2021.  Compliance with diet, lifestyle and medications: Yes  Her son is present participates in evaluation decision making Her perspective is that she is doing well pleased with the quality of her life and is not having cardiovascular symptoms of edema shortness of breath chest pain palpitation or syncope and tolerates her anticoagulant without bleeding complication. Her son is very concerned that she has erratic sleep cycle and compliance with medications She did tell me she uses a pillbox and she is 100% mildly    Most recent labs 06/13/2022 cholesterol 138 LDL 53 A1c 5.9 hemoglobin 13.8 creatinine 1.6 potassium 4.6  Echo 01/11/2022: 1. Left ventricular ejection fraction, by estimation, is 55 to 60%. The left ventricle has normal function. The left ventricle has no regional wall motion abnormalities. There is moderate asymmetric left ventricular  hypertrophy of the basal-septal segment. Left ventricular diastolic parameters are indeterminate. The  average left ventricular global longitudinal strain is 19.0 %.   2. Right ventricular systolic function is mildly reduced. The right ventricular size is normal. There is mildly elevated pulmonary artery systolic pressure. The estimated right ventricular systolic pressure is 63.8 mmHg.   3. Left atrial size was severely dilated.   4. Right atrial size was severely dilated.   5. There is a Mitra-Clip present in the mitral position  Mild to moderate mitral valve regurgitation. The mean  mitral valve gradient is 3.0 mmHg with average heart rate of 64 bpm.   6. The aortic valve is tricuspid. Aortic valve regurgitation is mild to moderate. Aortic valve sclerosis/calcification is present, without any evidence of aortic stenosis.   7. Aortic dilatation noted. There is dilatation of the aortic  root, measuring 42 mm.   8. The inferior vena cava is normal in size with <50% respiratory variability, suggesting right atrial pressure of 8 mmHg.   9. Evidence of atrial level shunting detected by color flow Doppler.   Past Medical History:  Diagnosis Date   Acute kidney injury ([place])    Anemia of chronic disease    Aneurysm ([place]) 07/01/2018   Anxiety    Asthma    Cerebral thrombosis with cerebral infarction ([place]) 05/07/2012   CHF (congestive heart failure) ([facility])    Chronic anticoagulation 08/28/2016   Chronic atrial fibrillation ([place]) 08/28/2016   Chronic insomnia    Chronic renal disease    Coronary artery disease    DDD (degenerative disc disease), cervical    Dyspnea    Dysrhythmia    Esophageal stricture    GERD (gastroesophageal reflux disease)    Gout    Heart murmur    Hiatal hernia    High risk medication use 08/28/2016   Hypercalcemia    Hypertension    Hypertensive heart disease without heart failure 08/28/2016   Late effects of CVA (cerebrovascular accident)    Macular degeneration, dry    Osteopenia    Osteoporosis    Other hyperlipidemia 08/28/2016   Paroxysmal SVT (supraventricular tachycardia)    Pneumonia    S/P mitral valve clip implantation 01/20/2021   s/p TEER mitral valve repair using a MitraClip G4 XT W device, positioned A2 P2, reducing mitral regurgitation from severe (4+) at baseline to mild (1+) post procedure   Sleep apnea    Vitamin D deficiency     Past Surgical History:  Procedure Laterality Date   CARDIAC CATHETERIZATION     CATARACT EXTRACTION Bilateral    Right 04/02/17, Left 9/22018   CORONARY ATHERECTOMY N/A 12/22/2020   Procedure: CORONARY ATHERECTOMY;  Surgeon: [name], MD;  Location: [facility];  Service: Cardiovascular;  Laterality: N/A;   CORONARY STENT INTERVENTION N/A 12/22/2020   Procedure: CORONARY STENT INTERVENTION;  Surgeon: [name], MD;  Location: [facility];  Service: Cardiovascular;   Laterality: N/A;   ERCP W/ SPHINCTEROTOMY AND BALLOON DILATION     EYE SURGERY     HEMORRHOID SURGERY  2016   MITRAL VALVE REPAIR N/A 01/20/2021   Procedure: MITRAL VALVE REPAIR;  Surgeon: [name], MD;  Location: [facility];  Service: Cardiovascular;  Laterality: N/A;   PERICARDIOCENTESIS N/A 01/20/2021   Procedure: PERICARDIOCENTESIS;  Surgeon: Jettie Booze, MD;  Location: [facility];  Service: Cardiovascular;  Laterality: N/A;   RIGHT/LEFT HEART CATH AND CORONARY ANGIOGRAPHY N/A 10/29/2020   Procedure: RIGHT/LEFT HEART CATH AND CORONARY ANGIOGRAPHY;  Surgeon: [name], MD;  Location: [facility];  Service: Cardiovascular;  Laterality: N/A;   TEE WITHOUT CARDIOVERSION N/A 09/14/2020   Procedure: TRANSESOPHAGEAL ECHOCARDIOGRAM (TEE);  Surgeon: Buford Dresser, MD;  Location: [facility];  Service: Cardiovascular;  Laterality: N/A;   TEE WITHOUT CARDIOVERSION N/A 01/20/2021   Procedure: TRANSESOPHAGEAL ECHOCARDIOGRAM (TEE);  Surgeon: [name], MD;  Location: [facility];  Service: Cardiovascular;  Laterality: N/A;    Current Medications: Current Meds  Medication Sig   acetaminophen (TYLENOL)  650 MG CR tablet Take 650 mg by mouth every 8 (eight) hours as needed for pain or fever (arthritis).   alendronate (FOSAMAX) 70 MG tablet Take 70 mg by mouth every Sunday. Take with a full glass of water on an empty stomach.   allopurinol (ZYLOPRIM) 300 MG tablet Take 300 mg by mouth at bedtime.   apixaban (ELIQUIS) 2.5 MG TABS tablet Take 1 tablet (2.5 mg total) by mouth 2 (two) times daily.   digoxin (LANOXIN) 0.125 MG tablet Take 0.5 tablets by mouth daily.   furosemide (LASIX) 40 MG tablet Take 40 mg by mouth daily.   metoprolol tartrate (LOPRESSOR) 50 MG tablet Take 50 mg by mouth 2 (two) times daily.   mirtazapine (REMERON) 15 MG tablet Take 7.5 mg by mouth at bedtime.   omega-3 acid ethyl esters (LOVAZA) 1 g capsule Take 2 g by mouth 2  (two) times daily.   pantoprazole (PROTONIX) 40 MG tablet Take 1 tablet (40 mg total) by mouth daily. (Patient taking differently: Take 40 mg by mouth as needed.)   polyethylene glycol (MIRALAX / GLYCOLAX) 17 g packet Take 17 g by mouth daily.   potassium chloride SA (KLOR-CON M) 20 MEQ tablet Take 20 mEq by mouth 2 (two) times daily.   rosuvastatin (CRESTOR) 20 MG tablet Take 1 tablet (20 mg total) by mouth daily.   Turmeric (QC TUMERIC COMPLEX) 500 MG CAPS Take by mouth daily.     Allergies:   Propranolol, Sulfamethoxazole, Codeine, and Latex   Social History   Socioeconomic History   Marital status: Unknown    Spouse name: Not on file   Number of children: Not on file   Years of education: Not on file   Highest education level: Not on file  Occupational History   Not on file  Tobacco Use   Smoking status: Never   Smokeless tobacco: Current    Types: Snuff  Vaping Use   Vaping Use: Never used  Substance and Sexual Activity   Alcohol use: Never   Drug use: Never   Sexual activity: Not on file  Other Topics Concern   Not on file  Social History Narrative   Not on file   Social Determinants of Health   Financial Resource Strain: Not on file  Food Insecurity: Not on file  Transportation Needs: Not on file  Physical Activity: Not on file  Stress: Not on file  Social Connections: Not on file     Family History: The patient's family history includes Aneurysm in her maternal grandmother; Congestive Heart Failure in her father; Diabetes in her mother; Heart attack in her brother, father, and sister; Heart disease in her brother, father, maternal grandfather, and maternal grandmother; Hypertension in her brother and mother; Skin cancer in her brother; Stroke in her maternal grandfather and mother. ROS:   Please see the history of present illness.    All other systems reviewed and are negative.  EKGs/Labs/Other Studies Reviewed:    The following studies were reviewed  today:  EKG:  EKG ordered today and personally reviewed.  The ekg ordered today demonstrates atrial fibrillation controlled ventricular response digoxin effect    See history for labs  Physical Exam:    VS:  BP 132/64 (BP Location: Right Arm, Patient Position: Sitting)   Pulse 89   Ht '5\' 5"'$  (1.651 m)   Wt 124 lb (56.2 kg)   SpO2 99%   BMI 20.63 kg/m     Wt Readings from Last 3  Encounters:  08/16/22 124 lb (56.2 kg)  01/11/22 118 lb 9.6 oz (53.8 kg)  12/14/21 118 lb (53.5 kg)     GEN:  Well nourished, well developed in no acute distress HEENT: Normal NECK: No JVD; No carotid bruits LYMPHATICS: No lymphadenopathy CARDIAC: Irregular rate and rhythm no murmurs, rubs, gallops RESPIRATORY:  Clear to auscultation without rales, wheezing or rhonchi  ABDOMEN: Soft, non-tender, non-distended MUSCULOSKELETAL:  No edema; No deformity  SKIN: Warm and dry NEUROLOGIC:  Alert and oriented x 3 PSYCHIATRIC:  Normal affect    Signed, Shirlee More, MD  08/16/2022 5:04 PM    [place]

## 2022-08-16 ENCOUNTER — Encounter: Payer: Self-pay | Admitting: Cardiology

## 2022-08-16 ENCOUNTER — Ambulatory Visit: Payer: Medicare HMO | Attending: Cardiology | Admitting: Cardiology

## 2022-08-16 VITALS — BP 132/64 | HR 89 | Ht 65.0 in | Wt 124.0 lb

## 2022-08-16 DIAGNOSIS — I482 Chronic atrial fibrillation, unspecified: Secondary | ICD-10-CM

## 2022-08-16 DIAGNOSIS — Z9861 Coronary angioplasty status: Secondary | ICD-10-CM

## 2022-08-16 DIAGNOSIS — Z7901 Long term (current) use of anticoagulants: Secondary | ICD-10-CM

## 2022-08-16 DIAGNOSIS — E7849 Other hyperlipidemia: Secondary | ICD-10-CM

## 2022-08-16 DIAGNOSIS — Z79899 Other long term (current) drug therapy: Secondary | ICD-10-CM | POA: Diagnosis not present

## 2022-08-16 DIAGNOSIS — I2510 Atherosclerotic heart disease of native coronary artery without angina pectoris: Secondary | ICD-10-CM | POA: Diagnosis not present

## 2022-08-16 DIAGNOSIS — Z9889 Other specified postprocedural states: Secondary | ICD-10-CM

## 2022-08-16 DIAGNOSIS — I119 Hypertensive heart disease without heart failure: Secondary | ICD-10-CM

## 2022-08-16 DIAGNOSIS — Z95818 Presence of other cardiac implants and grafts: Secondary | ICD-10-CM

## 2022-08-16 NOTE — Patient Instructions (Signed)
Medication Instructions:  Your physician recommends that you continue on your current medications as directed. Please refer to the Current Medication list given to you today.  *If you need a refill on your cardiac medications before your next appointment, please call your pharmacy*   Lab Work: None If you have labs (blood work) drawn today and your tests are completely normal, you will receive your results only by: [place] (if you have MyChart) OR A paper copy in the mail If you have any lab test that is abnormal or we need to change your treatment, we will call you to review the results.   Testing/Procedures: None   Follow-Up: At [facility], you and your health needs are our priority.  As part of our continuing mission to provide you with exceptional heart care, we have created designated Provider Care Teams.  These Care Teams include your primary Cardiologist (physician) and Advanced Practice Providers (APPs -  Physician Assistants and Nurse Practitioners) who all work together to provide you with the care you need, when you need it.  We recommend signing up for the patient portal called "MyChart".  Sign up information is provided on this After Visit Summary.  MyChart is used to connect with patients for Virtual Visits (Telemedicine).  Patients are able to view lab/test results, encounter notes, upcoming appointments, etc.  Non-urgent messages can be sent to your provider as well.   To learn more about what you can do with MyChart, go to [email or web address].    Your next appointment:   9 month(s)  The format for your next appointment:   In Person  Provider:   Shirlee More, MD    Other Instructions None  Important Information About [place]

## 2022-09-15 DIAGNOSIS — I480 Paroxysmal atrial fibrillation: Secondary | ICD-10-CM | POA: Diagnosis not present

## 2022-09-15 DIAGNOSIS — I10 Essential (primary) hypertension: Secondary | ICD-10-CM | POA: Diagnosis not present

## 2022-09-15 DIAGNOSIS — I5032 Chronic diastolic (congestive) heart failure: Secondary | ICD-10-CM | POA: Diagnosis not present

## 2022-09-15 DIAGNOSIS — Z9181 History of falling: Secondary | ICD-10-CM | POA: Diagnosis not present

## 2022-09-15 DIAGNOSIS — Z79899 Other long term (current) drug therapy: Secondary | ICD-10-CM | POA: Diagnosis not present

## 2022-09-15 DIAGNOSIS — E785 Hyperlipidemia, unspecified: Secondary | ICD-10-CM | POA: Diagnosis not present

## 2022-09-15 DIAGNOSIS — Z1331 Encounter for screening for depression: Secondary | ICD-10-CM | POA: Diagnosis not present

## 2022-09-15 DIAGNOSIS — N183 Chronic kidney disease, stage 3 unspecified: Secondary | ICD-10-CM | POA: Diagnosis not present

## 2022-09-15 DIAGNOSIS — R7301 Impaired fasting glucose: Secondary | ICD-10-CM | POA: Diagnosis not present

## 2022-09-15 DIAGNOSIS — D638 Anemia in other chronic diseases classified elsewhere: Secondary | ICD-10-CM | POA: Diagnosis not present

## 2022-09-15 DIAGNOSIS — M109 Gout, unspecified: Secondary | ICD-10-CM | POA: Diagnosis not present

## 2022-09-15 DIAGNOSIS — I639 Cerebral infarction, unspecified: Secondary | ICD-10-CM | POA: Diagnosis not present

## 2022-09-15 DIAGNOSIS — G8194 Hemiplegia, unspecified affecting left nondominant side: Secondary | ICD-10-CM | POA: Diagnosis not present

## 2022-10-05 ENCOUNTER — Encounter (INDEPENDENT_AMBULATORY_CARE_PROVIDER_SITE_OTHER): Payer: Medicare HMO | Admitting: Ophthalmology

## 2022-10-05 DIAGNOSIS — H43813 Vitreous degeneration, bilateral: Secondary | ICD-10-CM | POA: Diagnosis not present

## 2022-10-05 DIAGNOSIS — I10 Essential (primary) hypertension: Secondary | ICD-10-CM

## 2022-10-05 DIAGNOSIS — H35033 Hypertensive retinopathy, bilateral: Secondary | ICD-10-CM | POA: Diagnosis not present

## 2022-10-05 DIAGNOSIS — H353221 Exudative age-related macular degeneration, left eye, with active choroidal neovascularization: Secondary | ICD-10-CM

## 2022-10-05 DIAGNOSIS — H353112 Nonexudative age-related macular degeneration, right eye, intermediate dry stage: Secondary | ICD-10-CM

## 2022-10-05 DIAGNOSIS — H348312 Tributary (branch) retinal vein occlusion, right eye, stable: Secondary | ICD-10-CM | POA: Diagnosis not present

## 2022-10-23 DIAGNOSIS — Z1231 Encounter for screening mammogram for malignant neoplasm of breast: Secondary | ICD-10-CM | POA: Diagnosis not present

## 2022-12-05 ENCOUNTER — Telehealth: Payer: Self-pay

## 2022-12-05 NOTE — Patient Outreach (Signed)
  Care Coordination   Initial Visit Note   12/05/2022 Name: Teresa Hughes MRN: [id number] DOB: 1940-09-05  Teresa Hughes is a 82 y.o. year old female who sees Paulina Fusi, MD for primary care. I spoke with  Cory Roughen by phone today.Spoke with son.  What matters to the patients health and wellness today?  Placed call to patient and spoke with son. Explained and offered  [facility] care coordination program. Reviewed patients condition and pt being unwilling to get assistance. Encouraged son to requested assistance in the future is son needs assistance. Son reports that patient sleeps all day and stays up all night. Encouraged son to contact [facility] or MD if he needs assistance in the future.    SDOH assessments and interventions completed:  No     Care Coordination Interventions:  Yes, provided  Interventions Today    Flowsheet Row Most Recent Value  General Interventions   General Interventions Discussed/Reviewed Communication with  [facility] Care coordination program. Provided my contact information for future reference.]       Follow up plan: No further intervention required.   Encounter Outcome:  Pt. Visit Completed   Rowe Pavy, RN, BSN, CEN [facility] Coordinator [phone]

## 2022-12-14 DIAGNOSIS — I480 Paroxysmal atrial fibrillation: Secondary | ICD-10-CM | POA: Diagnosis not present

## 2022-12-14 DIAGNOSIS — D638 Anemia in other chronic diseases classified elsewhere: Secondary | ICD-10-CM | POA: Diagnosis not present

## 2022-12-14 DIAGNOSIS — I639 Cerebral infarction, unspecified: Secondary | ICD-10-CM | POA: Diagnosis not present

## 2022-12-14 DIAGNOSIS — Z79899 Other long term (current) drug therapy: Secondary | ICD-10-CM | POA: Diagnosis not present

## 2022-12-14 DIAGNOSIS — I5032 Chronic diastolic (congestive) heart failure: Secondary | ICD-10-CM | POA: Diagnosis not present

## 2022-12-14 DIAGNOSIS — R7301 Impaired fasting glucose: Secondary | ICD-10-CM | POA: Diagnosis not present

## 2022-12-14 DIAGNOSIS — N183 Chronic kidney disease, stage 3 unspecified: Secondary | ICD-10-CM | POA: Diagnosis not present

## 2022-12-14 DIAGNOSIS — E785 Hyperlipidemia, unspecified: Secondary | ICD-10-CM | POA: Diagnosis not present

## 2022-12-14 DIAGNOSIS — G8194 Hemiplegia, unspecified affecting left nondominant side: Secondary | ICD-10-CM | POA: Diagnosis not present

## 2022-12-14 DIAGNOSIS — M109 Gout, unspecified: Secondary | ICD-10-CM | POA: Diagnosis not present

## 2022-12-14 DIAGNOSIS — I10 Essential (primary) hypertension: Secondary | ICD-10-CM | POA: Diagnosis not present

## 2023-03-01 ENCOUNTER — Encounter (INDEPENDENT_AMBULATORY_CARE_PROVIDER_SITE_OTHER): Payer: Medicare HMO | Admitting: Ophthalmology

## 2023-03-01 DIAGNOSIS — H43813 Vitreous degeneration, bilateral: Secondary | ICD-10-CM

## 2023-03-01 DIAGNOSIS — H348312 Tributary (branch) retinal vein occlusion, right eye, stable: Secondary | ICD-10-CM

## 2023-03-01 DIAGNOSIS — H353221 Exudative age-related macular degeneration, left eye, with active choroidal neovascularization: Secondary | ICD-10-CM

## 2023-03-01 DIAGNOSIS — I10 Essential (primary) hypertension: Secondary | ICD-10-CM | POA: Diagnosis not present

## 2023-03-01 DIAGNOSIS — H353112 Nonexudative age-related macular degeneration, right eye, intermediate dry stage: Secondary | ICD-10-CM | POA: Diagnosis not present

## 2023-03-01 DIAGNOSIS — H35033 Hypertensive retinopathy, bilateral: Secondary | ICD-10-CM | POA: Diagnosis not present

## 2023-03-15 DIAGNOSIS — R7301 Impaired fasting glucose: Secondary | ICD-10-CM | POA: Diagnosis not present

## 2023-03-15 DIAGNOSIS — D638 Anemia in other chronic diseases classified elsewhere: Secondary | ICD-10-CM | POA: Diagnosis not present

## 2023-03-15 DIAGNOSIS — I639 Cerebral infarction, unspecified: Secondary | ICD-10-CM | POA: Diagnosis not present

## 2023-03-15 DIAGNOSIS — M109 Gout, unspecified: Secondary | ICD-10-CM | POA: Diagnosis not present

## 2023-03-15 DIAGNOSIS — I10 Essential (primary) hypertension: Secondary | ICD-10-CM | POA: Diagnosis not present

## 2023-03-15 DIAGNOSIS — N183 Chronic kidney disease, stage 3 unspecified: Secondary | ICD-10-CM | POA: Diagnosis not present

## 2023-03-15 DIAGNOSIS — I5032 Chronic diastolic (congestive) heart failure: Secondary | ICD-10-CM | POA: Diagnosis not present

## 2023-03-15 DIAGNOSIS — I480 Paroxysmal atrial fibrillation: Secondary | ICD-10-CM | POA: Diagnosis not present

## 2023-03-15 DIAGNOSIS — E785 Hyperlipidemia, unspecified: Secondary | ICD-10-CM | POA: Diagnosis not present

## 2023-03-15 DIAGNOSIS — G8194 Hemiplegia, unspecified affecting left nondominant side: Secondary | ICD-10-CM | POA: Diagnosis not present

## 2023-03-15 DIAGNOSIS — Z79899 Other long term (current) drug therapy: Secondary | ICD-10-CM | POA: Diagnosis not present

## 2023-07-26 ENCOUNTER — Encounter (INDEPENDENT_AMBULATORY_CARE_PROVIDER_SITE_OTHER): Payer: Medicare HMO | Admitting: Ophthalmology

## 2023-07-26 DIAGNOSIS — H43813 Vitreous degeneration, bilateral: Secondary | ICD-10-CM

## 2023-07-26 DIAGNOSIS — H353221 Exudative age-related macular degeneration, left eye, with active choroidal neovascularization: Secondary | ICD-10-CM

## 2023-07-26 DIAGNOSIS — H348312 Tributary (branch) retinal vein occlusion, right eye, stable: Secondary | ICD-10-CM | POA: Diagnosis not present

## 2023-07-26 DIAGNOSIS — H35033 Hypertensive retinopathy, bilateral: Secondary | ICD-10-CM

## 2023-07-26 DIAGNOSIS — H353112 Nonexudative age-related macular degeneration, right eye, intermediate dry stage: Secondary | ICD-10-CM | POA: Diagnosis not present

## 2023-07-26 DIAGNOSIS — I10 Essential (primary) hypertension: Secondary | ICD-10-CM

## 2023-09-18 DIAGNOSIS — N183 Chronic kidney disease, stage 3 unspecified: Secondary | ICD-10-CM | POA: Diagnosis not present

## 2023-09-18 DIAGNOSIS — M109 Gout, unspecified: Secondary | ICD-10-CM | POA: Diagnosis not present

## 2023-09-18 DIAGNOSIS — Z23 Encounter for immunization: Secondary | ICD-10-CM | POA: Diagnosis not present

## 2023-09-18 DIAGNOSIS — I639 Cerebral infarction, unspecified: Secondary | ICD-10-CM | POA: Diagnosis not present

## 2023-09-18 DIAGNOSIS — D638 Anemia in other chronic diseases classified elsewhere: Secondary | ICD-10-CM | POA: Diagnosis not present

## 2023-09-18 DIAGNOSIS — R7301 Impaired fasting glucose: Secondary | ICD-10-CM | POA: Diagnosis not present

## 2023-09-18 DIAGNOSIS — I10 Essential (primary) hypertension: Secondary | ICD-10-CM | POA: Diagnosis not present

## 2023-09-18 DIAGNOSIS — Z79899 Other long term (current) drug therapy: Secondary | ICD-10-CM | POA: Diagnosis not present

## 2023-09-18 DIAGNOSIS — E785 Hyperlipidemia, unspecified: Secondary | ICD-10-CM | POA: Diagnosis not present

## 2023-09-18 DIAGNOSIS — I5032 Chronic diastolic (congestive) heart failure: Secondary | ICD-10-CM | POA: Diagnosis not present

## 2023-09-18 DIAGNOSIS — I480 Paroxysmal atrial fibrillation: Secondary | ICD-10-CM | POA: Diagnosis not present

## 2023-09-18 DIAGNOSIS — G8194 Hemiplegia, unspecified affecting left nondominant side: Secondary | ICD-10-CM | POA: Diagnosis not present

## 2023-10-26 DIAGNOSIS — Z1231 Encounter for screening mammogram for malignant neoplasm of breast: Secondary | ICD-10-CM | POA: Diagnosis not present

## 2023-10-27 DIAGNOSIS — Z1231 Encounter for screening mammogram for malignant neoplasm of breast: Secondary | ICD-10-CM | POA: Diagnosis not present

## 2023-11-27 DIAGNOSIS — H524 Presbyopia: Secondary | ICD-10-CM | POA: Diagnosis not present

## 2023-11-27 DIAGNOSIS — H43393 Other vitreous opacities, bilateral: Secondary | ICD-10-CM | POA: Diagnosis not present

## 2023-11-27 DIAGNOSIS — Z961 Presence of intraocular lens: Secondary | ICD-10-CM | POA: Diagnosis not present

## 2023-11-27 DIAGNOSIS — D3132 Benign neoplasm of left choroid: Secondary | ICD-10-CM | POA: Diagnosis not present

## 2023-11-27 DIAGNOSIS — H353132 Nonexudative age-related macular degeneration, bilateral, intermediate dry stage: Secondary | ICD-10-CM | POA: Diagnosis not present

## 2023-11-27 DIAGNOSIS — H31113 Age-related choroidal atrophy, bilateral: Secondary | ICD-10-CM | POA: Diagnosis not present

## 2023-11-27 DIAGNOSIS — H4322 Crystalline deposits in vitreous body, left eye: Secondary | ICD-10-CM | POA: Diagnosis not present

## 2023-12-20 ENCOUNTER — Encounter (INDEPENDENT_AMBULATORY_CARE_PROVIDER_SITE_OTHER): Payer: Medicare HMO | Admitting: Ophthalmology

## 2023-12-20 DIAGNOSIS — I10 Essential (primary) hypertension: Secondary | ICD-10-CM | POA: Diagnosis not present

## 2023-12-20 DIAGNOSIS — H353221 Exudative age-related macular degeneration, left eye, with active choroidal neovascularization: Secondary | ICD-10-CM | POA: Diagnosis not present

## 2023-12-20 DIAGNOSIS — H35033 Hypertensive retinopathy, bilateral: Secondary | ICD-10-CM

## 2023-12-20 DIAGNOSIS — H43813 Vitreous degeneration, bilateral: Secondary | ICD-10-CM | POA: Diagnosis not present

## 2023-12-20 DIAGNOSIS — H353112 Nonexudative age-related macular degeneration, right eye, intermediate dry stage: Secondary | ICD-10-CM

## 2023-12-20 DIAGNOSIS — H348312 Tributary (branch) retinal vein occlusion, right eye, stable: Secondary | ICD-10-CM | POA: Diagnosis not present

## 2024-01-17 ENCOUNTER — Encounter: Payer: Self-pay | Admitting: Cardiology

## 2024-02-20 ENCOUNTER — Ambulatory Visit: Admitting: Cardiology

## 2024-02-21 NOTE — Progress Notes (Signed)
 Cardiology Office Note   Date:  02/22/2024  ID:  Teresa Hughes, DOB 06/14/1941, MRN [id number] PCP: Keren Vicenta BRAVO, MD  [facility] Providers Cardiologist:  Redell Leiter, MD Cardiology APP:  Carlin Delon BROCKS, NP     History of Present Illness [name] is a 83 y.o. female with a past medical history of CAD DES x 1 RCA 2022, severe MR s/p mitral valve repair 2022, history of CVA, chronic atrial fibrillation, SVT, history of pericardial effusion, hypertension, GERD, CKD, dyslipidemia.  01/11/2022 echo EF 55 to 60%, moderate asymmetric LVH of the basal septal segment, mildly elevated PASP, LA severely dilated, RA severely dilated, mitral clip present in the mitral position with mild to moderate MR with a mean gradient of 3.0 mmHg, aortic valve sclerosis present without stenosis, dilatation of the aortic root at 42 mm 01/20/2021 cardiac cath successful transcatheter edge-to-edge mitral valve repair using MitraClip reducing severe MR to mild 12/22/2020 cardiac cath successful atherectomy and stenting of severe calcified complex stenosis in the mid RCA with DES x 1 10/29/2020 cardiac cath severe two-vessel CAD with severe complete stenosis in the mid RCA and severe stenosis of the proximal LAD   [name] is a longstanding patient of Dr. Leiter, initially established care for management of her chronic atrial fibrillation and HFpEF.  She had had an echocardiogram in 2022 revealing severe MR and holosystolic prolapse.  During her workup for repair of her mitral valve left heart cath revealed severe two-vessel CAD.  TCTS was consulted however she was not a surgical candidate so she eventually underwent a staged procedure in May 2022 with successful atherectomy and stenting of her RCA.  In June 2022 she underwent successful transcatheter edge-to-edge mitral valve repair reducing her mitral regurgitation from severe to mild, postprocedure complication involved hemopericardium requiring  pericardiocentesis.  Most recently evaluated by Dr. Leiter on 08/16/2022, she was overall stable from a cardiac perspective and doing well, no changes were made to her medications or plan of care and she was advised to follow-up in 9 months.  She presents today accompanied by her son for follow-up.  She has been doing well since she was last evaluated in our office, has no formal complaints outside of fatigue which she said has been persistent and ongoing for many years.  She is tolerating her Eliquis  without any adverse side effects including hematochezia, hematuria, hemoptysis.  Her blood pressure is controlled.  She does have intermittent episodes of shortness of breath, but overall this is stable and unchanged for her as well and she feels it is improved since her MVR. She denies chest pain, palpitations, dyspnea, pnd, orthopnea, n, v, dizziness, syncope, edema, weight gain, or early satiety.   ROS: Review of Systems  Constitutional:  Positive for malaise/fatigue.  Respiratory:  Positive for shortness of breath.   All other systems reviewed and are negative.    Studies Reviewed EKG Interpretation Date/Time:  Friday February 22 2024 15:33:24 EDT Ventricular Rate:  74 PR Interval:    QRS Duration:  98 QT Interval:  368 QTC Calculation: 408 R Axis:   -41  Text Interpretation: Atrial fibrillation with premature ventricular or aberrantly conducted complexes Left axis deviation Septal infarct (cited on or before 21-Jan-2021) ST & T wave abnormality, consider inferolateral ischemia Abnormal ECG When compared with ECG of 21-Jan-2021 06:54, QRS axis Shifted left QT has shortened Confirmed by Carlin Delon [phone]) on 02/22/2024 3:45:12 PM    Cardiac Studies & Procedures   ______________________________________________________________________________________________ CARDIAC  CATHETERIZATION  CARDIAC CATHETERIZATION 01/20/2021  Conclusion  Successful pericardiocentesis from the subxiphoid approach with  removal of 450 cc bloody fluid.  Drain will be left in place.  Further management based on the amount of drainage.   CARDIAC CATHETERIZATION 01/20/2021  Conclusion Successful transcatheter edge-to-edge mitral valve repair using a MitraClip G4 XT W device, positioned A2 P2, reducing mitral regurgitation from severe (4+) at baseline to mild (1+) post procedure.     ECHOCARDIOGRAM  ECHOCARDIOGRAM COMPLETE 01/11/2022  Narrative ECHOCARDIOGRAM REPORT    Patient Name:   Teresa Hughes Date of Exam: 01/11/2022 Medical Rec #:  [id number]        Height:       66.0 in Accession #:    [id number]       Weight:       118.0 lb Date of Birth:  1940-09-04        BSA:          1.598 m Patient Age:    81 years         BP:           114/72 mmHg Patient Gender: F                HR:           57 bpm. Exam Location:  [address]  Procedure: 2D Echo, 3D Echo, Cardiac Doppler, Color Doppler and Strain Analysis  Indications:    Z98.890 Status Post Mitra-Clip (1 year)  History:        Patient has prior history of Echocardiogram examinations, most recent 03/02/2021. CHF, CAD, Stroke, Arrythmias:Atrial Fibrillation, Signs/Symptoms:Dyspnea and Murmur; Risk Factors:Hypertension, Dyslipidemia, Family History of Coronary Artery Disease and Sleep Apnea. Status Post Mitra-Clip ( 01/20/21) with Pericardiocentesis, PFO.  Sonographer:    Heather Hawks RDCS Referring Phys: KATHRYN R THOMPSON  IMPRESSIONS   1. Left ventricular ejection fraction, by estimation, is 55 to 60%. The left ventricle has normal function. The left ventricle has no regional wall motion abnormalities. There is moderate asymmetric left ventricular hypertrophy of the basal-septal segment. Left ventricular diastolic parameters are indeterminate. The average left ventricular global longitudinal strain is 19.0 %. 2. Right ventricular systolic function is mildly reduced. The right ventricular size is normal. There is mildly elevated  pulmonary artery systolic pressure. The estimated right ventricular systolic pressure is 44.7 mmHg. 3. Left atrial size was severely dilated. 4. Right atrial size was severely dilated. 5. There is a Mitra-Clip present in the mitral position Mild to moderate mitral valve regurgitation. The mean mitral valve gradient is 3.0 mmHg with average heart rate of 64 bpm. 6. The aortic valve is tricuspid. Aortic valve regurgitation is mild to moderate. Aortic valve sclerosis/calcification is present, without any evidence of aortic stenosis. 7. Aortic dilatation noted. There is dilatation of the aortic root, measuring 42 mm. 8. The inferior vena cava is normal in size with <50% respiratory variability, suggesting right atrial pressure of 8 mmHg. 9. Evidence of atrial level shunting detected by color flow Doppler.  FINDINGS Left Ventricle: Left ventricular ejection fraction, by estimation, is 55 to 60%. The left ventricle has normal function. The left ventricle has no regional wall motion abnormalities. The average left ventricular global longitudinal strain is 19.0 %. The left ventricular internal cavity size was normal in size. There is moderate asymmetric left ventricular hypertrophy of the basal-septal segment. Left ventricular diastolic parameters are indeterminate.  Right Ventricle: The right ventricular size is normal. No increase in right ventricular wall thickness.  Right ventricular systolic function is mildly reduced. There is mildly elevated pulmonary artery systolic pressure. The tricuspid regurgitant velocity is 3.03 m/s, and with an assumed right atrial pressure of 8 mmHg, the estimated right ventricular systolic pressure is 44.7 mmHg.  Left Atrium: Left atrial size was severely dilated.  Right Atrium: Right atrial size was severely dilated.  Pericardium: There is no evidence of pericardial effusion.  Mitral Valve: The mitral valve has been repaired/replaced. Mild to moderate mitral valve  regurgitation. There is a Mitra-Clip present in the mitral position. The mean mitral valve gradient is 3.0 mmHg with average heart rate of 64 bpm.  Tricuspid Valve: The tricuspid valve is normal in structure. Tricuspid valve regurgitation is mild.  Aortic Valve: The aortic valve is tricuspid. Aortic valve regurgitation is mild to moderate. Aortic regurgitation PHT measures 862 msec. Aortic valve sclerosis/calcification is present, without any evidence of aortic stenosis. Aortic valve mean gradient measures 4.0 mmHg. Aortic valve peak gradient measures 6.5 mmHg. Aortic valve area, by VTI measures 2.36 cm.  Pulmonic Valve: The pulmonic valve was grossly normal. Pulmonic valve regurgitation is mild.  Aorta: Aortic dilatation noted. There is dilatation of the aortic root, measuring 42 mm.  Venous: The inferior vena cava is normal in size with less than 50% respiratory variability, suggesting right atrial pressure of 8 mmHg.  IAS/Shunts: Evidence of atrial level shunting detected by color flow Doppler.   LEFT VENTRICLE PLAX 2D LVIDd:         4.10 cm   Diastology LVIDs:         2.70 cm   LV e' medial:    6.09 cm/s LV PW:         1.00 cm   LV E/e' medial:  22.7 LV IVS:        1.15 cm   LV e' lateral:   9.57 cm/s LVOT diam:     2.20 cm   LV E/e' lateral: 14.5 LV SV:         60 LV SV Index:   38        2D Longitudinal Strain LVOT Area:     3.80 cm  2D Strain GLS (A2C):   21.7 % 2D Strain GLS (A3C):   19.9 % 2D Strain GLS (A4C):   15.6 % 2D Strain GLS Avg:     19.0 %  3D Volume EF: 3D EF:        60 % LV EDV:       147 ml LV ESV:       59 ml LV SV:        88 ml  RIGHT VENTRICLE RV Basal diam:  3.30 cm RV S prime:     6.53 cm/s TAPSE (M-mode): 1.4 cm  LEFT ATRIUM              Index        RIGHT ATRIUM           Index LA diam:        4.10 cm  2.57 cm/m   RA Area:     23.70 cm LA Vol (A2C):   113.0 ml 70.71 ml/m  RA Volume:   79.70 ml  49.87 ml/m LA Vol (A4C):   91.8 ml  57.44  ml/m LA Biplane Vol: 102.0 ml 63.83 ml/m AORTIC VALVE AV Area (Vmax):    2.49 cm AV Area (Vmean):   2.45 cm AV Area (VTI):     2.36 cm AV Vmax:  127.00 cm/s AV Vmean:          86.200 cm/s AV VTI:            0.254 m AV Peak Grad:      6.5 mmHg AV Mean Grad:      4.0 mmHg LVOT Vmax:         83.35 cm/s LVOT Vmean:        55.550 cm/s LVOT VTI:          0.158 m LVOT/AV VTI ratio: 0.62 AI PHT:            862 msec  AORTA Ao Root diam: 4.20 cm Ao Asc diam:  3.50 cm  MITRAL VALVE                TRICUSPID VALVE MV Area (PHT): cm          TR Peak grad:   36.7 mmHg MV Mean grad:  3.0 mmHg     TR Vmax:        303.00 cm/s MV Decel Time: 373 msec MR Peak grad: 171.6 mmHg    SHUNTS MR Mean grad: 105.5 mmHg    Systemic VTI:  0.16 m MR Vmax:      655.00 cm/s   Systemic Diam: 2.20 cm MR Vmean:     483.5 cm/s MV E velocity: 138.50 cm/s MV A velocity: 43.40 cm/s MV E/A ratio:  3.19  Lonni Nanas MD Electronically signed by Lonni Nanas MD Signature Date/Time: 01/11/2022/2:24:15 PM    Final   TEE  ECHO TEE 01/20/2021  Narrative TRANSESOPHOGEAL ECHO REPORT    Patient Name:   CLAIR BARDWELL Date of Exam: 01/20/2021 Medical Rec #:  [id number]        Height:       66.0 in Accession #:    [id number]       Weight:       122.1 lb Date of Birth:  26-Apr-1941        BSA:          1.622 m Patient Age:    80 years         BP:           190/61 mmHg Patient Gender: F                HR:           85 bpm. Exam Location:  Inpatient  Procedure: Transesophageal Echo and 3D Echo  Indications:     I34.0 Nonrheumatic mitral (valve) insufficiency  History:         Patient has prior history of Echocardiogram examinations, most recent 09/14/2020. CHF, Arrythmias:Atrial Fibrillation and SVT; Risk Factors:Hypertension.  Mitral Valve: Mitra-Clip valve is present in the mitral position. Procedure Date: 01/20/2021.  Sonographer:     Damien Senior RDCS Referring Phys:  [id number]  MICHAEL COOPER Diagnosing Phys: Darryle Decent MD   Sonographer Comments: 1 MitraClip placed A2P2   PROCEDURE: After discussion of the risks and benefits of a TEE, an informed consent was obtained from the patient. The transesophogeal probe was passed without difficulty through the esophogus of the patient. Sedation performed by different physician. Image quality was excellent. The patient's vital signs; including heart rate, blood pressure, and oxygen saturation; remained stable throughout the procedure. The patient developed no complications during the procedure.  IMPRESSIONS   1. Echo guided mitraclip procedure. Severe mitral regurgitation was present due to a flail P2 segment. 2D PISA radius 1.0, ERO 0.53 cm2,  R vol 84 cc. IAS puncture was directly visualized on echo. An XTW mitraclip was placed in the A2-P2 position. The posteromedial orifice had a MG of 2 mmHG @ 109 bpm. The anterolateral orifice had a MG of 2 mmHG @ 70 bpm. There was mild central mitral regurgitation after clip placement. Systolic flow in all pulmonary veins became systolic dominant after clip placement. MVA of the anterolateral orifice was 1.92 cm2 and the posteromedial orificed was 2.31 cm2 (Total 4.2 cm2). No significant stenosis was present. There was a small iatrogenic PFO after the procedure with L to R shunting. There was a small pericardial effusion anterior to the RV before and after the procedure without any increase in size. There no apparent complications during the procedure. 2. Left ventricular ejection fraction, by estimation, is 55 to 60%. The left ventricle has normal function. The left ventricle has no regional wall motion abnormalities. 3. Right ventricular systolic function is normal. The right ventricular size is normal. 4. Left atrial size was moderately dilated. No left atrial/left atrial appendage thrombus was detected. The LAA emptying velocity was 56 cm/s. 5. A small pericardial effusion is  present. The pericardial effusion is circumferential. 6. The mitral valve is myxomatous. Severe mitral valve regurgitation. No evidence of mitral stenosis. There is a Mitra-Clip present in the mitral position. Procedure Date: 01/20/2021. 7. The aortic valve is tricuspid. Aortic valve regurgitation is mild. Mild aortic valve sclerosis is present, with no evidence of aortic valve stenosis. 8. There is Moderate (Grade III) layered plaque involving the transverse aorta and descending aorta.  FINDINGS Left Ventricle: Left ventricular ejection fraction, by estimation, is 55 to 60%. The left ventricle has normal function. The left ventricle has no regional wall motion abnormalities. The left ventricular internal cavity size was normal in size.  Right Ventricle: The right ventricular size is normal. No increase in right ventricular wall thickness. Right ventricular systolic function is normal.  Left Atrium: Left atrial size was moderately dilated. No left atrial/left atrial appendage thrombus was detected. The LAA emptying velocity was 56 cm/s.  Right Atrium: Right atrial size was normal in size.  Pericardium: A small pericardial effusion is present. The pericardial effusion is circumferential.  Mitral Valve: The mitral valve is myxomatous. Severe mitral valve regurgitation. There is a Mitra-Clip present in the mitral position. Procedure Date: 01/20/2021. No evidence of mitral valve stenosis. MV peak gradient, 7.2 mmHg. The mean mitral valve gradient is 1.5 mmHg.  Tricuspid Valve: The tricuspid valve is normal in structure. Tricuspid valve regurgitation is mild . No evidence of tricuspid stenosis.  Aortic Valve: The aortic valve is tricuspid. Aortic valve regurgitation is mild. Mild aortic valve sclerosis is present, with no evidence of aortic valve stenosis.  Pulmonic Valve: The pulmonic valve was normal in structure. Pulmonic valve regurgitation is trivial. No evidence of pulmonic stenosis.  Aorta:  The aortic root and ascending aorta are structurally normal, with no evidence of dilitation. There is moderate (Grade III) layered plaque involving the transverse aorta and descending aorta.  IAS/Shunts: The atrial septum is grossly normal.  EKG: Rhythm strip during this exam demostrated atrial fibrillation.   LEFT VENTRICLE PLAX 2D LVOT diam:     2.20 cm LV SV:         42 LV SV Index:   26 LVOT Area:     3.80 cm   AORTIC VALVE LVOT Vmax:   73.20 cm/s LVOT Vmean:  51.100 cm/s LVOT VTI:    0.110 m  AORTA Ao  Root diam: 3.67 cm Ao Asc diam:  3.55 cm  MITRAL VALVE                 TRICUSPID VALVE MV Area VTI:  1.18 cm       TR Peak grad:   15.8 mmHg MV Peak grad: 7.2 mmHg       TR Vmax:        199.00 cm/s MV Mean grad: 1.5 mmHg MV Vmax:      1.34 m/s       SHUNTS MV Vmean:     53.0 cm/s      Systemic VTI:  0.11 m MR Peak grad:    83.2 mmHg   Systemic Diam: 2.20 cm MR Mean grad:    53.0 mmHg MR Vmax:         456.00 cm/s MR Vmean:        347.0 cm/s MR PISA:         6.28 cm MR PISA Eff ROA: 53 mm MR PISA Radius:  1.00 cm  Darryle Decent MD Electronically signed by Darryle Decent MD Signature Date/Time: 01/20/2021/6:24:23 PM    Final        ______________________________________________________________________________________________      Risk Assessment/Calculations  CHA2DS2-VASc Score = 7   This indicates a 11.2% annual risk of stroke. The patient's score is based upon: CHF History: 0 HTN History: 1 Diabetes History: 0 Stroke History: 2 Vascular Disease History: 1 Age Score: 2 Gender Score: 1            Physical Exam VS:  BP 138/82   Pulse 74   Ht 5' 6 (1.676 m)   Wt 127 lb (57.6 kg)   SpO2 94%   BMI 20.50 kg/m        Wt Readings from Last 3 Encounters:  02/22/24 127 lb (57.6 kg)  08/16/22 124 lb (56.2 kg)  01/11/22 118 lb 9.6 oz (53.8 kg)    GEN: Well nourished, well developed in no acute distress NECK: No JVD; No carotid bruits CARDIAC:  Irregularly irregular, no murmurs, rubs, gallops RESPIRATORY:  Clear to auscultation without rales, wheezing or rhonchi  ABDOMEN: Soft, non-tender, non-distended EXTREMITIES:  No edema; No deformity   ASSESSMENT AND PLAN S/p MVR-will repeat echocardiogram.  SBE is provided by her dentist.  Permanent atrial fibrillation/hypercoagulable state-her rate is controlled, CHA2DS2-VASc score is 7, continue Eliquis  currently on 2.5 mg twice daily--denies hematochezia, hematuria, hemoptysis, will recheck CBC and BMET.  Continue digoxin  0.5 mg daily, no evidence of toxicity.  Continue metoprolol  50 mg twice daily.  CAD-she was not a surgical candidate for CABG however in 2022 she did undergo left heart catheter revealing cardiac cath successful atherectomy and stenting of severe calcified complex stenosis in the mid RCA with DES x 1. Stable with no anginal symptoms. No indication for ischemic evaluation.  Continue Crestor  20 mg daily, currently on Eliquis  therefore she is not on aspirin .   Hypertension-blood pressure is controlled 138/82, continue metoprolol  50 mg twice daily  SVT-quiescent continue metoprolol  50 mg twice daily.  Dyslipidemia-currently on Crestor  20 mg daily, appears to be monitored by PCP, most recent LDL was well-controlled at 51.       Dispo: CBC, BMET, echo, follow up in 1 year.   Signed, Delon JAYSON Hoover, NP

## 2024-02-22 ENCOUNTER — Encounter: Payer: Self-pay | Admitting: Cardiology

## 2024-02-22 ENCOUNTER — Ambulatory Visit: Attending: Cardiology | Admitting: Cardiology

## 2024-02-22 VITALS — BP 138/82 | HR 74 | Ht 66.0 in | Wt 127.0 lb

## 2024-02-22 DIAGNOSIS — Z9889 Other specified postprocedural states: Secondary | ICD-10-CM

## 2024-02-22 DIAGNOSIS — Z9861 Coronary angioplasty status: Secondary | ICD-10-CM

## 2024-02-22 DIAGNOSIS — E782 Mixed hyperlipidemia: Secondary | ICD-10-CM | POA: Diagnosis not present

## 2024-02-22 DIAGNOSIS — I482 Chronic atrial fibrillation, unspecified: Secondary | ICD-10-CM | POA: Diagnosis not present

## 2024-02-22 DIAGNOSIS — I2510 Atherosclerotic heart disease of native coronary artery without angina pectoris: Secondary | ICD-10-CM

## 2024-02-22 DIAGNOSIS — D6859 Other primary thrombophilia: Secondary | ICD-10-CM

## 2024-02-22 DIAGNOSIS — I471 Supraventricular tachycardia, unspecified: Secondary | ICD-10-CM

## 2024-02-22 DIAGNOSIS — Z95818 Presence of other cardiac implants and grafts: Secondary | ICD-10-CM

## 2024-02-22 NOTE — Patient Instructions (Signed)
 Medication Instructions:  Your physician recommends that you continue on your current medications as directed. Please refer to the Current Medication list given to you today.  *If you need a refill on your cardiac medications before your next appointment, please call your pharmacy*  Lab Work: Your physician recommends that you return for lab work in:   Labs today: CBC, BMP   If you have labs (blood work) drawn today and your tests are completely normal, you will receive your results only by: MyChart Message (if you have MyChart) OR A paper copy in the mail If you have any lab test that is abnormal or we need to change your treatment, we will call you to review the results.  Testing/Procedures: Your physician has requested that you have an echocardiogram. Echocardiography is a painless test that uses sound waves to create images of your heart. It provides your doctor with information about the size and shape of your heart and how well your heart's chambers and valves are working. This procedure takes approximately one hour. There are no restrictions for this procedure. Please do NOT wear cologne, perfume, aftershave, or lotions (deodorant is allowed). Please arrive 15 minutes prior to your appointment time.  Please note: We ask at that you not bring children with you during ultrasound (echo/ vascular) testing. Due to room size and safety concerns, children are not allowed in the ultrasound rooms during exams. Our front office staff cannot provide observation of children in our lobby area while testing is being conducted. An adult accompanying a patient to their appointment will only be allowed in the ultrasound room at the discretion of the ultrasound technician under special circumstances. We apologize for any inconvenience.   Follow-Up: At [facility], you and your health needs are our priority.  As part of our continuing mission to provide you with exceptional heart care, our  providers are all part of one team.  This team includes your primary Cardiologist (physician) and Advanced Practice Providers or APPs (Physician Assistants and Nurse Practitioners) who all work together to provide you with the care you need, when you need it.  Your next appointment:   1 year(s)  Provider:   Redell Leiter, MD    We recommend signing up for the patient portal called MyChart.  Sign up information is provided on this After Visit Summary.  MyChart is used to connect with patients for Virtual Visits (Telemedicine).  Patients are able to view lab/test results, encounter notes, upcoming appointments, etc.  Non-urgent messages can be sent to your provider as well.   To learn more about what you can do with MyChart, go to [email or web address].   Other Instructions None

## 2024-02-23 LAB — CBC
Hematocrit: 39 % (ref 34.0–46.6)
Hemoglobin: 13 g/dL (ref 11.1–15.9)
MCH: 32.8 pg (ref 26.6–33.0)
MCHC: 33.3 g/dL (ref 31.5–35.7)
MCV: 99 fL — ABNORMAL HIGH (ref 79–97)
Platelets: 173 x10E3/uL (ref 150–450)
RBC: 3.96 x10E6/uL (ref 3.77–5.28)
RDW: 14.3 % (ref 11.7–15.4)
WBC: 7.3 x10E3/uL (ref 3.4–10.8)

## 2024-02-23 LAB — BASIC METABOLIC PANEL WITH GFR
BUN/Creatinine Ratio: 16 (ref 12–28)
BUN: 19 mg/dL (ref 8–27)
CO2: 22 mmol/L (ref 20–29)
Calcium: 8.8 mg/dL (ref 8.7–10.3)
Chloride: 104 mmol/L (ref 96–106)
Creatinine, Ser: 1.19 mg/dL — ABNORMAL HIGH (ref 0.57–1.00)
Glucose: 99 mg/dL (ref 70–99)
Potassium: 4 mmol/L (ref 3.5–5.2)
Sodium: 143 mmol/L (ref 134–144)
eGFR: 45 mL/min/1.73 — ABNORMAL LOW

## 2024-02-26 ENCOUNTER — Ambulatory Visit: Payer: Self-pay | Admitting: Cardiology

## 2024-03-13 ENCOUNTER — Ambulatory Visit: Attending: Cardiology

## 2024-03-13 DIAGNOSIS — I2510 Atherosclerotic heart disease of native coronary artery without angina pectoris: Secondary | ICD-10-CM | POA: Diagnosis not present

## 2024-03-13 DIAGNOSIS — Z9889 Other specified postprocedural states: Secondary | ICD-10-CM

## 2024-03-13 DIAGNOSIS — I482 Chronic atrial fibrillation, unspecified: Secondary | ICD-10-CM | POA: Diagnosis not present

## 2024-03-13 DIAGNOSIS — D6859 Other primary thrombophilia: Secondary | ICD-10-CM | POA: Diagnosis not present

## 2024-03-13 DIAGNOSIS — Z9861 Coronary angioplasty status: Secondary | ICD-10-CM | POA: Diagnosis not present

## 2024-03-13 DIAGNOSIS — Z95818 Presence of other cardiac implants and grafts: Secondary | ICD-10-CM | POA: Diagnosis not present

## 2024-03-14 LAB — ECHOCARDIOGRAM COMPLETE
AR max vel: 1.54 cm2
AV Area VTI: 1.53 cm2
AV Area mean vel: 1.49 cm2
AV Mean grad: 3.7 mmHg
AV Peak grad: 7.1 mmHg
Ao pk vel: 1.33 m/s
Area-P 1/2: 2.56 cm2
MV M vel: 5.35 m/s
MV Peak grad: 114.3 mmHg
MV VTI: 0.8 cm2
P 1/2 time: 596 ms
Radius: 0.5 cm
S' Lateral: 2.9 cm

## 2024-03-14 NOTE — Progress Notes (Signed)
 Left message for the patient to call back.

## 2024-03-17 DIAGNOSIS — D638 Anemia in other chronic diseases classified elsewhere: Secondary | ICD-10-CM | POA: Diagnosis not present

## 2024-03-17 DIAGNOSIS — E785 Hyperlipidemia, unspecified: Secondary | ICD-10-CM | POA: Diagnosis not present

## 2024-03-17 DIAGNOSIS — R7301 Impaired fasting glucose: Secondary | ICD-10-CM | POA: Diagnosis not present

## 2024-03-17 DIAGNOSIS — G8194 Hemiplegia, unspecified affecting left nondominant side: Secondary | ICD-10-CM | POA: Diagnosis not present

## 2024-03-17 DIAGNOSIS — Z0000 Encounter for general adult medical examination without abnormal findings: Secondary | ICD-10-CM | POA: Diagnosis not present

## 2024-03-17 DIAGNOSIS — Z79899 Other long term (current) drug therapy: Secondary | ICD-10-CM | POA: Diagnosis not present

## 2024-03-17 DIAGNOSIS — I10 Essential (primary) hypertension: Secondary | ICD-10-CM | POA: Diagnosis not present

## 2024-03-17 DIAGNOSIS — I639 Cerebral infarction, unspecified: Secondary | ICD-10-CM | POA: Diagnosis not present

## 2024-03-17 DIAGNOSIS — I480 Paroxysmal atrial fibrillation: Secondary | ICD-10-CM | POA: Diagnosis not present

## 2024-03-17 DIAGNOSIS — N183 Chronic kidney disease, stage 3 unspecified: Secondary | ICD-10-CM | POA: Diagnosis not present

## 2024-03-17 DIAGNOSIS — I5032 Chronic diastolic (congestive) heart failure: Secondary | ICD-10-CM | POA: Diagnosis not present

## 2024-03-17 DIAGNOSIS — Z9181 History of falling: Secondary | ICD-10-CM | POA: Diagnosis not present

## 2024-03-17 DIAGNOSIS — M109 Gout, unspecified: Secondary | ICD-10-CM | POA: Diagnosis not present

## 2024-05-15 ENCOUNTER — Encounter (INDEPENDENT_AMBULATORY_CARE_PROVIDER_SITE_OTHER): Admitting: Ophthalmology

## 2024-06-05 ENCOUNTER — Encounter (INDEPENDENT_AMBULATORY_CARE_PROVIDER_SITE_OTHER): Admitting: Ophthalmology

## 2024-06-05 DIAGNOSIS — H43813 Vitreous degeneration, bilateral: Secondary | ICD-10-CM | POA: Diagnosis not present

## 2024-06-05 DIAGNOSIS — I10 Essential (primary) hypertension: Secondary | ICD-10-CM | POA: Diagnosis not present

## 2024-06-05 DIAGNOSIS — H353112 Nonexudative age-related macular degeneration, right eye, intermediate dry stage: Secondary | ICD-10-CM | POA: Diagnosis not present

## 2024-06-05 DIAGNOSIS — H348312 Tributary (branch) retinal vein occlusion, right eye, stable: Secondary | ICD-10-CM

## 2024-06-05 DIAGNOSIS — H35033 Hypertensive retinopathy, bilateral: Secondary | ICD-10-CM | POA: Diagnosis not present

## 2024-06-05 DIAGNOSIS — H353221 Exudative age-related macular degeneration, left eye, with active choroidal neovascularization: Secondary | ICD-10-CM | POA: Diagnosis not present

## 2024-11-06 ENCOUNTER — Encounter (INDEPENDENT_AMBULATORY_CARE_PROVIDER_SITE_OTHER): Admitting: Ophthalmology
